# Patient Record
Sex: Male | Born: 1955 | Race: White | Hispanic: No | Marital: Married | Smoking: Never smoker
Health system: Southern US, Community
[De-identification: ages and names within clinical notes are randomized; demographics above are authoritative.]

## PROBLEM LIST (undated history)

## (undated) DIAGNOSIS — K5289 Other specified noninfective gastroenteritis and colitis: Secondary | ICD-10-CM

## (undated) DIAGNOSIS — N289 Disorder of kidney and ureter, unspecified: Secondary | ICD-10-CM

## (undated) DIAGNOSIS — M722 Plantar fascial fibromatosis: Secondary | ICD-10-CM

## (undated) DIAGNOSIS — E785 Hyperlipidemia, unspecified: Secondary | ICD-10-CM

## (undated) DIAGNOSIS — I10 Essential (primary) hypertension: Secondary | ICD-10-CM

## (undated) DIAGNOSIS — N529 Male erectile dysfunction, unspecified: Secondary | ICD-10-CM

## (undated) DIAGNOSIS — G5603 Carpal tunnel syndrome, bilateral upper limbs: Secondary | ICD-10-CM

## (undated) DIAGNOSIS — R55 Syncope and collapse: Secondary | ICD-10-CM

## (undated) DIAGNOSIS — N189 Chronic kidney disease, unspecified: Secondary | ICD-10-CM

## (undated) DIAGNOSIS — C649 Malignant neoplasm of unspecified kidney, except renal pelvis: Secondary | ICD-10-CM

## (undated) DIAGNOSIS — H269 Unspecified cataract: Secondary | ICD-10-CM

## (undated) DIAGNOSIS — M19019 Primary osteoarthritis, unspecified shoulder: Secondary | ICD-10-CM

## (undated) DIAGNOSIS — Z905 Acquired absence of kidney: Secondary | ICD-10-CM

## (undated) HISTORY — DX: Plantar fascial fibromatosis: M72.2

## (undated) HISTORY — DX: Male erectile dysfunction, unspecified: N52.9

## (undated) HISTORY — DX: Unspecified cataract: H26.9

## (undated) HISTORY — DX: Malignant neoplasm of unspecified kidney, except renal pelvis: C64.9

## (undated) HISTORY — DX: Chronic kidney disease, unspecified: N18.9

## (undated) HISTORY — DX: Carpal tunnel syndrome, bilateral upper limbs: G56.03

## (undated) HISTORY — DX: Hyperlipidemia, unspecified: E78.5

## (undated) HISTORY — DX: Disorder of kidney and ureter, unspecified: N28.9

## (undated) HISTORY — PX: NEPHRECTOMY: SHX65

## (undated) HISTORY — PX: OTHER SURGICAL HISTORY: SHX169

## (undated) HISTORY — DX: Essential (primary) hypertension: I10

## (undated) HISTORY — PX: VASECTOMY: SHX75

## (undated) HISTORY — DX: Acquired absence of kidney: Z90.5

## (undated) HISTORY — DX: Syncope and collapse: R55

## (undated) HISTORY — DX: Other specified noninfective gastroenteritis and colitis: K52.89

## (undated) HISTORY — DX: Primary osteoarthritis, unspecified shoulder: M19.019

---

## 2002-11-13 ENCOUNTER — Inpatient Hospital Stay (HOSPITAL_COMMUNITY): Admission: RE | Admit: 2002-11-13 | Discharge: 2002-11-18 | Payer: Self-pay | Admitting: Urology

## 2002-11-13 ENCOUNTER — Encounter: Payer: Self-pay | Admitting: Urology

## 2002-11-13 ENCOUNTER — Encounter (INDEPENDENT_AMBULATORY_CARE_PROVIDER_SITE_OTHER): Payer: Self-pay | Admitting: *Deleted

## 2002-11-14 ENCOUNTER — Encounter: Payer: Self-pay | Admitting: Urology

## 2002-11-16 ENCOUNTER — Encounter: Payer: Self-pay | Admitting: Urology

## 2002-12-30 ENCOUNTER — Encounter: Payer: Self-pay | Admitting: Urology

## 2002-12-30 ENCOUNTER — Encounter: Admission: RE | Admit: 2002-12-30 | Discharge: 2002-12-30 | Payer: Self-pay | Admitting: Urology

## 2004-10-17 ENCOUNTER — Ambulatory Visit: Payer: Self-pay | Admitting: Internal Medicine

## 2004-10-20 ENCOUNTER — Ambulatory Visit: Payer: Self-pay | Admitting: Internal Medicine

## 2004-12-25 ENCOUNTER — Ambulatory Visit: Payer: Self-pay | Admitting: Internal Medicine

## 2005-10-16 ENCOUNTER — Ambulatory Visit: Payer: Self-pay | Admitting: Internal Medicine

## 2005-10-22 ENCOUNTER — Ambulatory Visit: Payer: Self-pay | Admitting: Internal Medicine

## 2006-10-15 ENCOUNTER — Ambulatory Visit: Payer: Self-pay | Admitting: Internal Medicine

## 2006-10-15 LAB — CONVERTED CEMR LAB
AST: 22 units/L (ref 0–37)
BUN: 14 mg/dL (ref 6–23)
Basophils Absolute: 0.1 10*3/uL (ref 0.0–0.1)
Basophils Relative: 1 % (ref 0.0–1.0)
Bilirubin Urine: NEGATIVE
Bilirubin, Direct: 0.1 mg/dL (ref 0.0–0.3)
CO2: 31 meq/L (ref 19–32)
Chloride: 105 meq/L (ref 96–112)
Cholesterol: 188 mg/dL (ref 0–200)
Creatinine, Ser: 1.3 mg/dL (ref 0.4–1.5)
Eosinophils Absolute: 0.1 10*3/uL (ref 0.0–0.6)
Eosinophils Relative: 2.2 % (ref 0.0–5.0)
GFR calc Af Amer: 75 mL/min
GFR calc non Af Amer: 62 mL/min
Glucose, Bld: 100 mg/dL — ABNORMAL HIGH (ref 70–99)
HCT: 45.2 % (ref 39.0–52.0)
HDL: 35.5 mg/dL — ABNORMAL LOW (ref >39.0)
Hemoglobin, Urine: NEGATIVE
Hemoglobin: 15.4 g/dL (ref 13.0–17.0)
Ketones, ur: NEGATIVE mg/dL
Leukocytes, UA: NEGATIVE
Lymphocytes Relative: 35.5 % (ref 12.0–46.0)
MCHC: 34.1 g/dL (ref 30.0–36.0)
MCV: 87.6 fL (ref 78.0–100.0)
Monocytes Absolute: 0.6 10*3/uL (ref 0.2–0.7)
Monocytes Relative: 9.7 % (ref 3.0–11.0)
Neutro Abs: 2.9 10*3/uL (ref 1.4–7.7)
Neutrophils Relative %: 51.6 % (ref 43.0–77.0)
Nitrite: NEGATIVE
PSA: 1.34 ng/mL (ref 0.10–4.00)
Platelets: 263 10*3/uL (ref 150–400)
Potassium: 3.9 meq/L (ref 3.5–5.1)
RDW: 12.1 % (ref 11.5–14.6)
Sodium: 141 meq/L (ref 135–145)
Specific Gravity, Urine: >=1.03 (ref 1.000–1.03)
TSH: 2.52 microintl units/mL (ref 0.35–5.50)
Total Bilirubin: 0.8 mg/dL (ref 0.3–1.2)
Total Protein, Urine: NEGATIVE mg/dL
Total Protein: 6.7 g/dL (ref 6.0–8.3)
Triglycerides: 182 mg/dL — ABNORMAL HIGH (ref 0–149)
Urine Glucose: NEGATIVE mg/dL
Urobilinogen, UA: 0.2 (ref 0.0–1.0)
VLDL: 36 mg/dL (ref 0–40)

## 2006-10-23 ENCOUNTER — Ambulatory Visit: Payer: Self-pay | Admitting: Internal Medicine

## 2007-06-24 ENCOUNTER — Encounter: Payer: Self-pay | Admitting: Internal Medicine

## 2007-06-24 DIAGNOSIS — F528 Other sexual dysfunction not due to a substance or known physiological condition: Secondary | ICD-10-CM | POA: Insufficient documentation

## 2007-06-24 DIAGNOSIS — E7800 Pure hypercholesterolemia, unspecified: Secondary | ICD-10-CM | POA: Insufficient documentation

## 2007-06-24 DIAGNOSIS — G5600 Carpal tunnel syndrome, unspecified upper limb: Secondary | ICD-10-CM

## 2007-09-22 ENCOUNTER — Encounter: Payer: Self-pay | Admitting: Internal Medicine

## 2007-10-20 ENCOUNTER — Ambulatory Visit: Payer: Self-pay | Admitting: Internal Medicine

## 2007-10-20 LAB — CONVERTED CEMR LAB
ALT: 22 units/L (ref 0–53)
AST: 23 units/L (ref 0–37)
Albumin: 4.1 g/dL (ref 3.5–5.2)
Alkaline Phosphatase: 80 units/L (ref 39–117)
BUN: 17 mg/dL (ref 6–23)
Basophils Absolute: 0.1 10*3/uL (ref 0.0–0.1)
Basophils Relative: 0.9 % (ref 0.0–1.0)
Bilirubin Urine: NEGATIVE
Bilirubin, Direct: 0.1 mg/dL (ref 0.0–0.3)
CO2: 31 meq/L (ref 19–32)
Calcium: 9.5 mg/dL (ref 8.4–10.5)
Chloride: 103 meq/L (ref 96–112)
Cholesterol: 196 mg/dL (ref 0–200)
Creatinine, Ser: 1.4 mg/dL (ref 0.4–1.5)
Direct LDL: 128.3 mg/dL
Eosinophils Absolute: 0.1 10*3/uL (ref 0.0–0.6)
Eosinophils Relative: 2.1 % (ref 0.0–5.0)
GFR calc Af Amer: 69 mL/min
GFR calc non Af Amer: 57 mL/min
Glucose, Bld: 104 mg/dL — ABNORMAL HIGH (ref 70–99)
HCT: 45.4 % (ref 39.0–52.0)
HDL: 32.5 mg/dL — ABNORMAL LOW (ref >39.0)
Hemoglobin, Urine: NEGATIVE
Hemoglobin: 15.2 g/dL (ref 13.0–17.0)
Ketones, ur: NEGATIVE mg/dL
Leukocytes, UA: NEGATIVE
Lymphocytes Relative: 23.5 % (ref 12.0–46.0)
MCHC: 33.5 g/dL (ref 30.0–36.0)
MCV: 88.4 fL (ref 78.0–100.0)
Monocytes Absolute: 0.7 10*3/uL (ref 0.2–0.7)
Monocytes Relative: 9.6 % (ref 3.0–11.0)
Neutro Abs: 4.5 10*3/uL (ref 1.4–7.7)
Neutrophils Relative %: 63.9 % (ref 43.0–77.0)
Nitrite: NEGATIVE
PSA: 1.49 ng/mL (ref 0.10–4.00)
Platelets: 257 10*3/uL (ref 150–400)
Potassium: 4.2 meq/L (ref 3.5–5.1)
RBC: 5.13 M/uL (ref 4.22–5.81)
RDW: 12 % (ref 11.5–14.6)
Sodium: 141 meq/L (ref 135–145)
Specific Gravity, Urine: >=1.03 (ref 1.000–1.03)
TSH: 1.86 microintl units/mL (ref 0.35–5.50)
Total Bilirubin: 0.9 mg/dL (ref 0.3–1.2)
Total CHOL/HDL Ratio: 6
Total Protein, Urine: NEGATIVE mg/dL
Total Protein: 7 g/dL (ref 6.0–8.3)
Triglycerides: 202 mg/dL (ref 0–149)
Urine Glucose: NEGATIVE mg/dL
Urobilinogen, UA: 0.2 (ref 0.0–1.0)
VLDL: 40 mg/dL (ref 0–40)
WBC: 7.1 10*3/uL (ref 4.5–10.5)
pH: 6 (ref 5.0–8.0)

## 2007-10-27 ENCOUNTER — Ambulatory Visit: Payer: Self-pay | Admitting: Internal Medicine

## 2007-10-27 DIAGNOSIS — N259 Disorder resulting from impaired renal tubular function, unspecified: Secondary | ICD-10-CM | POA: Insufficient documentation

## 2007-10-27 DIAGNOSIS — M25519 Pain in unspecified shoulder: Secondary | ICD-10-CM

## 2007-10-27 DIAGNOSIS — E785 Hyperlipidemia, unspecified: Secondary | ICD-10-CM

## 2007-10-27 DIAGNOSIS — I10 Essential (primary) hypertension: Secondary | ICD-10-CM | POA: Insufficient documentation

## 2007-10-28 ENCOUNTER — Encounter (INDEPENDENT_AMBULATORY_CARE_PROVIDER_SITE_OTHER): Payer: Self-pay | Admitting: *Deleted

## 2007-11-03 ENCOUNTER — Encounter: Payer: Self-pay | Admitting: Internal Medicine

## 2007-11-26 ENCOUNTER — Ambulatory Visit: Payer: Self-pay | Admitting: Gastroenterology

## 2007-11-28 ENCOUNTER — Ambulatory Visit (HOSPITAL_BASED_OUTPATIENT_CLINIC_OR_DEPARTMENT_OTHER): Admission: RE | Admit: 2007-11-28 | Discharge: 2007-11-28 | Payer: Self-pay | Admitting: Orthopedic Surgery

## 2007-12-10 LAB — HM COLONOSCOPY: HM Colonoscopy: ABNORMAL

## 2007-12-15 ENCOUNTER — Encounter: Payer: Self-pay | Admitting: Internal Medicine

## 2007-12-15 ENCOUNTER — Encounter: Payer: Self-pay | Admitting: Gastroenterology

## 2007-12-15 ENCOUNTER — Ambulatory Visit: Payer: Self-pay | Admitting: Gastroenterology

## 2007-12-17 ENCOUNTER — Encounter: Payer: Self-pay | Admitting: Internal Medicine

## 2007-12-17 DIAGNOSIS — K5289 Other specified noninfective gastroenteritis and colitis: Secondary | ICD-10-CM

## 2007-12-17 HISTORY — DX: Other specified noninfective gastroenteritis and colitis: K52.89

## 2008-04-21 ENCOUNTER — Telehealth (INDEPENDENT_AMBULATORY_CARE_PROVIDER_SITE_OTHER): Payer: Self-pay | Admitting: *Deleted

## 2008-10-22 ENCOUNTER — Ambulatory Visit: Payer: Self-pay | Admitting: Internal Medicine

## 2008-10-22 LAB — CONVERTED CEMR LAB
Albumin: 3.9 g/dL (ref 3.5–5.2)
Alkaline Phosphatase: 86 units/L (ref 39–117)
BUN: 18 mg/dL (ref 6–23)
Basophils Absolute: 0.1 10*3/uL (ref 0.0–0.1)
Basophils Relative: 0.7 % (ref 0.0–3.0)
Bilirubin Urine: NEGATIVE
CO2: 31 meq/L (ref 19–32)
Calcium: 9.4 mg/dL (ref 8.4–10.5)
Chloride: 102 meq/L (ref 96–112)
Cholesterol: 151 mg/dL (ref 0–200)
Creatinine, Ser: 1.3 mg/dL (ref 0.4–1.5)
Eosinophils Absolute: 0.2 10*3/uL (ref 0.0–0.7)
Eosinophils Relative: 2.1 % (ref 0.0–5.0)
GFR calc Af Amer: 75 mL/min
GFR calc non Af Amer: 62 mL/min
Glucose, Bld: 101 mg/dL — ABNORMAL HIGH (ref 70–99)
HCT: 43.4 % (ref 39.0–52.0)
HDL: 36.2 mg/dL — ABNORMAL LOW (ref >39.0)
Hemoglobin, Urine: NEGATIVE
Hemoglobin: 15 g/dL (ref 13.0–17.0)
Ketones, ur: NEGATIVE mg/dL
LDL Cholesterol: 84 mg/dL (ref 0–99)
Leukocytes, UA: NEGATIVE
MCHC: 34.7 g/dL (ref 30.0–36.0)
MCV: 88.2 fL (ref 78.0–100.0)
Monocytes Absolute: 0.8 10*3/uL (ref 0.1–1.0)
Neutro Abs: 4.8 10*3/uL (ref 1.4–7.7)
Nitrite: NEGATIVE
PSA: 1.16 ng/mL (ref 0.10–4.00)
Platelets: 243 10*3/uL (ref 150–400)
Potassium: 4.1 meq/L (ref 3.5–5.1)
RBC: 4.92 M/uL (ref 4.22–5.81)
RDW: 12.1 % (ref 11.5–14.6)
Sodium: 140 meq/L (ref 135–145)
Specific Gravity, Urine: >=1.03 (ref 1.000–1.03)
TSH: 2.21 microintl units/mL (ref 0.35–5.50)
Total Bilirubin: 1.1 mg/dL (ref 0.3–1.2)
Total Protein: 6.9 g/dL (ref 6.0–8.3)
Triglycerides: 153 mg/dL — ABNORMAL HIGH (ref 0–149)
Urine Glucose: NEGATIVE mg/dL
Urobilinogen, UA: 0.2 (ref 0.0–1.0)
VLDL: 31 mg/dL (ref 0–40)
WBC: 7.6 10*3/uL (ref 4.5–10.5)
pH: 5 (ref 5.0–8.0)

## 2008-10-28 ENCOUNTER — Ambulatory Visit: Payer: Self-pay | Admitting: Internal Medicine

## 2008-10-28 DIAGNOSIS — C649 Malignant neoplasm of unspecified kidney, except renal pelvis: Secondary | ICD-10-CM

## 2009-10-27 ENCOUNTER — Ambulatory Visit: Payer: Self-pay | Admitting: Internal Medicine

## 2009-10-27 LAB — CONVERTED CEMR LAB
ALT: 22 units/L (ref 0–53)
AST: 23 units/L (ref 0–37)
Albumin: 4.1 g/dL (ref 3.5–5.2)
Alkaline Phosphatase: 84 units/L (ref 39–117)
BUN: 15 mg/dL (ref 6–23)
Basophils Absolute: 0 10*3/uL (ref 0.0–0.1)
Basophils Relative: 0.7 % (ref 0.0–3.0)
Bilirubin Urine: NEGATIVE
Bilirubin, Direct: 0.1 mg/dL (ref 0.0–0.3)
CO2: 32 meq/L (ref 19–32)
Calcium: 9.4 mg/dL (ref 8.4–10.5)
Chloride: 104 meq/L (ref 96–112)
Cholesterol: 156 mg/dL (ref 0–200)
Creatinine, Ser: 1.3 mg/dL (ref 0.4–1.5)
Direct LDL: 90.2 mg/dL
Eosinophils Absolute: 0.2 10*3/uL (ref 0.0–0.7)
Eosinophils Relative: 2.9 % (ref 0.0–5.0)
GFR calc non Af Amer: 61.58 mL/min (ref >60)
Glucose, Bld: 95 mg/dL (ref 70–99)
HCT: 46.2 % (ref 39.0–52.0)
HDL: 38.2 mg/dL — ABNORMAL LOW (ref >39.00)
Hemoglobin, Urine: NEGATIVE
Hemoglobin: 15.5 g/dL (ref 13.0–17.0)
Ketones, ur: NEGATIVE mg/dL
Leukocytes, UA: NEGATIVE
Lymphocytes Relative: 32.3 % (ref 12.0–46.0)
Lymphs Abs: 1.9 10*3/uL (ref 0.7–4.0)
MCHC: 33.5 g/dL (ref 30.0–36.0)
MCV: 90.1 fL (ref 78.0–100.0)
Monocytes Absolute: 0.6 10*3/uL (ref 0.1–1.0)
Monocytes Relative: 10.6 % (ref 3.0–12.0)
Neutro Abs: 3.1 10*3/uL (ref 1.4–7.7)
Neutrophils Relative %: 53.5 % (ref 43.0–77.0)
Nitrite: NEGATIVE
PSA: 1.37 ng/mL (ref 0.10–4.00)
Platelets: 240 10*3/uL (ref 150.0–400.0)
Potassium: 4 meq/L (ref 3.5–5.1)
RBC: 5.13 M/uL (ref 4.22–5.81)
RDW: 12.1 % (ref 11.5–14.6)
Sodium: 142 meq/L (ref 135–145)
Specific Gravity, Urine: 1.025 (ref 1.000–1.030)
TSH: 3.05 microintl units/mL (ref 0.35–5.50)
Total Bilirubin: 0.4 mg/dL (ref 0.3–1.2)
Total CHOL/HDL Ratio: 4
Total Protein, Urine: NEGATIVE mg/dL
Total Protein: 7.1 g/dL (ref 6.0–8.3)
Triglycerides: 220 mg/dL — ABNORMAL HIGH (ref 0.0–149.0)
Urine Glucose: NEGATIVE mg/dL
Urobilinogen, UA: 0.2 (ref 0.0–1.0)
VLDL: 44 mg/dL — ABNORMAL HIGH (ref 0.0–40.0)
WBC: 5.8 10*3/uL (ref 4.5–10.5)
pH: 5.5 (ref 5.0–8.0)

## 2009-11-03 ENCOUNTER — Ambulatory Visit: Payer: Self-pay | Admitting: Internal Medicine

## 2010-09-05 ENCOUNTER — Ambulatory Visit
Admission: RE | Admit: 2010-09-05 | Discharge: 2010-09-05 | Payer: Self-pay | Source: Home / Self Care | Attending: Internal Medicine | Admitting: Internal Medicine

## 2010-09-05 ENCOUNTER — Encounter: Payer: Self-pay | Admitting: Internal Medicine

## 2010-09-05 DIAGNOSIS — M722 Plantar fascial fibromatosis: Secondary | ICD-10-CM

## 2010-09-05 DIAGNOSIS — R55 Syncope and collapse: Secondary | ICD-10-CM

## 2010-09-05 DIAGNOSIS — R002 Palpitations: Secondary | ICD-10-CM

## 2010-09-05 HISTORY — DX: Plantar fascial fibromatosis: M72.2

## 2010-09-05 HISTORY — DX: Syncope and collapse: R55

## 2010-09-06 ENCOUNTER — Telehealth (INDEPENDENT_AMBULATORY_CARE_PROVIDER_SITE_OTHER): Payer: Self-pay | Admitting: *Deleted

## 2010-09-13 ENCOUNTER — Ambulatory Visit: Admit: 2010-09-13 | Payer: Self-pay

## 2010-09-14 ENCOUNTER — Ambulatory Visit: Admission: RE | Admit: 2010-09-14 | Discharge: 2010-09-14 | Payer: Self-pay | Source: Home / Self Care

## 2010-09-14 ENCOUNTER — Encounter: Payer: Self-pay | Admitting: Internal Medicine

## 2010-09-19 ENCOUNTER — Telehealth: Payer: Self-pay | Admitting: Internal Medicine

## 2010-09-22 ENCOUNTER — Ambulatory Visit (HOSPITAL_COMMUNITY): Admission: RE | Admit: 2010-09-22 | Payer: Self-pay | Source: Home / Self Care | Admitting: Internal Medicine

## 2010-09-27 ENCOUNTER — Telehealth (INDEPENDENT_AMBULATORY_CARE_PROVIDER_SITE_OTHER): Payer: Self-pay | Admitting: *Deleted

## 2010-09-28 ENCOUNTER — Ambulatory Visit: Admission: RE | Admit: 2010-09-28 | Discharge: 2010-09-28 | Payer: Self-pay | Source: Home / Self Care

## 2010-09-28 ENCOUNTER — Encounter (HOSPITAL_COMMUNITY)
Admission: RE | Admit: 2010-09-28 | Discharge: 2010-10-10 | Payer: Self-pay | Source: Home / Self Care | Attending: Internal Medicine | Admitting: Internal Medicine

## 2010-09-28 ENCOUNTER — Encounter: Payer: Self-pay | Admitting: Internal Medicine

## 2010-09-28 ENCOUNTER — Ambulatory Visit (HOSPITAL_COMMUNITY)
Admission: RE | Admit: 2010-09-28 | Discharge: 2010-09-28 | Payer: Self-pay | Source: Home / Self Care | Attending: Internal Medicine | Admitting: Internal Medicine

## 2010-10-04 ENCOUNTER — Telehealth: Payer: Self-pay | Admitting: Internal Medicine

## 2010-10-10 NOTE — Assessment & Plan Note (Signed)
Summary: CPX/ NWS #   Vital Signs:  Patient profile:   55 year old male Height:      74 inches Weight:      194.75 pounds BMI:     25.09 O2 Sat:      97 % on Room air Temp:     98.5 degrees F oral Pulse rate:   85 / minute BP sitting:   120 / 72  (left arm) Cuff size:   regular  Vitals Entered ByZella Ball Ewing (November 03, 2009 8:42 AM)  O2 Flow:  Room air  Preventive Care Screening  Colonoscopy:    Date:  12/10/2007    Next Due:  12/2017    Results:  abnormal   CC: Adult Physical/RE   CC:  Adult Physical/RE.  History of Present Illness: overall doing well, no complaints, Pt denies CP, sob, doe, wheezing, orthopnea, pnd, worsening LE edema, palps, dizziness or syncope  Pt denies new neuro symptoms such as headache, facial or extremity weakness   no recent wt loss, night sweats.    Preventive Screening-Counseling & Management      Drug Use:  no.    Problems Prior to Update: 1)  Preventive Health Care  (ICD-V70.0) 2)  Renal Cell Cancer  (ICD-189.0) 3)  Colitis  (ICD-558.9) 4)  Carpal Tunnel Syndrome, Right  (ICD-354.0) 5)  Shoulder Pain, Right  (ICD-719.41) 6)  Preventive Health Care  (ICD-V70.0) 7)  Renal Insufficiency  (ICD-588.9) 8)  Hypertension  (ICD-401.9) 9)  Hyperlipidemia  (ICD-272.4) 10)  Observation For Suspected Malignant Neoplasm  (ICD-V71.1) 11)  Routine General Medical Exam@health  Care Facl  (ICD-V70.0) 12)  Carpal Tunnel Syndrome, Bilateral  (ICD-354.0) 13)  Hypercholesterolemia  (ICD-272.0) 14)  Erectile Dysfunction  (ICD-302.72)  Medications Prior to Update: 1)  Viagra 100 Mg  Tabs (Sildenafil Citrate) .Marland Kitchen.. 1 By Mouth Every Other Day As Needed 2)  Cozaar 100 Mg  Tabs (Losartan Potassium) .Marland Kitchen.. 1 By Mouth Once Daily 3)  Pravachol 20 Mg  Tabs (Pravastatin Sodium) .Marland Kitchen.. 1po Once Daily 4)  Ecotrin Low Strength 81 Mg  Tbec (Aspirin) .Marland Kitchen.. 1po Qd  Current Medications (verified): 1)  Viagra 100 Mg  Tabs (Sildenafil Citrate) .Marland Kitchen.. 1 By Mouth Every  Other Day As Needed 2)  Cozaar 100 Mg  Tabs (Losartan Potassium) .Marland Kitchen.. 1 By Mouth Once Daily 3)  Pravachol 20 Mg  Tabs (Pravastatin Sodium) .Marland Kitchen.. 1po Once Daily 4)  Ecotrin Low Strength 81 Mg  Tbec (Aspirin) .Marland Kitchen.. 1po Qd 5)  Fenofibrate 160 Mg Tabs (Fenofibrate) .Marland Kitchen.. 1po Once Daily  Allergies (verified): 1)  ! Pcn  Past History:  Past Medical History: Last updated: 10/28/2008 Hyperlipidemia Hypertension E.D. 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 heart disease grandmother with "liver cancer" father with stroke, TIA,  HTN, and CABG at 54yo  Social History: Reviewed history from 10/27/2007 and no changes required. 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         all otherwise negative per pt -  Physical Exam  General:  alert  and well-developed.   Head:  normocephalic and atraumatic.   Eyes:  vision grossly intact, pupils equal, and pupils round.   Ears:  R ear normal and L ear normal.   Nose:  no external deformity and no nasal discharge.   Mouth:  no gingival abnormalities and pharynx pink and moist.   Neck:  supple and no masses.   Lungs:  normal respiratory effort and normal breath sounds.   Heart:  normal rate and regular rhythm.   Abdomen:  soft, non-tender, and normal bowel sounds.   Msk:  no joint tenderness and no joint swelling.   Extremities:  no edema, no erythema  Neurologic:  cranial nerves II-XII intact and strength normal in all extremities.   Skin:  color normal and no rashes.   Psych:  not anxious appearing and not depressed appearing.     Impression & Recommendations:  Problem # 1:  Preventive Health Care (ICD-V70.0)  Overall doing well, age appropriate education and counseling updated and referral for appropriate preventive services done unless declined, immunizations up to date or declined, diet counseling done if overweight, urged to quit smoking if smokes , most recent labs reviewed and current ordered if appropriate, ecg reviewed or declined (interpretation per ECG scanned in the EMR if done); information regarding Medicare Prevention requirements given if appropriate   Orders: EKG w/ Interpretation (93000)  Problem # 2:  HYPERLIPIDEMIA (ICD-272.4)  His updated medication list for this problem includes:    Pravachol 20 Mg Tabs (Pravastatin sodium) .Marland Kitchen... 1po once daily    Fenofibrate 160 Mg Tabs (Fenofibrate) .Marland Kitchen... 1po once daily to add the generic tricor  Labs Reviewed: SGOT: 23 (10/27/2009)   SGPT: 22 (10/27/2009)   HDL:38.20 (10/27/2009), 36.2 (10/22/2008)  LDL:84 (10/22/2008), DEL (60/45/4098)  Chol:156 (10/27/2009), 151 (10/22/2008)  Trig:220.0 (10/27/2009), 153 (10/22/2008)  Problem # 3:  RENAL CELL CANCER (ICD-189.0)  for f/u cxr today  Orders: T-2 View CXR, Same Day  (71020.5TC)  Complete Medication List: 1)  Viagra 100 Mg Tabs (Sildenafil citrate) .Marland Kitchen.. 1 by mouth every other day as needed 2)  Cozaar 100 Mg Tabs (Losartan potassium) .Marland Kitchen.. 1 by mouth once daily 3)  Pravachol 20 Mg Tabs (Pravastatin sodium) .Marland Kitchen.. 1po once daily 4)  Ecotrin Low Strength 81 Mg Tbec (Aspirin) .Marland Kitchen.. 1po qd 5)  Fenofibrate 160 Mg Tabs (Fenofibrate) .Marland Kitchen.. 1po once daily  Patient Instructions: 1)  Please take all new medications as prescribed 2)  Continue all previous medications as before this visit  3)  Please go to Radiology in the basement level for your X-Ray today  4)  Please schedule a follow-up appointment in 1 year or sooner if needed Prescriptions: VIAGRA 100 MG  TABS (SILDENAFIL CITRATE) 1 by mouth every other day as needed  #5 x 11   Entered and Authorized by:   Corwin Levins MD   Signed by:   Corwin Levins MD on 11/03/2009   Method used:   Print then Give to Patient   RxID:   [id number] MVHQIO  100 MG  TABS (LOSARTAN POTASSIUM) 1 by mouth once daily  #90 x 3   Entered and Authorized by:   Corwin Levins MD   Signed by:   Corwin Levins MD on 11/03/2009   Method used:   Print then Give to Patient   RxID:   [id number] PRAVACHOL 20 MG  TABS (PRAVASTATIN SODIUM) 1po once daily  #90 x 3   Entered and Authorized by:   Corwin Levins MD   Signed by:   Corwin Levins MD on 11/03/2009   Method used:   Print then Give to Patient   RxID:   [id number] FENOFIBRATE 160 MG TABS (FENOFIBRATE) 1po once daily  #90 x 3   Entered and Authorized by:   Corwin Levins MD   Signed by:   Corwin Levins MD on 11/03/2009   Method used:   Print then Give to Patient   RxID:   [id number]

## 2010-10-12 NOTE — Progress Notes (Signed)
Summary: Nuclear Pre-Procedure  Phone Note Outgoing Call Call back at [facility] Phone [phone]   Call placed by: Stanton Kidney, EMT-P,  September 27, 2010 3:42 PM Action Taken: Phone Call Completed Summary of Call: Left message with information on Myoview Information Sheet (see scanned document for details). Stanton Kidney, EMT-P  September 27, 2010 3:42 PM     Nuclear Med Background Indications for Stress Test: Evaluation for Ischemia   History: Myocardial Perfusion Study  History Comments: '05 MPS: NL, EF=55%  Symptoms: Fatigue, Palpitations, Syncope    Nuclear Pre-Procedure Cardiac Risk Factors: Family History - CAD, History of Smoking, Hypertension, Lipids Height (in): 74

## 2010-10-12 NOTE — Assessment & Plan Note (Addendum)
Summary: Cardiology Nuclear Testing  Nuclear Med Background Indications for Stress Test: Evaluation for Ischemia   History: Myocardial Perfusion Study  History Comments: '05 ZOX:WRUEAV, EF=55%  Symptoms: Fatigue, Palpitations, Syncope  Symptoms Comments: Stress. 7/10 Episode of syncope, "related to BP meds." per patient.   Nuclear Pre-Procedure Cardiac Risk Factors: Family History - CAD, History of Smoking, Hypertension, Lipids Caffeine/Decaff Intake: None NPO After: 7:00 PM Lungs: Clear IV 0.9% NS with Angio Cath: 18g     IV Site: R Antecubital IV Started by: Stanton Kidney, EMT-P Chest Size (in) 44     Height (in): 74 Weight (lb): 192 BMI: 24.74 Tech Comments: Has not started metoprolol yet, per patient.   Nuclear Med Study 1 or 2 day study:  1 day     Stress Test Type:  Stress Reading MD:  Dietrich Pates, MD     Referring MD:  Oliver Barre, MD Resting Radionuclide:  Technetium 37m Tetrofosmin     Resting Radionuclide Dose:  11.0 mCi  Stress Radionuclide:  Technetium 38m Tetrofosmin     Stress Radionuclide Dose:  33.0 mCi   Stress Protocol Exercise Time (min):  10:16 min     Max HR:  176 bpm     Predicted Max HR:  167 bpm  Max Systolic BP: 176 mm Hg     Percent Max HR:  105.39 %     METS: 12.2 Rate Pressure Product:  40981    Stress Test Technologist:  [employer], CMA-N     Nuclear Technologist:  Domenic Polite, CNMT  Rest Procedure  Myocardial perfusion imaging was performed at rest 45 minutes following the intravenous administration of Technetium 102m Tetrofosmin.  Stress Procedure  The patient exercised for 10:16 utilizing the Bruce protocol.  The patient stopped due to fatigue and denied any chest pain.  There were no diagnostic ST-T wave changes, only occasional PVC's and rare PAC's.  Technetium 96m Tetrofosmin was injected at peak exercise and myocardial perfusion imaging was performed after a brief delay.  QPS Raw Data Images:  Images were motion corrected.  SOft  tissue (diaphragm) underlies heart. Stress Images:  thinning with decreased counts in the inferiro wall(base, mid).  Otherwise normal perfusion. Rest Images:  Comparison with the stress images reveals no significant change. Subtraction (SDS):  No evidence of ischemia. Transient Ischemic Dilatation:  1.08  (Normal <1.22)  Lung/Heart Ratio:  0.30  (Normal <0.45)  Quantitative Gated Spect Images QGS EDV:  105 ml QGS ESV:  50 ml QGS EF:  53 %   Overall Impression  Exercise Capacity: Excellent exercise capacity. BP Response: Normal blood pressure response. Clinical Symptoms: No chest pain ECG Impression: No significant ST segment change suggestive of ischemia. Overall Impression Comments: Thinning in the inferior wall (base,  mid) consistent with possible soft tissue attenuation though cannot exclude scar.  LVEF calculated at 53%.  No evidence of ischemia.  Would recomm echo to further define wall motion inferiorly.  Overall with patient's exercise capacity very low risk scan.  Appended Document: Cardiology Nuclear Testing Low risk scan  See note above in impression.

## 2010-10-12 NOTE — Progress Notes (Signed)
Summary: holter monitor   Phone Note Outgoing Call   Call placed by: Marcos Eke,  September 06, 2010 11:31 AM Summary of Call: Call patient left messege for him to call back to make appt for holter monitor  Follow-up for Phone Call        Patient has appt. for 09/12/10 at 9 am for holter monitor Follow-up by: Marcos Eke,  September 08, 2010 11:20 AM

## 2010-10-12 NOTE — Progress Notes (Signed)
Phone Note Call from Patient Call back at Home Phone [phone]   Caller: Patient Call For: Brandon Sanders Summary of Call: Pt states he saw Dr Jonny Ruiz in December for appt. He set up CPX for April (next available) Pt will run out of all meds before then.Pt also wants stress test done. Please advise/ Initial call taken by: Verdell Face,  September 19, 2010 1:02 PM  Follow-up for Phone Call        ok for med refills - to robin  ok for echo and stress test  with recent syncope Follow-up by: Brandon Sanders,  September 19, 2010 2:57 PM  Additional Follow-up for Phone Call Additional follow up Details #1::        called pt left msg. to call back Additional Follow-up by: Robin Ewing CMA Duncan Dull),  September 19, 2010 3:58 PM    Additional Follow-up for Phone Call Additional follow up Details #2::    Called pt. informed of above information. Sent refills to [facility] as pt. requested, Viagra refill pt. requested sent to [facility]. Also pt. requested his contact number be changed to [phone].  Follow-up by: Zella Ball Ewing CMA (AAMA),  September 20, 2010 8:28 AM  Prescriptions: VIAGRA 100 MG  TABS (SILDENAFIL CITRATE) 1 by mouth every other day as needed  #5 x 11   Entered by:   Zella Ball Ewing CMA (AAMA)   Authorized by:   Brandon Sanders   Signed by:   Scharlene Gloss CMA (AAMA) on 09/20/2010   Method used:   Electronically to        [id number] (retail)       [address]       [place], [place]  [address]       Ph: [phone] or [phone]       Fax: [phone]   RxID:   [id number] FENOFIBRATE 160 MG TABS (FENOFIBRATE) 1po once daily  #90 x 3   Entered by:   Scharlene Gloss CMA (AAMA)   Authorized by:   Brandon Sanders   Signed by:   Scharlene Gloss CMA (AAMA) on 09/20/2010   Method used:   Electronically to        [facility] [phone]* (retail)       [address]       [place], [place]  [address]       Ph: [phone] or  [phone]       Fax: [phone]   RxID:   [id number] PRAVACHOL 20 MG  TABS (PRAVASTATIN SODIUM) 1po once daily  #90 x 3   Entered by:   Scharlene Gloss CMA (AAMA)   Authorized by:   Brandon Sanders   Signed by:   Scharlene Gloss CMA (AAMA) on 09/20/2010   Method used:   Electronically to        [facility] [phone]* (retail)       [address]       [place], [place]  [address]       Ph: [phone] or [phone]       Fax: [phone]   RxID:   [id number] METOPROLOL SUCCINATE 50 MG XR24H-TAB (METOPROLOL SUCCINATE) 1 by mouth once daily  #90 x 3   Entered by:   Scharlene Gloss CMA (AAMA)   Authorized by:   Brandon Sanders   Signed by:  Robin Ewing CMA (AAMA) on 09/20/2010   Method used:   [facility]ically to        [facility]       [address]       [place], [place]  [address]       Ph: [phone] or [phone]       Fax: [phone]   RxID:   [id number]

## 2010-10-12 NOTE — Progress Notes (Signed)
Summary: Echo and Stress test  Phone Note Call from Patient Call back at Work Phone [phone]   Caller: Patient Call For: Corwin Levins MD Summary of Call: Patient called requesting results from Echo and Stress Test as has not heard results. Call back number [phone] Initial call taken by: Robin Ewing CMA Duncan Dull),  October 04, 2010 1:29 PM  Follow-up for Phone Call        both should be on PT - stress test normal, and echo as documented Follow-up by: Corwin Levins MD,  October 04, 2010 1:57 PM  Additional Follow-up for Phone Call Additional follow up Details #1::        called pt. informed of above information Additional Follow-up by: Robin Ewing CMA Duncan Dull),  October 04, 2010 2:09 PM

## 2010-10-12 NOTE — Assessment & Plan Note (Signed)
Summary: FEELS LIKE IRRG HEART BEAT/NWS   Vital Signs:  Patient profile:   55 year old male Height:      74 inches Weight:      194.13 pounds BMI:     25.01 O2 Sat:      98 % on Room air Temp:     98.1 degrees F oral Pulse rate:   75 / minute BP sitting:   136 / 86  (left arm) Cuff size:   regular  Vitals Entered By: Zella Ball Ewing CMA Duncan Dull) (September 05, 2010 9:07 AM)  O2 Flow:  Room air CC: Irregular heartbeat and SOB/RE   CC:  Irregular heartbeat and SOB/RE.  History of Present Illness: here with 6 wks onset palpitations , noted a mised beat per pt every 6 to 12 beats  by radial artery palpation, wife is RN who listened at the same time and heard faint beats  when he noted the missed beats peripherally;  symptoms assoc with sob and cough, and usually occurs with more sendentary activity (with the lower heart rates) and seemed to resolve with begin more active such as light housework/vaccuming yesterday;  Pt denies CP,  doe, wheezing, orthopnea, pnd, worsening LE edema, dizziness or syncope , except for dizziness with the 100 mg cozaar so that he had to begin taking Half in July 2011 after an episode of syncope with BP 90 after 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  BP since then with SBP in the 120's.   Pt denies new neuro symptoms such as headache, facial or extremity weakness . Pt denies polydipsia, polyuria.   No overt bleeding or bruising.   Also with "bottom pad of the right foot swollen" and plantar aspets hurts, worse to first getting up in the AM with first few steps.  Some increased fatigue this yr, more difficutlly gettting to sleep this yr, more job stress this yr as he has taken on some different responsbilities.      Problems Prior to Update: 1)  Plantar Fasciitis  (ICD-728.71) 2)  Syncope  (ICD-780.2) 3)  Palpitations  (ICD-785.1) 4)  Preventive Health Care  (ICD-V70.0) 5)   Renal Cell Cancer  (ICD-189.0) 6)  Colitis  (ICD-558.9) 7)  Carpal Tunnel Syndrome, Right  (ICD-354.0) 8)  Shoulder Pain, Right  (ICD-719.41) 9)  Preventive Health Care  (ICD-V70.0) 10)  Renal Insufficiency  (ICD-588.9) 11)  Hypertension  (ICD-401.9) 12)  Hyperlipidemia  (ICD-272.4) 13)  Observation For Suspected Malignant Neoplasm  (ICD-V71.1) 14)  Routine General Medical Exam@health  Care Facl  (ICD-V70.0) 15)  Carpal Tunnel Syndrome, Bilateral  (ICD-354.0) 16)  Hypercholesterolemia  (ICD-272.0) 17)  Erectile Dysfunction  (ICD-302.72)  Medications Prior to Update: 1)  Viagra 100 Mg  Tabs (Sildenafil Citrate) .Marland Kitchen.. 1 By Mouth Every Other Day As Needed 2)  Cozaar 100 Mg  Tabs (Losartan Potassium) .Marland Kitchen.. 1 By Mouth Once Daily 3)  Pravachol 20 Mg  Tabs (Pravastatin Sodium) .Marland Kitchen.. 1po Once Daily 4)  Ecotrin Low Strength 81 Mg  Tbec (Aspirin) .Marland Kitchen.. 1po Qd 5)  Fenofibrate 160 Mg Tabs (Fenofibrate) .Marland Kitchen.. 1po Once Daily  Current Medications (verified): 1)  Viagra 100 Mg  Tabs (Sildenafil Citrate) .Marland Kitchen.. 1 By Mouth Every Other Day As Needed 2)  Cozaar 100 Mg  Tabs (Losartan Potassium) .... 1/2 By Mouth Once Daily 3)  Pravachol 20 Mg  Tabs (Pravastatin Sodium) .Marland Kitchen.. 1po Once Daily  4)  Ecotrin Low Strength 81 Mg  Tbec (Aspirin) .Marland Kitchen.. 1po Qd 5)  Fenofibrate 160 Mg Tabs (Fenofibrate) .Marland Kitchen.. 1po Once Daily  Allergies (verified): 1)  ! Pcn  Past History:  Past Medical History: Last updated: 10/28/2008 Hyperlipidemia Hypertension E.D. 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   Head:  normocephalic and atraumatic.   Eyes:  vision grossly intact, pupils equal, and pupils round.   Ears:  R ear normal and L ear normal.   Nose:  no external deformity and no nasal discharge.   Mouth:  no gingival abnormalities and pharynx pink and moist.   Neck:  supple and no masses.   Lungs:  normal respiratory effort and normal breath sounds.   Heart:  normal rate and regular rhythm.   Abdomen:  soft, non-tender, and normal bowel sounds.   Msk:  tender heel bilat wtihout sweling or erythema Extremities:  no edema, no erythema    Impression & Recommendations:  Problem # 1:  PALPITATIONS (ICD-785.1)  ? PAC's or PVC's but cant r/o afib';  occurs dialy - will check holter;  consider cardiology;  consider change cozaar to toprol for symptomatic improvement;  labs from feb 2011 reviewed  Orders: Misc. Referral (Misc. Ref)  Problem # 2:  SYNCOPE (ICD-780.2)  likely related to overcontrolled BP, dehydration with episode 6 mo ago;  declines stress test;  will check echo  Orders: Misc. Referral (Misc. Ref)  Problem # 3:  HYPERTENSION (ICD-401.9)  His updated medication list for this problem includes:    Cozaar 100 Mg Tabs (Losartan potassium) .Marland Kitchen... 1/2 by mouth once daily  BP today: 136/86 Prior BP: 120/72 (11/03/2009)  Labs Reviewed: K+: 4.0 (10/27/2009) Creat: : 1.3 (10/27/2009)   Chol: 156 (10/27/2009)   HDL: 38.20 (10/27/2009)   LDL: 84 (10/22/2008)   TG: 220.0 (10/27/2009) stable overall by hx and exam, ok to continue meds/tx as is   Problem # 4:  PLANTAR FASCIITIS (ICD-728.71) mild to mod - for podiatry referral, soft soled shoes, tylenol as needed  Orders: Podiatry Referral (Podiatry)  Complete Medication List: 1)   Viagra 100 Mg Tabs (Sildenafil citrate) .Marland Kitchen.. 1 by mouth every other day as needed 2)  Cozaar 100 Mg Tabs (Losartan potassium) .... 1/2 by mouth once daily 3)  Pravachol 20 Mg Tabs (Pravastatin sodium) .Marland Kitchen.. 1po once daily 4)  Ecotrin Low Strength 81 Mg Tbec (Aspirin) .Marland Kitchen.. 1po qd 5)  Fenofibrate 160 Mg Tabs (Fenofibrate) .Marland Kitchen.. 1po once daily  Other Orders: EKG w/ Interpretation (93000)  Patient Instructions: 1)  You will be contacted about the referral(s) to: 24 hr Holter monitor and echocardiogram, and podiatry for the feet 2)  Please call if you change your mind about having the stress test 3)  Continue all previous medications as before this visit , including the Half of the cozaar 4)  Please schedule a follow-up appointment  in 2 months for CPX with labs 5)  Depending on the holter results, we may be able to change the cozaar to toprol XL to help the symptoms   Orders Added: 1)  EKG w/ Interpretation [93000] 2)  Podiatry Referral [Podiatry] 3)  Misc. Referral [Misc. Ref] 4)  Est. Patient Level IV [[address]]

## 2010-10-12 NOTE — Procedures (Signed)
Summary: summary report  summary report   Imported By: Mirna Mires 09/18/2010 08:50:54  _____________________________________________________________________  External Attachment:    Type:   Image     Comment:   External Document  Appended Document: summary report LMOPT - Holter did show "extra beats" that come from the upper and lower part of the heart, but are benign      1)  if he still has symtpoms and wants, we can try to change the losartan to metoprolol which would treat BP and the extra beats , but if he is ok with the way things are, we can leave his meds alone  robin - to call pt to inform  Appended Document: summary report called pt left msg. to call back  Appended Document: summary report called pt. left msg. to call back  Appended Document: summary report called pt informed of above information. He still is having symptoms and would like to change Meds. as the extra beats does bother him. His pharmacy is [facility].

## 2010-12-12 ENCOUNTER — Other Ambulatory Visit (INDEPENDENT_AMBULATORY_CARE_PROVIDER_SITE_OTHER): Payer: BC Managed Care – PPO

## 2010-12-12 DIAGNOSIS — Z0000 Encounter for general adult medical examination without abnormal findings: Secondary | ICD-10-CM

## 2010-12-12 DIAGNOSIS — Z0389 Encounter for observation for other suspected diseases and conditions ruled out: Secondary | ICD-10-CM

## 2010-12-12 LAB — URINALYSIS
Leukocytes, UA: NEGATIVE
Nitrite: NEGATIVE
Specific Gravity, Urine: 1.025 (ref 1.000–1.030)
Total Protein, Urine: NEGATIVE
pH: 6 (ref 5.0–8.0)

## 2010-12-12 LAB — CBC WITH DIFFERENTIAL/PLATELET
Basophils Absolute: 0 10*3/uL (ref 0.0–0.1)
Eosinophils Relative: 1.9 % (ref 0.0–5.0)
Hemoglobin: 14.3 g/dL (ref 13.0–17.0)
Lymphocytes Relative: 22.2 % (ref 12.0–46.0)
Monocytes Relative: 9.3 % (ref 3.0–12.0)
Neutro Abs: 5.1 10*3/uL (ref 1.4–7.7)
RDW: 13.4 % (ref 11.5–14.6)
WBC: 7.7 10*3/uL (ref 4.5–10.5)

## 2010-12-13 LAB — LIPID PANEL
Cholesterol: 137 mg/dL (ref 0–200)
HDL: 36.5 mg/dL — ABNORMAL LOW (ref >39.00)
Triglycerides: 82 mg/dL (ref 0.0–149.0)
VLDL: 16.4 mg/dL (ref 0.0–40.0)

## 2010-12-13 LAB — HEPATIC FUNCTION PANEL
ALT: 19 U/L (ref 0–53)
Albumin: 4 g/dL (ref 3.5–5.2)
Total Protein: 6.9 g/dL (ref 6.0–8.3)

## 2010-12-13 LAB — BASIC METABOLIC PANEL
GFR: 46.57 mL/min — ABNORMAL LOW (ref >60.00)
Potassium: 4.4 mEq/L (ref 3.5–5.1)
Sodium: 142 mEq/L (ref 135–145)

## 2010-12-13 LAB — TSH: TSH: 2.38 u[IU]/mL (ref 0.35–5.50)

## 2010-12-18 ENCOUNTER — Encounter: Payer: Self-pay | Admitting: Internal Medicine

## 2010-12-21 ENCOUNTER — Encounter: Payer: Self-pay | Admitting: Internal Medicine

## 2010-12-21 DIAGNOSIS — N529 Male erectile dysfunction, unspecified: Secondary | ICD-10-CM | POA: Insufficient documentation

## 2010-12-21 DIAGNOSIS — M19019 Primary osteoarthritis, unspecified shoulder: Secondary | ICD-10-CM | POA: Insufficient documentation

## 2010-12-21 DIAGNOSIS — I10 Essential (primary) hypertension: Secondary | ICD-10-CM | POA: Insufficient documentation

## 2010-12-21 DIAGNOSIS — Z905 Acquired absence of kidney: Secondary | ICD-10-CM | POA: Insufficient documentation

## 2010-12-21 DIAGNOSIS — E785 Hyperlipidemia, unspecified: Secondary | ICD-10-CM | POA: Insufficient documentation

## 2010-12-21 DIAGNOSIS — N189 Chronic kidney disease, unspecified: Secondary | ICD-10-CM | POA: Insufficient documentation

## 2010-12-21 DIAGNOSIS — G5603 Carpal tunnel syndrome, bilateral upper limbs: Secondary | ICD-10-CM | POA: Insufficient documentation

## 2010-12-21 DIAGNOSIS — N182 Chronic kidney disease, stage 2 (mild): Secondary | ICD-10-CM | POA: Insufficient documentation

## 2010-12-21 DIAGNOSIS — C649 Malignant neoplasm of unspecified kidney, except renal pelvis: Secondary | ICD-10-CM | POA: Insufficient documentation

## 2010-12-22 ENCOUNTER — Encounter: Payer: Self-pay | Admitting: Internal Medicine

## 2010-12-22 DIAGNOSIS — Z0000 Encounter for general adult medical examination without abnormal findings: Secondary | ICD-10-CM | POA: Insufficient documentation

## 2010-12-26 ENCOUNTER — Ambulatory Visit (INDEPENDENT_AMBULATORY_CARE_PROVIDER_SITE_OTHER)
Admission: RE | Admit: 2010-12-26 | Discharge: 2010-12-26 | Disposition: A | Discharge status: Home / Self Care | Payer: BC Managed Care – PPO | Source: Ambulatory Visit | Attending: Internal Medicine | Admitting: Internal Medicine

## 2010-12-26 ENCOUNTER — Ambulatory Visit (INDEPENDENT_AMBULATORY_CARE_PROVIDER_SITE_OTHER): Payer: BC Managed Care – PPO | Admitting: Internal Medicine

## 2010-12-26 ENCOUNTER — Encounter: Payer: Self-pay | Admitting: Internal Medicine

## 2010-12-26 VITALS — BP 122/70 | HR 74 | Temp 97.8°F | Ht 74.0 in | Wt 195.4 lb

## 2010-12-26 DIAGNOSIS — C649 Malignant neoplasm of unspecified kidney, except renal pelvis: Secondary | ICD-10-CM

## 2010-12-26 DIAGNOSIS — I493 Ventricular premature depolarization: Secondary | ICD-10-CM

## 2010-12-26 DIAGNOSIS — N289 Disorder of kidney and ureter, unspecified: Secondary | ICD-10-CM

## 2010-12-26 DIAGNOSIS — I4949 Other premature depolarization: Secondary | ICD-10-CM

## 2010-12-26 DIAGNOSIS — Z0000 Encounter for general adult medical examination without abnormal findings: Secondary | ICD-10-CM

## 2010-12-26 DIAGNOSIS — I10 Essential (primary) hypertension: Secondary | ICD-10-CM

## 2010-12-26 MED ORDER — METOPROLOL SUCCINATE ER 50 MG PO TB24: ORAL_TABLET | ORAL | Status: DC

## 2010-12-26 MED ORDER — TETANUS-DIPHTH-ACELL PERTUSSIS 5-2.5-18.5 LF-MCG/0.5 IM SUSP
0.5000 mL | Freq: Once | INTRAMUSCULAR | Status: AC
  Administered 2010-12-26: 0.5 mL via INTRAMUSCULAR

## 2010-12-26 NOTE — Assessment & Plan Note (Signed)
Overall doing well, age appropriate education and counseling updated, referrals for preventative services and immunizations addressed, dietary and smoking counseling addressed, most recent labs and ECG reviewed.  I have personally reviewed and have noted: 1) the patient's medical and social history 2) The pt's use of alcohol, tobacco, and illicit drugs 3) The patient's current medications and supplements 4) Functional ability including ADL's, fall risk, home safety risk, hearing and visual impairment 5) Diet and physical activities 6) Evidence for depression or mood disorder 7) The patient's height, weight, and BMI have been recorded in the chart I have made referrals, and provided counseling and education based on review of the above Also due for tetanus today

## 2010-12-26 NOTE — Progress Notes (Signed)
Quick Note:  Voice message left on [employer] system - lab is negative, normal or otherwise stable, pt to continue same tx ______ 

## 2010-12-26 NOTE — Assessment & Plan Note (Signed)
stable overall by hx and exam, most recent lab reviewed with pt, and pt to continue medical treatment as before except toprol xl increased as above

## 2010-12-26 NOTE — Progress Notes (Signed)
Subjective:    Patient ID: Brandon Sanders, male    DOB: [birth year], 55 y.o.   MRN: [id number]  HPI Here for wellness and f/u;  Overall doing ok;  Pt denies CP, worsening SOB, DOE, wheezing, orthopnea, PND, worsening LE edema, dizziness or syncope.  Pt denies neurological change such as new Headache, facial or extremity weakness.  Pt denies polydipsia, polyuria, or low sugar symptoms. Pt states overall good compliance with treatment and medications, good tolerability, and trying to follow lower cholesterol diet.  Pt denies worsening depressive symptoms, suicidal ideation or panic. No fever, wt loss, night sweats, loss of appetite, or other constitutional symptoms.  Pt states good ability with ADL's, low fall risk, home safety reviewed and adequate, no significant changes in hearing or vision, and occasionally active with exercise. Does have occasional uncontrolled palpiations on current dosing beta blocker.   Past Medical History  Diagnosis Date  . Hyperlipidemia   . HTN (hypertension)   . Erectile dysfunction   . Carpal tunnel syndrome, bilateral   . Chronic renal insufficiency   . Solitary kidney, acquired   . Renal cell cancer   . DJD of shoulder   . RENAL CELL CANCER 10/28/2008  . HYPERCHOLESTEROLEMIA 06/24/2007  . HYPERTENSION 10/27/2007  . SYNCOPE 09/05/2010  . COLITIS 12/17/2007  . ERECTILE DYSFUNCTION 06/24/2007  . Palpitations 09/05/2010  . PLANTAR FASCIITIS 09/05/2010   Past Surgical History  Procedure Date  . Nephrectomy     Right  . S.p right cts release   . S/p right ulnar nerve release     reports that he has never smoked. He does not have any smokeless tobacco history on file. He reports that he does not drink alcohol or use illicit drugs. family history includes Heart disease in his other; Hypertension (age of onset:40) in his father; Liver cancer in his maternal grandmother; Stroke in his father; and Transient ischemic attack in his father. Allergies  Allergen Reactions    . Penicillins    Current Outpatient Prescriptions on File Prior to Visit  Medication Sig Dispense Refill  . aspirin 81 MG EC tablet Take 81 mg by mouth daily.        . fenofibrate 160 MG tablet Take 160 mg by mouth daily.        . pravastatin (PRAVACHOL) 20 MG tablet Take 20 mg by mouth daily.        . sildenafil (VIAGRA) 100 MG tablet Take 100 mg by mouth daily as needed. 1 by mouth every other day as needed        Review of Systems Review of Systems  Constitutional: Negative for diaphoresis, activity change, appetite change and unexpected weight change.  HENT: Negative for hearing loss, ear pain, facial swelling, mouth sores and neck stiffness.   Eyes: Negative for pain, redness and visual disturbance.  Respiratory: Negative for shortness of breath and wheezing.   Cardiovascular: Negative for chest pain and palpitations.  Gastrointestinal: Negative for diarrhea, blood in stool, abdominal distention and rectal pain.  Genitourinary: Negative for hematuria, flank pain and decreased urine volume.  Musculoskeletal: Negative for myalgias and joint swelling.  Skin: Negative for color change and wound.  Neurological: Negative for syncope and numbness.  Hematological: Negative for adenopathy.  Psychiatric/Behavioral: Negative for hallucinations, self-injury, decreased concentration and agitation.      Objective:   Physical Exam BP 122/70  Pulse 74  Temp(Src) 97.8 F (36.6 C) (Oral)  Ht 6\' 2"  (1.88 m)  Wt 195 lb 6 oz (  88.622 kg)  BMI 25.08 kg/m2  SpO2 97% Physical Exam  VS noted Constitutional: Pt is oriented to person, place, and time. Appears well-developed and well-nourished.  HENT:  Head: Normocephalic and atraumatic.  Right Ear: External ear normal.  Left Ear: External ear normal.  Nose: Nose normal.  Mouth/Throat: Oropharynx is clear and moist.  Eyes: Conjunctivae and EOM are normal. Pupils are equal, round, and reactive to light.  Neck: Normal range of motion. Neck  supple. No JVD present. No tracheal deviation present.  Cardiovascular: Normal rate, regular rhythm, normal heart sounds and intact distal pulses.   Pulmonary/Chest: Effort normal and breath sounds normal.  Abdominal: Soft. Bowel sounds are normal. There is no tenderness.  Musculoskeletal: Normal range of motion. Exhibits no edema.  Lymphadenopathy:  Has no cervical adenopathy.  Neurological: Pt is alert and oriented to person, place, and time. Pt has normal reflexes. No cranial nerve deficit.  Skin: Skin is warm and dry. No rash noted.  Psychiatric:  Has  normal mood and affect. Behavior is normal.         Assessment & Plan:

## 2010-12-26 NOTE — Assessment & Plan Note (Signed)
With increased symptoms with personal stressors as well post-meals;  Will increase the toprl xl to 75 per day,  to f/u any worsening symptoms or concerns, consider card referral for further eval and tx  In 1-2 wks as this is quite distressing to him

## 2010-12-26 NOTE — Assessment & Plan Note (Signed)
Worse cr in the past yr for unclear reasons, for renal u/s, also refer to renal for further eval /tx especially in the event of solitary kidney

## 2010-12-26 NOTE — Patient Instructions (Addendum)
Increase the metoprolol to 75 mg per day (one and 1/2 of the 50 mg pills) - new rx sent to the pharmacy Continue all other medications as before Please go to XRAY in the Basement for the x-ray test You will be contacted regarding the referral for: kidney ultrasound, renal referral (dr fox if able) Please call in one week for cardiology referral if the increased metoprolol does not help You had the tetanus shot today (other wise up to date) Please return in 1 year for your yearly visit, or sooner if needed, with Lab testing done 3-5 days before

## 2011-01-05 ENCOUNTER — Encounter: Payer: BC Managed Care – PPO | Admitting: Cardiology

## 2011-01-05 ENCOUNTER — Telehealth: Payer: Self-pay

## 2011-01-05 DIAGNOSIS — N289 Disorder of kidney and ureter, unspecified: Secondary | ICD-10-CM

## 2011-01-05 NOTE — Telephone Encounter (Signed)
Ok for bmet 

## 2011-01-05 NOTE — Telephone Encounter (Signed)
Pt called stating that he cannot afford kidney ultrasound at this time and there is a wait for appt at [facility]. Pt is requesting to repeat labs w creatinine levels while waiting for appt.

## 2011-01-07 ENCOUNTER — Encounter: Payer: Self-pay | Admitting: Internal Medicine

## 2011-01-07 NOTE — Assessment & Plan Note (Signed)
Also for cxr f/u

## 2011-01-08 NOTE — Telephone Encounter (Signed)
Pt advised that lab was ordered

## 2011-01-10 ENCOUNTER — Other Ambulatory Visit (INDEPENDENT_AMBULATORY_CARE_PROVIDER_SITE_OTHER): Payer: BC Managed Care – PPO

## 2011-01-10 DIAGNOSIS — N289 Disorder of kidney and ureter, unspecified: Secondary | ICD-10-CM

## 2011-01-10 LAB — BASIC METABOLIC PANEL
CO2: 31 mEq/L (ref 19–32)
Calcium: 9.5 mg/dL (ref 8.4–10.5)
Creatinine, Ser: 1.6 mg/dL — ABNORMAL HIGH (ref 0.4–1.5)
GFR: 46.88 mL/min — ABNORMAL LOW (ref >60.00)
Sodium: 140 mEq/L (ref 135–145)

## 2011-01-15 ENCOUNTER — Telehealth: Payer: Self-pay | Admitting: *Deleted

## 2011-01-15 DIAGNOSIS — R002 Palpitations: Secondary | ICD-10-CM

## 2011-01-15 DIAGNOSIS — I10 Essential (primary) hypertension: Secondary | ICD-10-CM

## 2011-01-15 NOTE — Telephone Encounter (Signed)
1. Patient requesting a call from MD - He has not heard anything from [facility] 2. Patient has increased BP med per MD's advisement and there have been not change in palpitations.

## 2011-01-15 NOTE — Telephone Encounter (Signed)
Ok to refer to cardiology ;  I will do per emr

## 2011-01-17 NOTE — Telephone Encounter (Signed)
Pt advised of cards referral and status of kidney referral via vm

## 2011-01-18 ENCOUNTER — Encounter: Payer: Self-pay | Admitting: Cardiovascular Disease

## 2011-01-19 ENCOUNTER — Encounter: Payer: Self-pay | Admitting: Cardiovascular Disease

## 2011-01-19 ENCOUNTER — Ambulatory Visit (INDEPENDENT_AMBULATORY_CARE_PROVIDER_SITE_OTHER): Payer: BC Managed Care – PPO | Admitting: Cardiovascular Disease

## 2011-01-19 VITALS — BP 125/76 | HR 63 | Resp 18 | Ht 74.0 in | Wt 194.0 lb

## 2011-01-19 DIAGNOSIS — I10 Essential (primary) hypertension: Secondary | ICD-10-CM

## 2011-01-19 DIAGNOSIS — I493 Ventricular premature depolarization: Secondary | ICD-10-CM

## 2011-01-19 DIAGNOSIS — I4949 Other premature depolarization: Secondary | ICD-10-CM

## 2011-01-19 DIAGNOSIS — E7800 Pure hypercholesterolemia, unspecified: Secondary | ICD-10-CM

## 2011-01-19 DIAGNOSIS — R002 Palpitations: Secondary | ICD-10-CM

## 2011-01-19 NOTE — Assessment & Plan Note (Signed)
Benign continue beta blocker.  Reassurance

## 2011-01-19 NOTE — Progress Notes (Signed)
55 yo referred by Dr Jonny Ruiz for palpitations.  Since January.  Some but not complete relief with beta blocker.  Had myovue, echo in January which were normal.  Holter showed benign PAC and PVC.  No SSCP, dyspnea or frank syncope.  Moderate coffee and limited ETOH.  No previous history of heart problems.  CRF;s elevated lipids Rx with fibrate.  HTN Rx with BB now  Discussed benign nature of palpitations in the absence of any other evidence of structural heart disease.  Would not use antiarrhythmic and just continue beta blocker  ROS: Denies fever, malais, weight loss, blurry vision, decreased visual acuity, cough, sputum, SOB, hemoptysis, pleuritic pain, palpitaitons, heartburn, abdominal pain, melena, lower extremity edema, claudication, or rash.   General: Affect appropriate Healthy:  appears stated age HEENT: normal Neck supple with no adenopathy JVP normal no bruits no thyromegaly Lungs clear with no wheezing and good diaphragmatic motion Heart:  S1/S2 no murmur,rub, gallop or click PMI normal Abdomen: benighn, BS positve, no tenderness, no AAA no bruit.  No HSM or HJR  S/P renal CA surgery Distal pulses intact with no bruits No edema Neuro non-focal Skin warm and dry No muscular weakness  Medications Current Outpatient Prescriptions  Medication Sig Dispense Refill  . aspirin 81 MG EC tablet Take 81 mg by mouth daily.        . fenofibrate 160 MG tablet Take 160 mg by mouth daily.        . metoprolol (TOPROL-XL) 50 MG 24 hr tablet Take one and 1/2 tabs by mouth per day  135 tablet  3  . pravastatin (PRAVACHOL) 20 MG tablet Take 20 mg by mouth daily.        . sildenafil (VIAGRA) 100 MG tablet Take 100 mg by mouth daily as needed. 1 by mouth every other day as needed         Allergies Penicillins  Family History: Family History  Problem Relation Age of Onset  . Stroke Father   . Transient ischemic attack Father   . Hypertension Father 16    CABG  . Liver cancer Maternal  Grandmother   . Heart disease Other     Social History: History   Social History  . Marital Status: Married    Spouse Name: N/A    Number of Children: 3  . Years of Education: N/A   Occupational History  . SALES    Social History Main Topics  . Smoking status: Former Games developer  . Smokeless tobacco: Not on file   Comment: quit 25 yrs ago  . Alcohol Use: No  . Drug Use: No  . Sexually Active: Not on file   Other Topics Concern  . Not on file   Social History Narrative  . No narrative on file    Electrocardiogram:  NSR Normal ECG rate 63  Assessment and Plan

## 2011-01-19 NOTE — Assessment & Plan Note (Signed)
Well controlled.  Continue current medications and low sodium Dash type diet.    

## 2011-01-19 NOTE — Patient Instructions (Signed)
Your physician wants you to follow-up in:  6 months. You will receive a reminder letter in the mail two months in advance. If you don't receive a letter, please call our office to schedule the follow-up appointment.   

## 2011-01-19 NOTE — Assessment & Plan Note (Signed)
Cholesterol is at goal.  Continue current dose of statin and diet Rx.  No myalgias or side effects.  F/U  LFT's in 6 months. Lab Results  Component Value Date   LDLCALC 84 12/12/2010

## 2011-01-23 NOTE — Op Note (Signed)
NAMEAUSENCIO, VADEN                 ACCOUNT NO.:  [id number]   MEDICAL RECORD NO.:  [id number]          PATIENT TYPE:  AMB   LOCATION:  DSC                          FACILITY:  [facility]   PHYSICIAN:  Katy Fitch. Sypher, M.D. DATE OF BIRTH:  1956/01/18   DATE OF PROCEDURE:  11/28/2007  DATE OF DISCHARGE:                               OPERATIVE REPORT   PREOPERATIVE DIAGNOSES:  1. 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  2. Right carpal tunnel syndrome with positive electrodiagnostic      studies revealing slowing of the median nerve across the right      wrist and the ulnar nerve across the right elbow.   POSTOPERATIVE DIAGNOSES:  1. Right carpal tunnel syndrome.  2. 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   OPERATION:  1. Decompression of right ulnar nerve at cubital tunnel with resection      of anconeus epitrochlearis muscle.  2. Decompression of right median nerve by release of transverse carpal      ligament.   OPERATING SURGEON:  Katy Fitch. Sypher, M.D.   ASSISTANT:  Molly Maduro Dasnoit PA-C.   ANESTHESIA:  General by LMA.   SUPERVISING ANESTHESIOLOGIST:  Zenon Mayo, MD.   INDICATIONS:  Darek Eifler is a 55 year old gentleman referred for  evaluation and management of hand numbness by Dr. Oliver Barre.   Clinical examination suggested bilateral carpal tunnel syndrome and  probable bilateral ulnar neuropathy at the elbows.   Electrodiagnostic studies were completed by Dr. Johna Roles, confirming  significant slowing of the right and left ulnar nerves across the elbow  with conduction velocities on the right of 42.9 meters per second and on  the left of approximately 40 meters per second.  The electrodiagnostic  also confirmed bilateral carpal tunnel syndrome.   Due to a failure to respond to nonoperative measures, he is brought to  the operating at this time for decompression of  his right median nerve  at the carpal canal and his right ulnar nerve at the elbow.   Preoperatively, he was interviewed by Dr. Sampson Goon.  General  anesthesia by LMA was recommended and accepted.   PROCEDURE:  Enos Fling is brought to the operating room and placed  in the supine position upon the operating table.   Following the induction of general anesthesia by LMA technique, the  right arm was prepped with Betadine soap and solution and sterilely  draped.  A pneumatic tourniquet was applied to the proximal right  brachium.   Following exsanguination of the right arm with an Esmarch bandage, the  arterial tourniquet was inflated to 220 mmHg.   Procedure commenced with a short incision in the line of the ring finger  and the palm.  Subcutaneous tissue were carefully divided revealing the  palmar fascia.  This was split longitudinally to reveal the common  sensory branch of the median nerve.  These were followed back to the  transverse carpal ligament which was gently isolated from the transverse  carpal ligament  with the use of a Penfield IV Engineer, structural.   The transverse carpal ligament was released subcutaneously extending  into the distal forearm and the volar forearm fascia was released  subcutaneously.  This widely opened the carpal canal.  No masses or  other predicaments were noted.   Bleeding points along the margin of the transverse carpal ligament were  electrocauterized with bipolar current.   The wound was then repaired with intradermal 3-0 Prolene.   Attention was directed to the right medial elbow.   A 2.5 cm incision was fashioned directly over the path of the ulnar  nerve.  Subcutaneous tissues were carefully divided taking care to  identify and spare the posterior branches of the medial antebrachial  cutaneous nerve.   The wound was then explored and a large anconeus epitrochlearis muscle  identified directly over the ulnar nerve at the cubital  tunnel.   A careful release of the anconeus epitrochlearis was performed off of  the epicondyle, followed by exposure of the ulnar nerve.  The ulnar  nerve was decompressed by release of the heads of the flexor carpi  ulnaris and the arcuate ligament as well as multiple fascial bands deep  to the heads of flexor carpi ulnaris extending 6 cm beyond the  epicondyle.  The nerve was similarly decompressed approximately 6 cm  above the epicondyle by release of the medial brachial fascia.   The wound was then checked for bleeding points which were  electrocauterized with bipolar current.  The entire anconeus  epitrochlearis muscle belly was resected without complication.   The wound was then irrigated and repaired with intradermal 3-0 Prolene.  A compressive dressing was applied with Xeroflo sterile gauze and an Ace  wrap.   For aftercare, Mr. Beer is provided a prescription for Percocet 5 mg one  p.o. q.4-6h. p.r.n. pain 20 tablets without refill.   He is advised to keep his wounds dry for one week.   We will see back for suture removal and advancement to an exercise  program in approximately 7-9 days.      Katy Fitch Sypher, M.D.  Electronically Signed     RVS/MEDQ  D:  11/28/2007  T:  11/28/2007  Job:  [id number]

## 2011-01-26 NOTE — H&P (Signed)
NAME:  Brandon Sanders, Brandon Sanders                           ACCOUNT NO.:  [id number]   MEDICAL RECORD NO.:  [id number]                   PATIENT TYPE:  INP   LOCATION:  0162                                 FACILITY:  [facility]   PHYSICIAN:  Crecencio Mc, M.D.                    DATE OF BIRTH:  1956/01/19   DATE OF ADMISSION:  11/13/2002  DATE OF DISCHARGE:                                HISTORY & PHYSICAL   CHIEF COMPLAINT:  Right renal mass.   HISTORY OF PRESENT ILLNESS:  The patient is a 55 year old white male who  recently presented to his primary care physician for a routine physical  examination.  On examination, the patient was noted to have a large right  upper quadrant mass.  He subsequently underwent evaluation with a renal  ultrasound which revealed a large renal mass.  A CT scan demonstrated an  approximately 18 cm enhancing right renal mass with areas of extensive  calcification and cystic components.  There was also some questionable  lymphadenopathy in the aorta caval area.  The patient underwent a complete  metastatic evaluation, including chest x-ray and appropriate laboratory work  which returned negative for metastatic disease.   PAST MEDICAL HISTORY:  None.   MEDICATIONS:  None.   ALLERGIES:  PENICILLIN.   SOCIAL HISTORY:  The patient denies alcohol use.  He does have a history of  tobacco use, although quit approximately 15 years ago.   FAMILY HISTORY:  Denies family history of GU malignancy.   REVIEW OF SYMPTOMS:  Review of systems is positive for recent hematuria.  Otherwise, a complete review of systems was obtained and was negative for  any other positive findings.   PHYSICAL EXAMINATION:  VITAL SIGNS:  Temperature 97.9, pulse 91,  respirations 14, blood pressure 149/88.  CONSTITUTIONAL:  The patient is alert and oriented, in no acute distress.  HEENT:  Normocephalic, atraumatic.  NECK:  No lymphadenopathy.  LUNGS:  Clear bilaterally.  HEART:  Regular rate and  rhythm without murmurs.  ABDOMEN:  A large firm right upper quadrant mass is easily palpable.  It is  nontender.  BACK:  No CVA tenderness.  GENITOURINARY:  Normal male external genitalia.  EXTREMITIES:  No cyanosis, clubbing, or edema.   IMPRESSION:  Right renal mass.    PLAN:  The patient will undergo a right radical nephrectomy with possible  retroperitoneal lymph node dissection.  The potential risks and benefits of  this procedure have been explained to the patient, and he wishes to proceed.                                               Crecencio Mc, M.D.    Arlana Hove  D:  11/13/2002  T:  11/13/2002  Job:  [id number]

## 2011-01-26 NOTE — Op Note (Signed)
NAME:  Brandon Sanders, Brandon Sanders                           ACCOUNT NO.:  [id number]   MEDICAL RECORD NO.:  [id number]                   PATIENT TYPE:  INP   LOCATION:  X003                                 FACILITY:  [facility]   PHYSICIAN:  Brandon Sanders, M.D.               DATE OF BIRTH:  05-Dec-1955   DATE OF PROCEDURE:  11/13/2002  DATE OF DISCHARGE:                                 OPERATIVE REPORT   PREOPERATIVE DIAGNOSES:  Right renal mass.   POSTOPERATIVE DIAGNOSES:  Right renal mass.   PROCEDURE:  1. Right radical nephrectomy.  2. Retroperitoneal lymph node dissection.   SURGEON:  Brandon Sanders, M.D.   ASSISTANT:  Brandon Sanders, M.D. and Brandon Sanders, M.D.   ANESTHESIA:  General.   ESTIMATED BLOOD LOSS:  300 mL   INTRAVENOUS FLUIDS:  6000 mL of lactated Ringer's and 500 mL of Hespan.   DRAINS:  Foley catheter.   SPECIMENS:  1. Right kidney.  2. Paracaval and inner aortocaval lymph nodes.   COMPLICATIONS:  None.   INDICATIONS FOR PROCEDURE:  [name] is a 55 year old white male who was  recently found to have an abdominal mass noted on routine physical  examination. The patient subsequently underwent evaluation with a CT scan  which demonstrated an 18 cm right renal mass with enhancing components  worrisome for a renal cell carcinoma. In addition, the patient was noted to  have some lymphadenopathy in the inner aortocaval region. A metastatic  evaluation was negative and there did not appear to be extensive involvement  of the renal vein or inferior vena cava. After discussing these findings  with the patient, it was decided to proceed with right radical nephrectomy  and possible retroperitoneal lymph node dissection. The potential risks and  benefits of these procedures were explained to the patient and he consented.   DESCRIPTION OF PROCEDURE:  The patient was taken to the operating room after  a thoracic epidural was placed. The patient was given general anesthesia,  administered preoperative antibiotics, then placed in the modified right  flank position. The patient was evaluated both by the urologist team as well  as the anesthesia team to ensure that all pressure points were carefully  padded. The patient was then prepped and draped in the usual sterile  fashion. A thoracoabdominal incision was then made over the 10th rib and  taken medially over the costochondral junction toward the midline of the  abdomen approximately two-thirds of the way up between the umbilicus and  xiphoid. This was carried down through the subcutaneous tissues with  electrocautery. The anterior rectus sheath was incised and the rectus  muscles were divided. The posterior rectus sheath and peritoneum were  sharply divided with Metzenbaum scissors and the peritoneal cavity was  entered. The incision was then carried laterally with electrocautery up to  the edge of the 10th rib. Rib resection was then performed.  The muscular  attachments to the periosteum were removed with electrocautery as well as an  Community education officer. A doyen was then able to be passed up  underneath the rib with the remainder of the posterior tissue swept off the  rib. It was taken back laterally and the rib cutter was then  used to resect  the rib. The sharp edge of the rib was then filed down so that no sharp  edges were present. The posterior periosteum was then taken down. An  approximately 4-5 cm incision in the pleura was made over the course of this  incision. The falciform ligament also was identified and was ligated with  silk sutures and divided. Inspection of the abdominal cavity revealed a  large retroperitoneal mass with the ascending colon displaced far medially.  The colon was carefully dissected free so it could be mobilized further  medially. Attachments to the large retroperitoneal mass were identified  inferiorly, laterally, and superiorly and were carefully taken down with   care to ligate and divide any vascular tissue or lymphatics. The duodenum  was also mobilized medially thereby exposing the inferior vena cava. Once  the mass had been completely mobilized on all sides except medially,  attention was turned to the renal hilum. A renal vein was identified and was  sharply dissected free from a large amount of surrounding lymphatic tissue.  Care was taken to clip these lymphatics prior to division. A right angle was  then passed under the renal vein and a vessiloop was passed on either side  of it. The ureter was also able to be identified inferior to the renal vein.  It was clipped and divided. The renal artery just posterior to the  identified renal vein could also be palpated. It was dissected free using  Metzenbaum scissors and two #0 silk ties were placed on the aortic side as  well as one #0 silk tie on the kidney side. This was then divided. There did  appear to be some remaining tissue posteriorly. Palpation revealed a  pulsatile vessel consistent with another renal artery. A small renal artery  that appeared to be branching from the initial identified renal artery was  isolated, ligated in a similar fashion and divided. In addition, another  large renal artery was identified posteriorly. It was able to be dissected  free and also ligated and divided in a similar fashion to the other  arteries. A second renal vein was also identified within this posterior  tissue and was carefully dissected free. Each vein was then ligated with two  #0 silk sutures placed on the vena caval side. The specimen was then able to  be removed. Gross inspection revealed a large circular mass with both soft  cystic portions as well as hard dense areas as well. Of note, the right  adrenal gland appeared to be grossly normal and was not associated with this  mass. It was therefore able to be spared. Once the kidney was removed, the renal fossa and lymphatic tissue of the  paracaval and inner aortocaval  regions were identified and were inspected due to the patient's findings on  his initial CT scan. There did reveal some abnormal lymphatic tissue  overlying the vena cava. This was carefully dissected free using blunt and  sharp dissection as necessary with care to ligate any vessels or lymphatics.  This tissue was then passed off as a separate specimen. Reinspection of the  renal fossa revealed adequate hemostasis. The wound  was copiously irrigated  and attention was turned to closure. The nasogastric tube was able to be  palpated in the stomach and inspection of the liver revealed no obvious  visceral metastases. The pleural opening was identified and a running 2-0  Vicryl suture was used to close this opening. Prior to tying the last suture  down, a 16 French red rubber catheter was placed into the pleural cavity.  This was placed on suction and the patient was given a deep breath. As the  red rubber catheter was removed, the suture was then tied down. However,  this suture did not appear to tie down well. Therefore the red rubber  catheter was reinserted into the pleural cavity and a figure-of-eight stitch  was able to be placed around the remaining opening. The patient was again  given a deep breath and the catheter was placed on suction. As it was  removed, the figure-of-eight stitch was tied down and appeared to tie down  with a good seal of the pleural cavity. The abdominal wall was then closed  in two layers. The posterior rectus fascia, fascia of the transversalis  abdominis and internal oblique, and fascia of the serratus anterior were  closed with a running #1 PDS suture. The anterior layer consisted of the  anterior rectus sheath, external oblique, and latissimus dorsi fascia. It  also was closed with a running #1 PDS suture. The wound was then again  copiously irrigated and the  skin was reapproximated with staples. There were no complications  and the  patient appeared to tolerate the procedure well. He was able to be  transferred to the recovery unit in satisfactory condition after extubation.  Please note that Dr. Gaynelle Arabian was the operating surgeon and was present  and participated in this entire procedure.     Brandon Sanders, M.D.                          Ronald L. Earlene Sanders, M.D.    Arlana Hove  D:  11/13/2002  T:  11/13/2002  Job:  [id number]   cc:   Corwin Levins, M.D. [facility]

## 2011-01-26 NOTE — Discharge Summary (Signed)
NAME:  Brandon Sanders, Brandon Sanders                           ACCOUNT NO.:  [id number]   MEDICAL RECORD NO.:  [id number]                   PATIENT TYPE:  INP   LOCATION:  0352                                 FACILITY:  [facility]   PHYSICIAN:  Lucrezia Starch. Ovidio Hanger, M.D.           DATE OF BIRTH:  1956/05/14   DATE OF ADMISSION:  11/13/2002  DATE OF DISCHARGE:  11/18/2002                                 DISCHARGE SUMMARY   DISCHARGE DIAGNOSIS:  Large right renal tumor.   OPERATIVE PROCEDURE:  Right radical nephrectomy, thoracoabdominal with  retroperitoneal lymph node dissection on 11/13/02.   HISTORY OF PRESENT ILLNESS:  The patient is a very nice 55 year old white  male who presented to his primary care physician for routine physical  examination.  On examination, he was noted to have a large right upper  quadrant mass and subsequently underwent evaluation with renal ultrasound  which revealed a large renal mass.  A CT scan demonstrated an approximately  18 cm enhancing right renal mass with areas of extension and calcifications  consistent with components also questionable of lymphadenopathy in the  aortocaval area.  Understanding the risks, benefits and alternatives, he has  elected to proceed with a right lateral nephrectomy.   For past medical history, social history, family history and review of  systems, please see typed history and physical examination for full details.   PHYSICAL EXAMINATION:  VITAL SIGNS:  He is afebrile.  Vital signs are  stable.  GENERAL:  Well-developed, well-nourished, well-groomed in no acute distress.  HEENT:  PERRLA.  NECK:  Without masses or thyromegaly.  CHEST:  Normal diaphragmatic motion.  ABDOMEN:  Large firm upper quadrant mass easily palpable and nontender.  GENITOURINARY:  Penis, testicle, anus appear normal.  EXTREMITIES:  Normal.  NEUROLOGICAL:  Intact.   HOSPITAL COURSE:  The patient was admitted.  After undergoing proper  preoperative evaluation, he  was taken to surgery on November 13, 2003, and  underwent a thoracoabdominal right radical nephrectomy and aortocaval lymph  node lymphadenectomy.  Postoperatively, initially, he was doing well with a  hemoglobin of 13.5 and creatinine of 1.3.  By postoperative day #1, he was  doing well with vital signs stable.  T-maximum was 103 degrees Farenheit.  Hematocrit was 38.5.  B-MET was stable.  Chest x-ray revealed a very small  pneumothorax which we were following, and the Foley catheter was  discontinued by November 15, 2002, postoperative day #2.  He had increased fever  the night before secondary to atelectasis.  He developed some bowel sounds,  and NG tube was clamped.  Again, the Foley catheter had been discontinued,  I&O was stable.  Creatinine was 1.7.  Electrolytes were normal.  Hemoglobin  was 13.6.  By November 16, 2002, he was feeling very well.  He had positive  flatus.  He was voiding without problems.  Vital signs were stable.  T-max  was  101.3 degrees Farenheit.  His temperature at that time was 100.7 degrees  Farenheit.  Abdomen was soft.  Wound was clean.  Chest was clear.  NG tube  was discontinued.  Diet was begun.  A chest x-ray revealed improving  pneumothorax.  On November 17, 2002, postoperative day #4. he was afebrile,  comfortable.  Chest was clearing well.  A chest x-ray had a 5% pneumothorax  which was improved.  He was tolerating clear liquids with bowel movements.  There were a few rales at the bases on auscultation of the chest.  The  abdomen was soft.  The incision was clean.  Extremities were normal.  The  pathology was discussed in great detail.  The paracaval and aortocaval  thickened tissue was fibrofatty only although a fairly extensive dissection  was performed and it was thickened with lymphatics.  The morphine, the PCA  and epidural were discontinued, and he was advanced to a diet as tolerated.  He was subsequently discharged on November 18, 2002, tolerating a diet.  Wound   was clean.   DISCHARGE INSTRUCTIONS:  The patient will follow up in the next few days for  staple removal and followup chest x-ray.  Specific instructions were given.  Discharge medications included Tylox and Colace and discharge condition was  improved.                                               Ronald L. Ovidio Hanger, M.D.    RLD/MEDQ  D:  12/21/2002  T:  12/21/2002  Job:  [id number]

## 2011-02-12 ENCOUNTER — Other Ambulatory Visit: Payer: Self-pay | Admitting: Nephrology

## 2011-02-12 DIAGNOSIS — R599 Enlarged lymph nodes, unspecified: Secondary | ICD-10-CM

## 2011-02-12 DIAGNOSIS — R1900 Intra-abdominal and pelvic swelling, mass and lump, unspecified site: Secondary | ICD-10-CM

## 2011-02-13 ENCOUNTER — Ambulatory Visit
Admission: RE | Admit: 2011-02-13 | Discharge: 2011-02-13 | Disposition: A | Discharge status: Home / Self Care | Payer: BC Managed Care – PPO | Source: Ambulatory Visit | Attending: Nephrology | Admitting: Nephrology

## 2011-02-13 DIAGNOSIS — R599 Enlarged lymph nodes, unspecified: Secondary | ICD-10-CM

## 2011-02-13 DIAGNOSIS — R1900 Intra-abdominal and pelvic swelling, mass and lump, unspecified site: Secondary | ICD-10-CM

## 2011-02-14 ENCOUNTER — Other Ambulatory Visit (HOSPITAL_COMMUNITY): Payer: Self-pay | Admitting: Nephrology

## 2011-02-14 DIAGNOSIS — R1900 Intra-abdominal and pelvic swelling, mass and lump, unspecified site: Secondary | ICD-10-CM

## 2011-02-16 ENCOUNTER — Other Ambulatory Visit: Payer: Self-pay | Admitting: Diagnostic Radiology

## 2011-02-16 ENCOUNTER — Ambulatory Visit (HOSPITAL_COMMUNITY)
Admission: RE | Admit: 2011-02-16 | Discharge: 2011-02-16 | Disposition: A | Discharge status: Home / Self Care | Payer: BC Managed Care – PPO | Source: Ambulatory Visit | Attending: Nephrology | Admitting: Nephrology

## 2011-02-16 DIAGNOSIS — Z01812 Encounter for preprocedural laboratory examination: Secondary | ICD-10-CM | POA: Insufficient documentation

## 2011-02-16 DIAGNOSIS — C772 Secondary and unspecified malignant neoplasm of intra-abdominal lymph nodes: Secondary | ICD-10-CM | POA: Insufficient documentation

## 2011-02-16 DIAGNOSIS — C649 Malignant neoplasm of unspecified kidney, except renal pelvis: Secondary | ICD-10-CM | POA: Insufficient documentation

## 2011-02-16 DIAGNOSIS — R1900 Intra-abdominal and pelvic swelling, mass and lump, unspecified site: Secondary | ICD-10-CM

## 2011-02-16 LAB — PROTIME-INR: INR: 0.92 (ref 0.00–1.49)

## 2011-02-16 LAB — CBC
HCT: 45.9 % (ref 39.0–52.0)
Hemoglobin: 16 g/dL (ref 13.0–17.0)
WBC: 6.3 10*3/uL (ref 4.0–10.5)

## 2011-02-16 LAB — APTT: aPTT: 31 seconds (ref 24–37)

## 2011-02-20 ENCOUNTER — Other Ambulatory Visit: Payer: Self-pay | Admitting: Internal Medicine

## 2011-02-20 ENCOUNTER — Telehealth: Payer: Self-pay | Admitting: Internal Medicine

## 2011-02-20 DIAGNOSIS — C649 Malignant neoplasm of unspecified kidney, except renal pelvis: Secondary | ICD-10-CM

## 2011-02-20 NOTE — Telephone Encounter (Signed)
Faxed information to Dr. Earlene Plater at [phone]

## 2011-02-20 NOTE — Telephone Encounter (Signed)
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

## 2011-02-22 ENCOUNTER — Telehealth: Payer: Self-pay | Admitting: *Deleted

## 2011-02-22 DIAGNOSIS — C649 Malignant neoplasm of unspecified kidney, except renal pelvis: Secondary | ICD-10-CM

## 2011-02-22 NOTE — Telephone Encounter (Signed)
Pt is requesting a referral to an oncologist in [place] that Dr. Jonny Ruiz would recommend-pt also wants to inform MD that he has appt with Dr. Earlene Plater at [facility] next week

## 2011-02-22 NOTE — Telephone Encounter (Signed)
Referral done

## 2011-02-22 NOTE — Telephone Encounter (Signed)
Pt informed

## 2011-02-27 ENCOUNTER — Other Ambulatory Visit: Payer: Self-pay | Admitting: Oncology

## 2011-02-27 ENCOUNTER — Encounter (HOSPITAL_BASED_OUTPATIENT_CLINIC_OR_DEPARTMENT_OTHER): Payer: BC Managed Care – PPO | Admitting: Oncology

## 2011-02-27 DIAGNOSIS — C649 Malignant neoplasm of unspecified kidney, except renal pelvis: Secondary | ICD-10-CM

## 2011-02-27 LAB — COMPREHENSIVE METABOLIC PANEL
ALT: 13 U/L (ref 0–53)
AST: 23 U/L (ref 0–37)
Albumin: 4.7 g/dL (ref 3.5–5.2)
Calcium: 9.8 mg/dL (ref 8.4–10.5)
Chloride: 104 mEq/L (ref 96–112)
Creatinine, Ser: 1.79 mg/dL — ABNORMAL HIGH (ref 0.50–1.35)
Potassium: 4.8 mEq/L (ref 3.5–5.3)
Sodium: 140 mEq/L (ref 135–145)
Total Protein: 7.5 g/dL (ref 6.0–8.3)

## 2011-02-27 LAB — CBC WITH DIFFERENTIAL/PLATELET
Basophils Absolute: 0 10*3/uL (ref 0.0–0.1)
EOS%: 1.6 % (ref 0.0–7.0)
HGB: 14.9 g/dL (ref 13.0–17.1)
MCH: 29.5 pg (ref 27.2–33.4)
MCV: 86.8 fL (ref 79.3–98.0)
MONO%: 7.5 % (ref 0.0–14.0)
RDW: 13.3 % (ref 11.0–14.6)

## 2011-04-11 ENCOUNTER — Encounter (HOSPITAL_BASED_OUTPATIENT_CLINIC_OR_DEPARTMENT_OTHER): Payer: BC Managed Care – PPO | Admitting: Oncology

## 2011-04-11 ENCOUNTER — Other Ambulatory Visit: Payer: Self-pay | Admitting: Oncology

## 2011-04-11 DIAGNOSIS — C649 Malignant neoplasm of unspecified kidney, except renal pelvis: Secondary | ICD-10-CM

## 2011-04-11 DIAGNOSIS — G893 Neoplasm related pain (acute) (chronic): Secondary | ICD-10-CM

## 2011-04-11 LAB — COMPREHENSIVE METABOLIC PANEL
ALT: 17 U/L (ref 0–53)
AST: 25 U/L (ref 0–37)
Albumin: 4.5 g/dL (ref 3.5–5.2)
Alkaline Phosphatase: 50 U/L (ref 39–117)
Potassium: 4.5 mEq/L (ref 3.5–5.3)
Sodium: 140 mEq/L (ref 135–145)
Total Protein: 7.4 g/dL (ref 6.0–8.3)

## 2011-04-11 LAB — CBC WITH DIFFERENTIAL/PLATELET
EOS%: 1.7 % (ref 0.0–7.0)
Eosinophils Absolute: 0.1 10*3/uL (ref 0.0–0.5)
MCH: 29.5 pg (ref 27.2–33.4)
MCV: 87.1 fL (ref 79.3–98.0)
MONO%: 5.9 % (ref 0.0–14.0)
NEUT#: 4.1 10*3/uL (ref 1.5–6.5)
RBC: 4.98 10*6/uL (ref 4.20–5.82)
RDW: 13.3 % (ref 11.0–14.6)

## 2011-04-24 ENCOUNTER — Encounter: Payer: Self-pay | Admitting: Internal Medicine

## 2011-04-24 ENCOUNTER — Ambulatory Visit (INDEPENDENT_AMBULATORY_CARE_PROVIDER_SITE_OTHER): Payer: BC Managed Care – PPO | Admitting: Internal Medicine

## 2011-04-24 DIAGNOSIS — F528 Other sexual dysfunction not due to a substance or known physiological condition: Secondary | ICD-10-CM

## 2011-04-24 DIAGNOSIS — E7800 Pure hypercholesterolemia, unspecified: Secondary | ICD-10-CM

## 2011-04-24 DIAGNOSIS — I10 Essential (primary) hypertension: Secondary | ICD-10-CM

## 2011-04-24 MED ORDER — METOPROLOL SUCCINATE ER 50 MG PO TB24: ORAL_TABLET | ORAL | Status: DC

## 2011-04-24 MED ORDER — VARDENAFIL HCL 20 MG PO TABS: 20.0000 mg | ORAL_TABLET | ORAL | Status: DC | PRN

## 2011-04-24 MED ORDER — PRAVASTATIN SODIUM 20 MG PO TABS: 20.0000 mg | ORAL_TABLET | Freq: Every day | ORAL | Status: DC

## 2011-04-24 MED ORDER — FENOFIBRATE 160 MG PO TABS: 160.0000 mg | ORAL_TABLET | Freq: Every day | ORAL | Status: DC

## 2011-04-24 NOTE — Progress Notes (Signed)
Subjective:    Patient ID: Brandon Sanders, male    DOB: August 30, 1956, 55 y.o.   MRN: [id number]  HPI Here to f/u;  Is considering becoming enrolled into clinical trial at [facility] for recurrent renal cell ca;  Did see Dr Juliette Alcide local oncology, and Dr Davis/urology at [facility];  Wanted to discuss that viagra just not working as well as before, used to take half pills,now taking whole pills; ED symptoms may have become worse with recent increased stress as well;  Pt denies chest pain, increased sob or doe, wheezing, orthopnea, PND, increased LE swelling, palpitations, dizziness or syncope.  Pt denies new neurological symptoms such as new headache, or facial or extremity weakness or numbness  Pt denies polydipsia, polyuria.   Pt denies fever, wt loss, night sweats, loss of appetite, or other constitutional symptoms Past Medical History  Diagnosis Date  . Hyperlipidemia   . HTN (hypertension)   . Erectile dysfunction   . Carpal tunnel syndrome, bilateral   . Chronic renal insufficiency   . Solitary kidney, acquired   . Renal cell cancer   . DJD of shoulder   . RENAL CELL CANCER 10/28/2008  . HYPERCHOLESTEROLEMIA 06/24/2007  . HYPERTENSION 10/27/2007  . SYNCOPE 09/05/2010  . COLITIS 12/17/2007  . ERECTILE DYSFUNCTION 06/24/2007  . Palpitations 09/05/2010  . PLANTAR FASCIITIS 09/05/2010   Past Surgical History  Procedure Date  . Nephrectomy     Right  . S.p right cts release   . S/p right ulnar nerve release     reports that he has quit smoking. He does not have any smokeless tobacco history on file. He reports that he does not drink alcohol or use illicit drugs. family history includes Heart disease in his other; Hypertension (age of onset:40) in his father; Liver cancer in his maternal grandmother; Stroke in his father; and Transient ischemic attack in his father. Allergies  Allergen Reactions  . Penicillins    Current Outpatient Prescriptions on File Prior to Visit  Medication Sig  Dispense Refill  . aspirin 81 MG EC tablet Take 81 mg by mouth daily.        . sildenafil (VIAGRA) 100 MG tablet Take 100 mg by mouth daily as needed. 1 by mouth every other day as needed        Review of Systems Review of Systems  Constitutional: Negative for diaphoresis and unexpected weight change.  HENT: Negative for drooling and tinnitus.   Eyes: Negative for photophobia and visual disturbance.  Respiratory: Negative for choking and stridor.   Gastrointestinal: Negative for vomiting and blood in stool.  Genitourinary: Negative for hematuria and decreased urine volume.    Objective:   Physical Exam BP 132/78  Pulse 69  Temp(Src) 97.6 F (36.4 C) (Oral)  Ht 6\' 2"  (1.88 m)  Wt 190 lb 6.4 oz (86.365 kg)  BMI 24.45 kg/m2  SpO2 97% Physical Exam  VS noted Constitutional: Pt appears well-developed and well-nourished.  HENT: Head: Normocephalic.  Right Ear: External ear normal.  Left Ear: External ear normal.  Eyes: Conjunctivae and EOM are normal. Pupils are equal, round, and reactive to light.  Neck: Normal range of motion. Neck supple.  Cardiovascular: Normal rate and regular rhythm.   Pulmonary/Chest: Effort normal and breath sounds normal.  Abd:  Soft, NT, non-distended, + BS Neurological: Pt is alert. No cranial nerve deficit.  Skin: Skin is warm. No erythema.  Psychiatric: Pt behavior is normal. Thought content normal.  Assessment & Plan:

## 2011-04-24 NOTE — Assessment & Plan Note (Signed)
stable overall by hx and exam, most recent data reviewed with pt, and pt to continue medical treatment as before  BP Readings from Last 3 Encounters:  04/24/11 132/78  01/19/11 125/76  12/26/10 122/70

## 2011-04-24 NOTE — Patient Instructions (Signed)
Take all new medications as prescribed Continue all other medications as before  

## 2011-04-24 NOTE — Assessment & Plan Note (Signed)
For levitra trial, consider further eval such as testosterone if not improved, but not clear if prudent to tx given his upcoming tx for recurrent renal cell ca

## 2011-04-24 NOTE — Assessment & Plan Note (Signed)
stable overall by hx and exam, most recent data reviewed with pt, and pt to continue medical treatment as before  Lab Results  Component Value Date   LDLCALC 84 12/12/2010

## 2011-04-25 ENCOUNTER — Other Ambulatory Visit: Payer: Self-pay

## 2011-04-25 MED ORDER — FENOFIBRATE 160 MG PO TABS: 160.0000 mg | ORAL_TABLET | Freq: Every day | ORAL | Status: DC

## 2011-06-04 LAB — BASIC METABOLIC PANEL
CO2: 30
Calcium: 9.9
Creatinine, Ser: 1.18
Glucose, Bld: 111 — ABNORMAL HIGH

## 2011-09-24 ENCOUNTER — Telehealth: Payer: Self-pay

## 2011-09-24 DIAGNOSIS — Z0000 Encounter for general adult medical examination without abnormal findings: Secondary | ICD-10-CM

## 2011-09-24 DIAGNOSIS — Z1289 Encounter for screening for malignant neoplasm of other sites: Secondary | ICD-10-CM

## 2011-09-24 NOTE — Telephone Encounter (Signed)
Put order in for physical labs. 

## 2011-10-17 ENCOUNTER — Other Ambulatory Visit: Payer: Self-pay

## 2011-10-17 MED ORDER — PRAVASTATIN SODIUM 20 MG PO TABS: 20.0000 mg | ORAL_TABLET | Freq: Every day | ORAL | Status: DC

## 2011-10-29 ENCOUNTER — Other Ambulatory Visit (INDEPENDENT_AMBULATORY_CARE_PROVIDER_SITE_OTHER): Payer: 59

## 2011-10-29 ENCOUNTER — Ambulatory Visit (INDEPENDENT_AMBULATORY_CARE_PROVIDER_SITE_OTHER): Payer: 59 | Admitting: Internal Medicine

## 2011-10-29 ENCOUNTER — Encounter: Payer: Self-pay | Admitting: Internal Medicine

## 2011-10-29 VITALS — BP 148/88 | HR 75 | Temp 98.3°F | Ht 74.0 in | Wt 193.4 lb

## 2011-10-29 DIAGNOSIS — F528 Other sexual dysfunction not due to a substance or known physiological condition: Secondary | ICD-10-CM

## 2011-10-29 DIAGNOSIS — Z0000 Encounter for general adult medical examination without abnormal findings: Secondary | ICD-10-CM

## 2011-10-29 DIAGNOSIS — I10 Essential (primary) hypertension: Secondary | ICD-10-CM

## 2011-10-29 DIAGNOSIS — E7800 Pure hypercholesterolemia, unspecified: Secondary | ICD-10-CM

## 2011-10-29 LAB — CBC WITH DIFFERENTIAL/PLATELET
Eosinophils Relative: 3.8 % (ref 0.0–5.0)
HCT: 39.8 % (ref 39.0–52.0)
Hemoglobin: 13.3 g/dL (ref 13.0–17.0)
Lymphs Abs: 1.1 10*3/uL (ref 0.7–4.0)
MCV: 78.6 fl (ref 78.0–100.0)
Monocytes Absolute: 0.7 10*3/uL (ref 0.1–1.0)
Neutro Abs: 4 10*3/uL (ref 1.4–7.7)
Platelets: 282 10*3/uL (ref 150.0–400.0)
WBC: 6.1 10*3/uL (ref 4.5–10.5)

## 2011-10-29 LAB — PSA: PSA: 1.99 ng/mL (ref 0.10–4.00)

## 2011-10-29 MED ORDER — VARDENAFIL HCL 20 MG PO TABS: 20.0000 mg | ORAL_TABLET | ORAL | Status: DC | PRN

## 2011-10-29 MED ORDER — AMLODIPINE BESYLATE 5 MG PO TABS: 5.0000 mg | ORAL_TABLET | Freq: Every day | ORAL | Status: DC

## 2011-10-29 MED ORDER — FENOFIBRATE 160 MG PO TABS: 160.0000 mg | ORAL_TABLET | Freq: Every day | ORAL | Status: DC

## 2011-10-29 MED ORDER — TADALAFIL 20 MG PO TABS: 20.0000 mg | ORAL_TABLET | Freq: Every day | ORAL | Status: DC | PRN

## 2011-10-29 MED ORDER — METOPROLOL SUCCINATE ER 25 MG PO TB24: 25.0000 mg | ORAL_TABLET | Freq: Every day | ORAL | Status: DC

## 2011-10-29 MED ORDER — PRAVASTATIN SODIUM 20 MG PO TABS: 20.0000 mg | ORAL_TABLET | Freq: Every day | ORAL | Status: DC

## 2011-10-29 NOTE — Progress Notes (Signed)
Subjective:    Patient ID: Brandon Sanders, male    DOB: June 09, 1956, 56 y.o.   MRN: [id number]  HPI  Here for wellness and f/u;  Overall doing ok;  Pt denies CP, worsening SOB, DOE, wheezing, orthopnea, PND, worsening LE edema, palpitations, dizziness or syncope.  Pt denies neurological change such as new Headache, facial or extremity weakness.  Pt denies polydipsia, polyuria, or low sugar symptoms. Pt states overall good compliance with treatment and medications, good tolerability, and trying to follow lower cholesterol diet.  Pt denies worsening depressive symptoms, suicidal ideation or panic. No fever, wt loss, night sweats, loss of appetite, or other constitutional symptoms.  Pt states good ability with ADL's, low fall risk, home safety reviewed and adequate, no significant changes in hearing or vision, and occasionally active with exercise. Having invest drug tx for met renal cell ca, with mult blood testing every 2 wks.  Did incidentally miss cbc done at [facility] last wk, needs today, as well as PSA but all other routine labs just done,  Has CKD with cr 1.5-1.7.  No significnat wt loss recent.  Recent bone scan neg for met osseus dz;  And CT with stable retroperitoneal mass.   Does have signficant ED , ongoing fatigue in the setting of start of tx for palp's with metoprolol, for which he is only taking 50 mg  Past Medical History  Diagnosis Date  . Hyperlipidemia   . HTN (hypertension)   . Erectile dysfunction   . Carpal tunnel syndrome, bilateral   . Chronic renal insufficiency   . Solitary kidney, acquired   . Renal cell cancer   . DJD of shoulder   . RENAL CELL CANCER 10/28/2008  . HYPERCHOLESTEROLEMIA 06/24/2007  . HYPERTENSION 10/27/2007  . SYNCOPE 09/05/2010  . COLITIS 12/17/2007  . ERECTILE DYSFUNCTION 06/24/2007  . Palpitations 09/05/2010  . PLANTAR FASCIITIS 09/05/2010   Past Surgical History  Procedure Date  . Nephrectomy     Right  . S.p right cts release   . S/p right ulnar  nerve release     reports that he has quit smoking. He does not have any smokeless tobacco history on file. He reports that he does not drink alcohol or use illicit drugs. family history includes Heart disease in his other; Hypertension (age of onset:40) in his father; Liver cancer in his maternal grandmother; Stroke in his father; and Transient ischemic attack in his father. Allergies  Allergen Reactions  . Penicillins    Current Outpatient Prescriptions on File Prior to Visit  Medication Sig Dispense Refill  . aspirin 81 MG EC tablet Take 81 mg by mouth daily.         Review of Systems Review of Systems  Constitutional: Negative for diaphoresis, activity change, appetite change and unexpected weight change.  HENT: Negative for hearing loss, ear pain, facial swelling, mouth sores and neck stiffness.   Eyes: Negative for pain, redness and visual disturbance.  Respiratory: Negative for shortness of breath and wheezing.   Cardiovascular: Negative for chest pain and palpitations.  Gastrointestinal: Negative for diarrhea, blood in stool, abdominal distention and rectal pain.  Genitourinary: Negative for hematuria, flank pain and decreased urine volume.  Musculoskeletal: Negative for myalgias and joint swelling.  Skin: Negative for color change and wound.  Neurological: Negative for syncope and numbness.  Hematological: Negative for adenopathy.  Psychiatric/Behavioral: Negative for hallucinations, self-injury, decreased concentration and agitation.      Objective:   Physical Exam BP 148/88  Pulse  75  Temp(Src) 98.3 F (36.8 C) (Oral)  Ht 6\' 2"  (1.88 m)  Wt 193 lb 6.4 oz (87.726 kg)  BMI 24.83 kg/m2  SpO2 98% Physical Exam  VS noted Constitutional: Pt is oriented to person, place, and time. Appears well-developed and well-nourished.  HENT:  Head: Normocephalic and atraumatic.  Right Ear: External ear normal.  Left Ear: External ear normal.  Nose: Nose normal.  Mouth/Throat:  Oropharynx is clear and moist.  Eyes: Conjunctivae and EOM are normal. Pupils are equal, round, and reactive to light.  Neck: Normal range of motion. Neck supple. No JVD present. No tracheal deviation present.  Cardiovascular: Normal rate, regular rhythm, normal heart sounds and intact distal pulses.   Pulmonary/Chest: Effort normal and breath sounds normal.  Abdominal: Soft. Bowel sounds are normal. There is no tenderness.  Musculoskeletal: Normal range of motion. Exhibits no edema.  Lymphadenopathy:  Has no cervical adenopathy.  Neurological: Pt is alert and oriented to person, place, and time. Pt has normal reflexes. No cranial nerve deficit.  Skin: Skin is warm and dry. No rash noted.  Psychiatric:  Has  normal mood and affect. Behavior is normal.     Assessment & Plan:

## 2011-10-29 NOTE — Patient Instructions (Addendum)
Your EKG was Good today Please go to LAB in the Basement for the blood and/or urine tests to be done today - the CBC, and the PSA Please call the phone number [phone] (the PhoneTree System) for results of testing in 2-3 days;  When calling, simply dial the number, and when prompted enter the MRN number above (the Medical Record Number) and the # key, then the message should start. We will also fax the results to [facility] when available later today Take all new medications as prescribed - the cialis 20 mg as needed, the amlodipine 5 mg per day, and metoprolol ER 25 mg per day Please go to [email or web address] for the free trial coupon for the pharmacy, and call if you would like prescription for ongoing cialis after OK to stop the metoprolol ER 50 mg Continue all other medications as before - your refills were done today Please follow lower cholesterol diet Please keep your appointments with your specialists as you have planned - [facility] Please continue to monitor your Blood Pressure on a regular basis on the affinitor; your goal is to be less than 140/90 Please return kin 6 months, or sooner if needed

## 2011-10-29 NOTE — Assessment & Plan Note (Signed)
To cont levitra, but also sample rx for cialis trial given as well to compare,  to f/u any worsening symptoms or concerns

## 2011-10-29 NOTE — Assessment & Plan Note (Signed)
Mild, for lower chol diet, avoid statin while on afinitor chemo

## 2011-10-29 NOTE — Assessment & Plan Note (Signed)
With fatigue and ED possibly related to metoprolol, to decr the metoprolol to 25 ER qd, add amlod 5 qd (avoid ace/arb due to CRI), monito BP at home and next visit,  to f/u any worsening symptoms or concerns

## 2011-10-29 NOTE — Assessment & Plan Note (Signed)

## 2011-11-22 ENCOUNTER — Other Ambulatory Visit: Payer: Self-pay

## 2011-11-22 MED ORDER — FENOFIBRATE 160 MG PO TABS: 160.0000 mg | ORAL_TABLET | Freq: Every day | ORAL | Status: DC

## 2011-12-21 IMAGING — CT CT ABD-PELV W/O CM
2 of 4 series · 10 of 36 positions shown, 17 images · IV contrast (READICAT/WATER)
Comparison: CT abdomen pelvis of 09/25/2007

CLINICAL DATA: History of right renal cell carcinoma previously,
now with prominent nodes in the pelvis on physical exam

CT ABDOMEN AND PELVIS WITHOUT CONTRAST
TECHNIQUE: Multidetector CT imaging of the abdomen and pelvis was
performed following the standard protocol without intravenous
contrast.

[Series 3: unenhanced · axial · 0.75mm/px · z∈[-403,-23]mm · 9 of 96 slices shown, 15 images]
[im 10/96  soft-tissue]
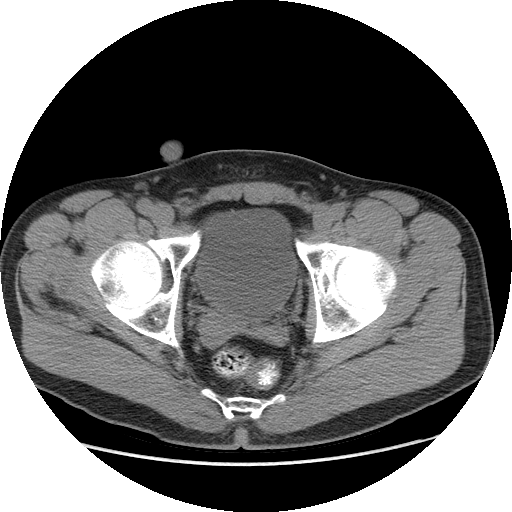
[im 10/96  bone]
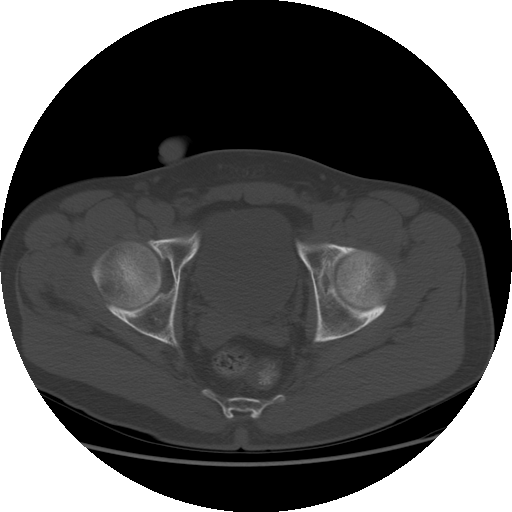
[im 20/96  soft-tissue]
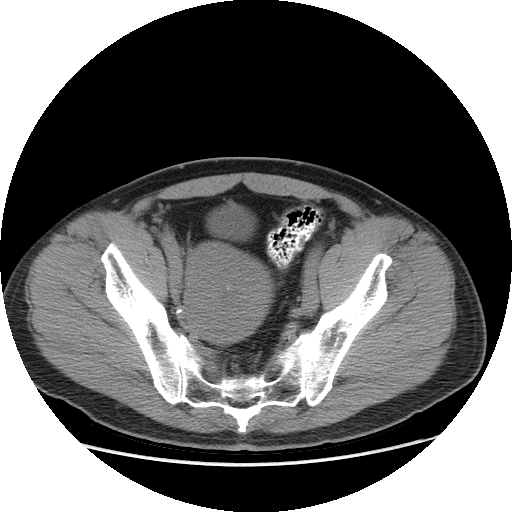
[im 29/96  soft-tissue]
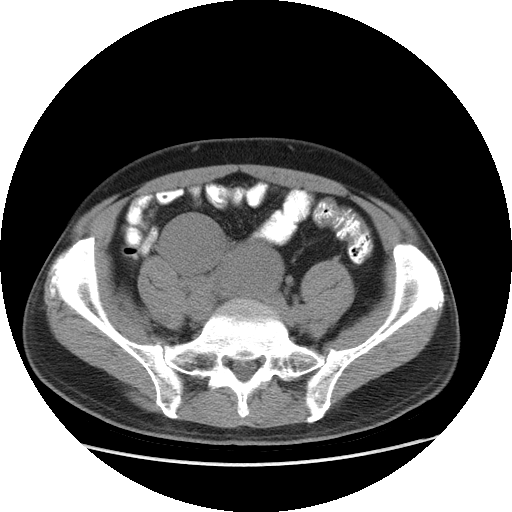
[im 39/96  soft-tissue]
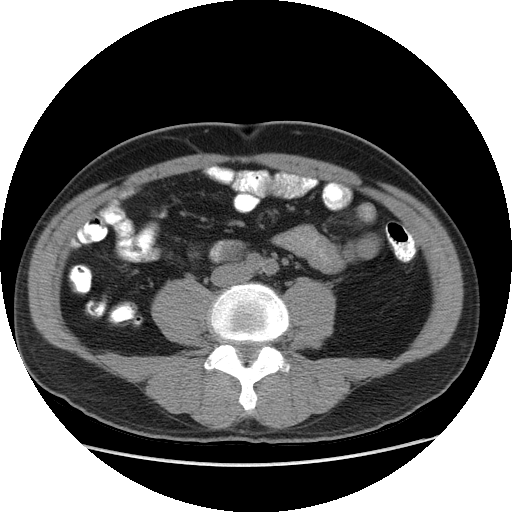
[im 48/96  soft-tissue]
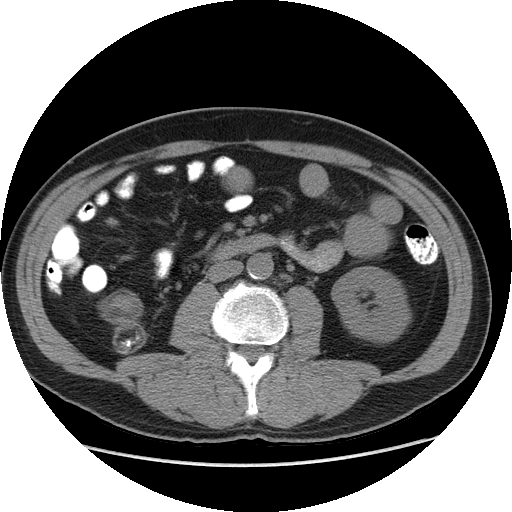
[im 58/96  soft-tissue]
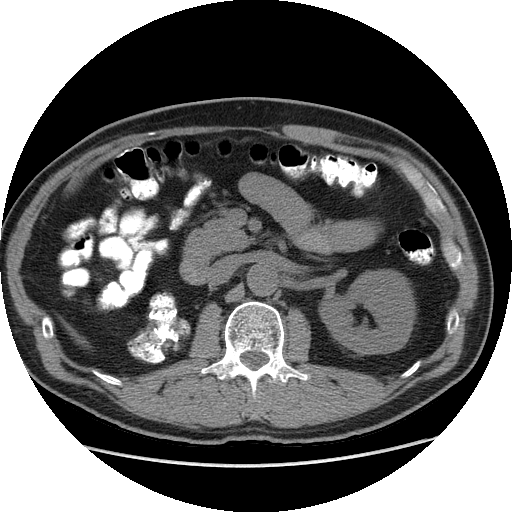
[im 58/96  lung]
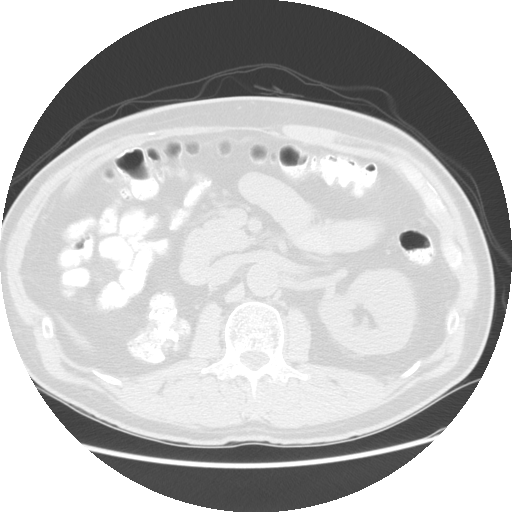
[im 67/96  soft-tissue]
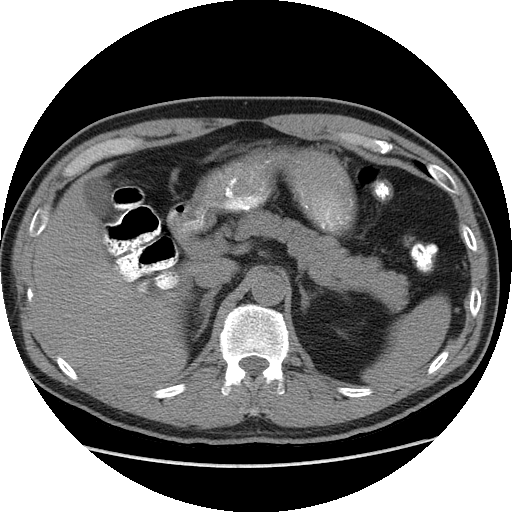
[im 67/96  lung]
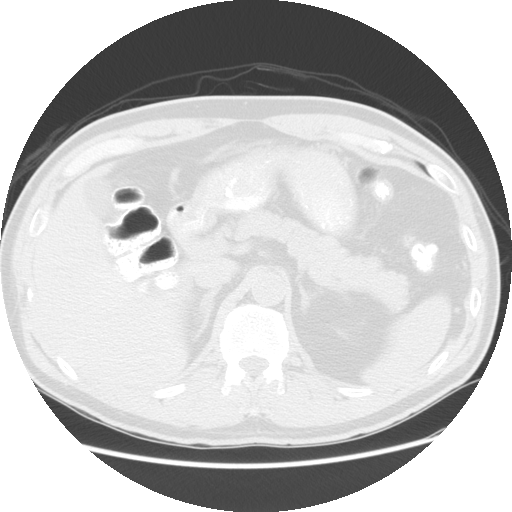
[im 77/96  soft-tissue]
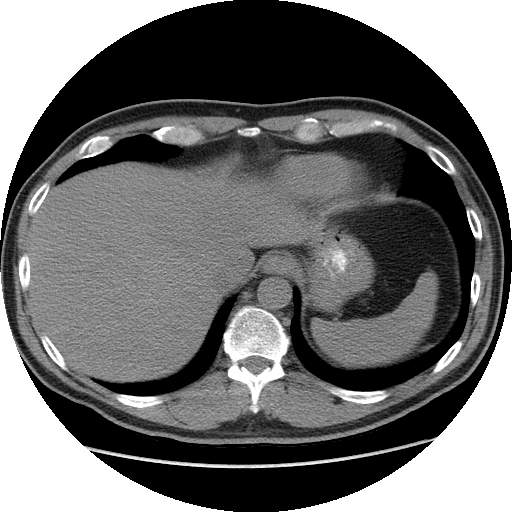
[im 77/96  lung]
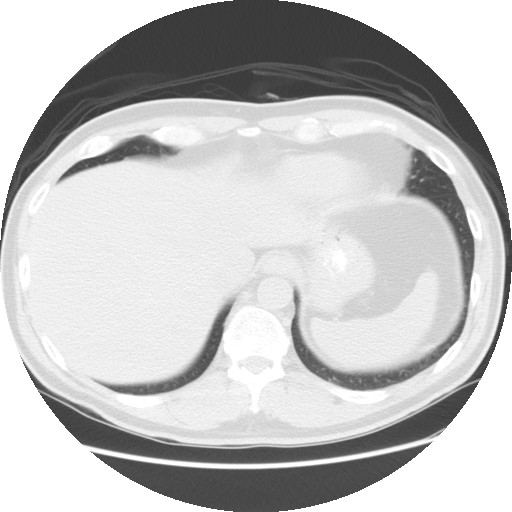
[im 86/96  soft-tissue]
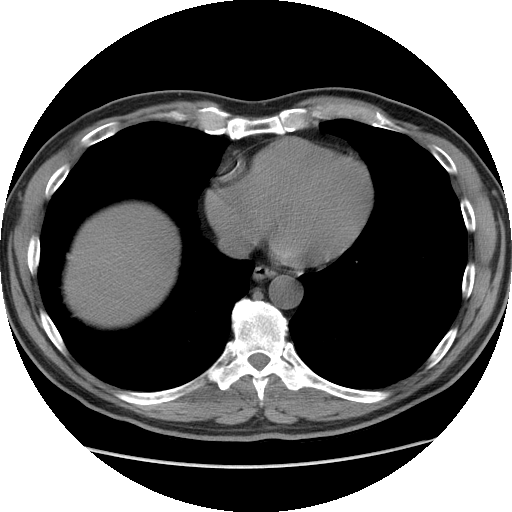
[im 86/96  lung]
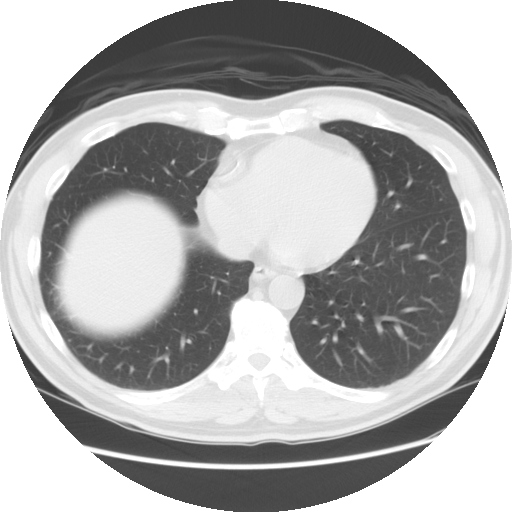
[im 86/96  bone]
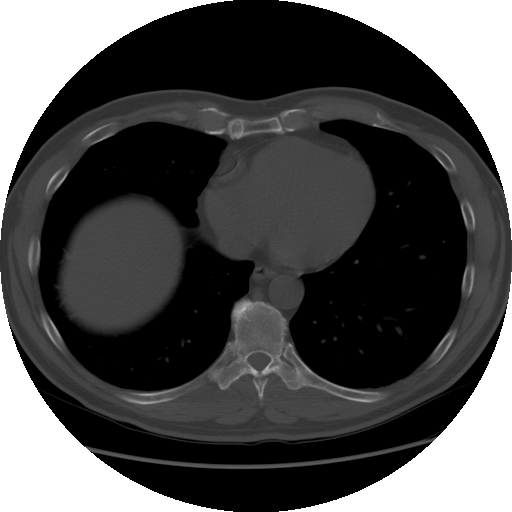

[Series 601: coronal body · coronal · 0.93mm/px · 1 of 115 slices shown, 2 images]
[im 39/115  soft-tissue]
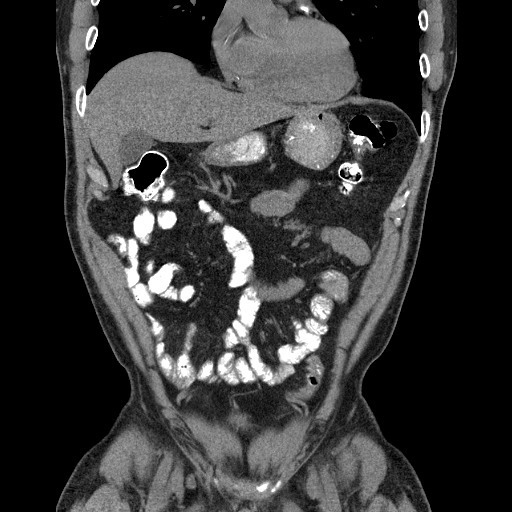
[im 39/115  bone]
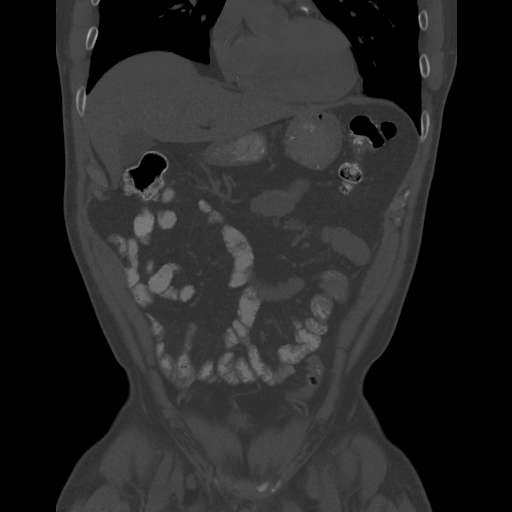

[10 of 36 positions shown; findings below may reference images not displayed]

FINDINGS: The lung bases are clear.  No effusion is seen.  Coronary
artery calcifications are present.  The liver is unremarkable in
the unenhanced state.  No calcified gallstones are seen.  The
pancreas is normal in size and the pancreatic duct is not dilated.
The adrenal glands are unremarkable with a tiny incidental right
adrenal adenoma present.  The spleen is normal in size.  The
stomach is not well distended.  The left kidney is unremarkable
with no calculus or mass and no left hydronephrosis is seen.
Changes of prior right nephrectomy are noted.  There is no evidence
of local recurrence of disease.

Within the right lower quadrant as previously noted there is
prominent mucosa of the distal and terminal ileum as it enters the
ileocecal valve.  No evidence of an active inflammatory process is
seen and this probably represents chronic change of inflammatory
bowel disease such as Crohn's disease.  Clinical correlation is
recommended.

However, there is evidence of adenopathy within the upper and right
mid pelvis.  Just anterior to the IVC there is a node of 27 x 23
mm.  Within the right pelvis there is a rounded soft tissue mass of
46 x 49 mm with an additional solid soft tissue mass of 74 x 81 mm
with some central calcification.  These soft tissue masses most
likely represent adenopathy, probably due to recurrent renal cell
carcinoma.  Biopsy may be helpful.  The larger of these masses
impinges upon the urinary bladder along the right superior aspect.
The prostate is normal in size.  No free fluid is seen within the
pelvis.  No groin adenopathy is seen.  In view of the homogeneous
soft tissue nature of these masses within the pelvis, lymphoma
would also be a consideration. Testicular carcinoma also should be
considered.  No bony abnormality is seen.
IMPRESSION: 1.  Rounded soft tissue masses in the upper and right pelvis most
consistent with adenopathy.  Consider recurrence of renal cell
carcinoma, possibly lymphoma or metastases from testicular
carcinoma.
2.  Faint coronary artery calcifications.
3.  Probable chronic changes of inflammatory bowel disease in the
right lower quadrant.  Correlate clinically.

## 2011-12-24 IMAGING — CT CT BIOPSY
1 of 2 series · 14 of 32 positions shown, 19 images · non-contrast
Comparison: none

CLINICAL HISTORY: 54-year-old with history renal cell carcinoma and
post right nephrectomy.  New enlarged lymph nodes in the right
lower abdomen.

[Series 3: — · axial · 0.70mm/px · z∈[-1262,-52]mm · 14 of 45 slices shown, 19 images]
[im 3/45  soft-tissue]
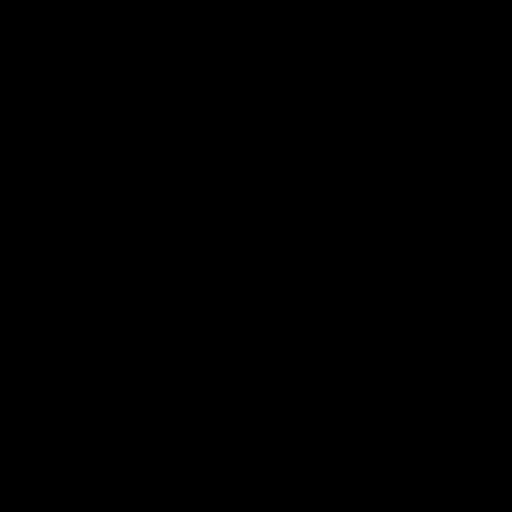
[im 3/45  bone]
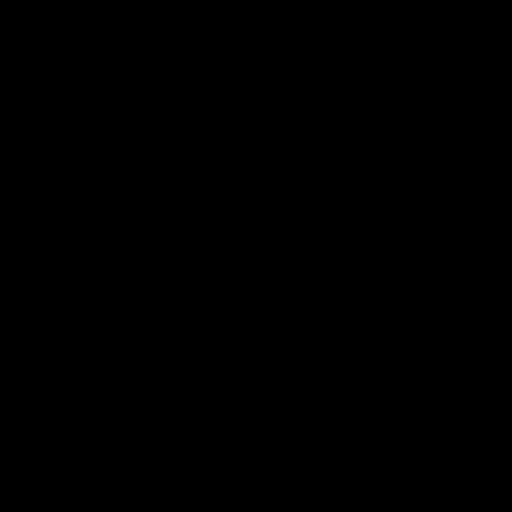
[im 7/45  soft-tissue]
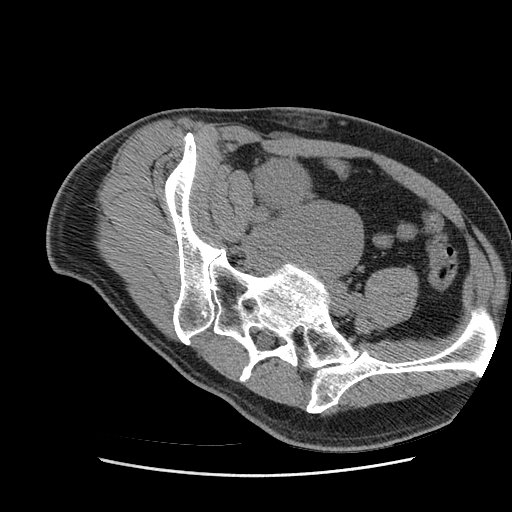
[im 9/45  soft-tissue]
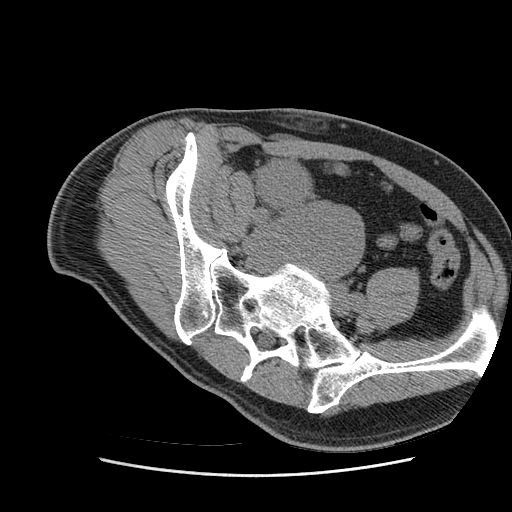
[im 13/45  soft-tissue]
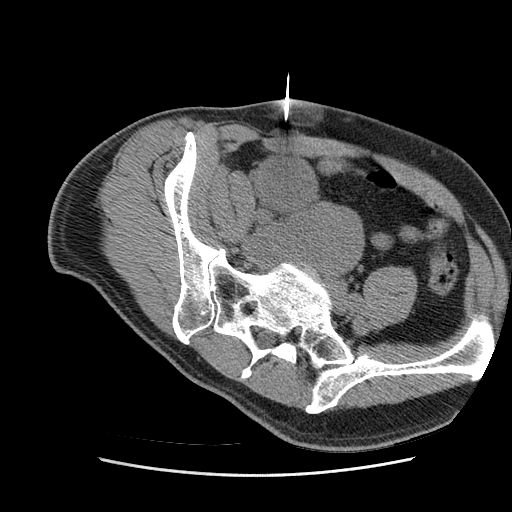
[im 15/45  soft-tissue]
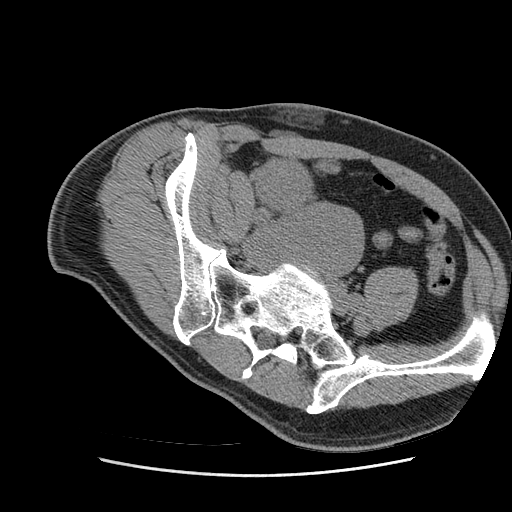
[im 19/45  soft-tissue]
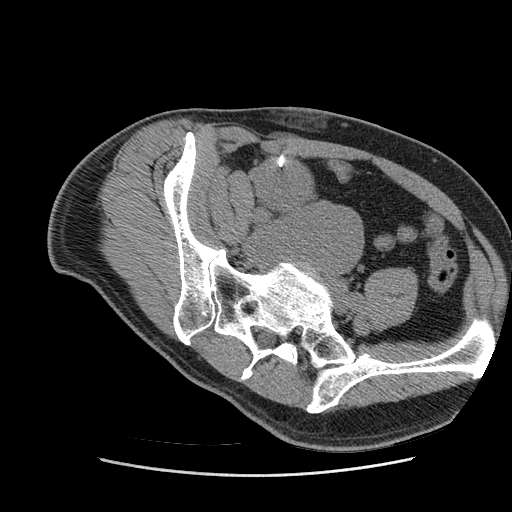
[im 24/45  soft-tissue]
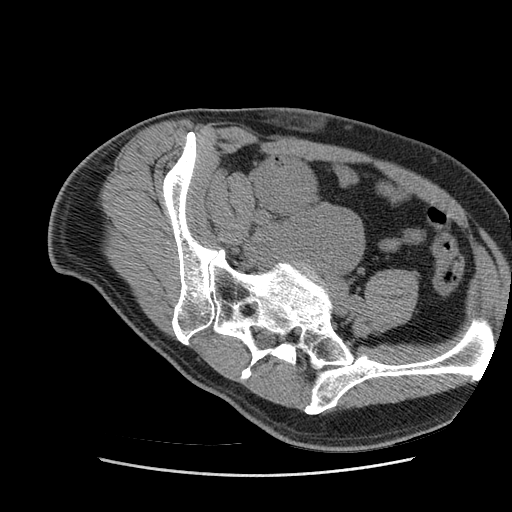
[im 26/45  soft-tissue]
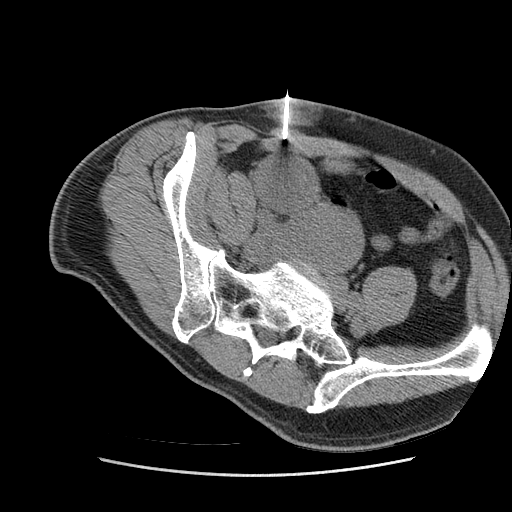
[im 26/45  lung]
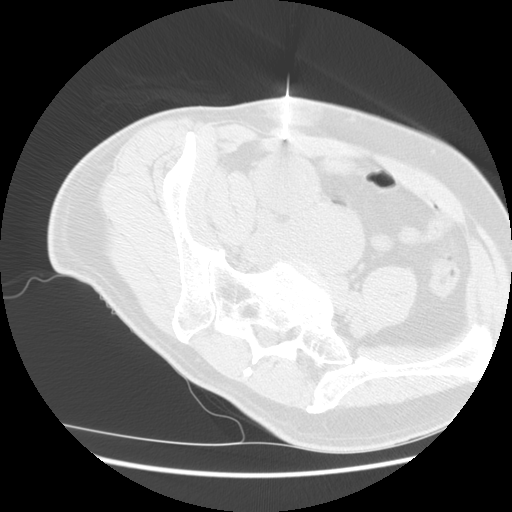
[im 30/45  soft-tissue]
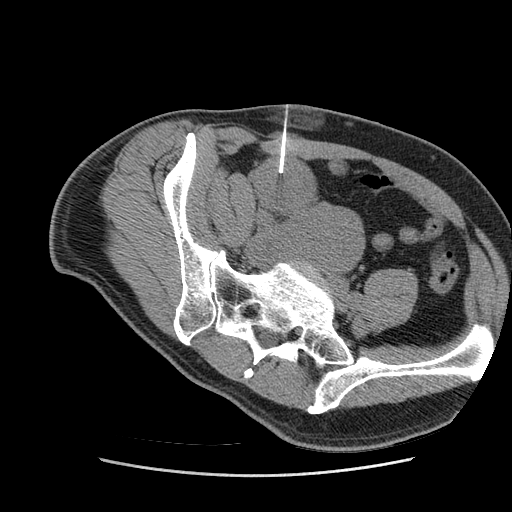
[im 30/45  bone]
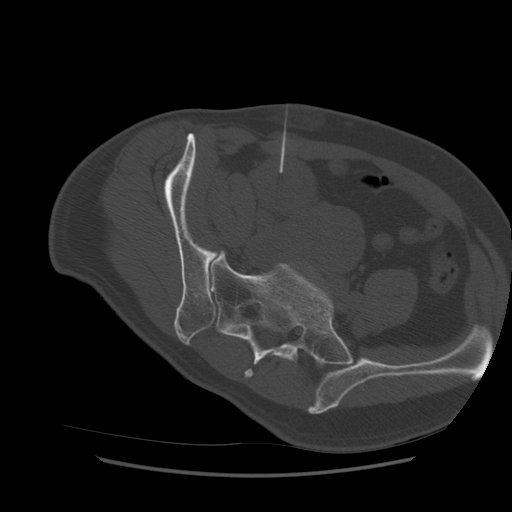
[im 32/45  soft-tissue]
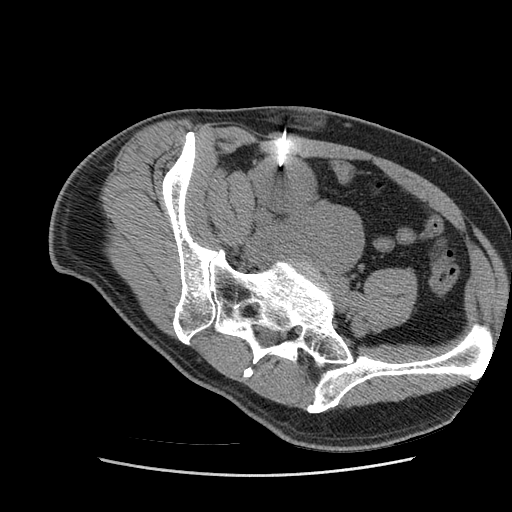
[im 36/45  soft-tissue]
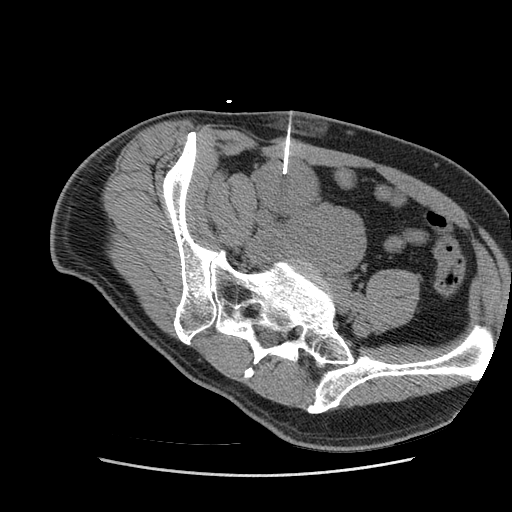
[im 38/45  soft-tissue]
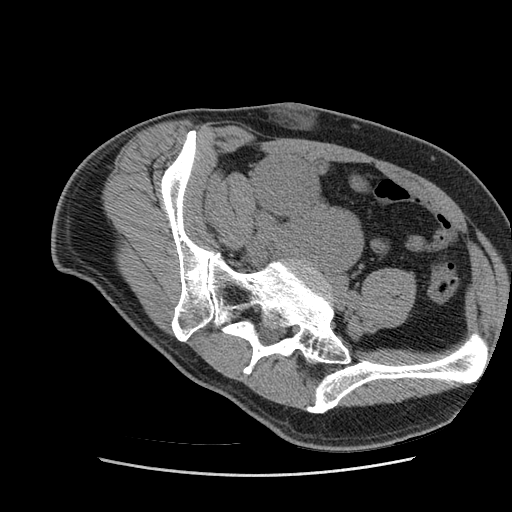
[im 38/45  lung]
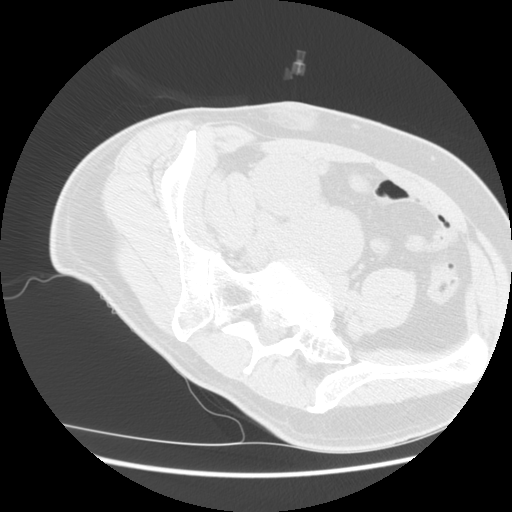
[im 40/45  lung]
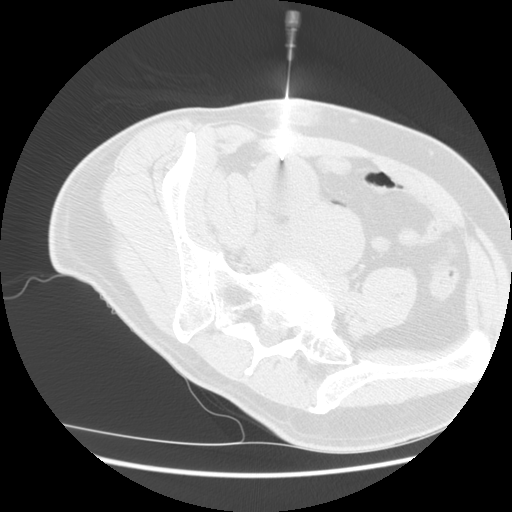
[im 42/45  soft-tissue]
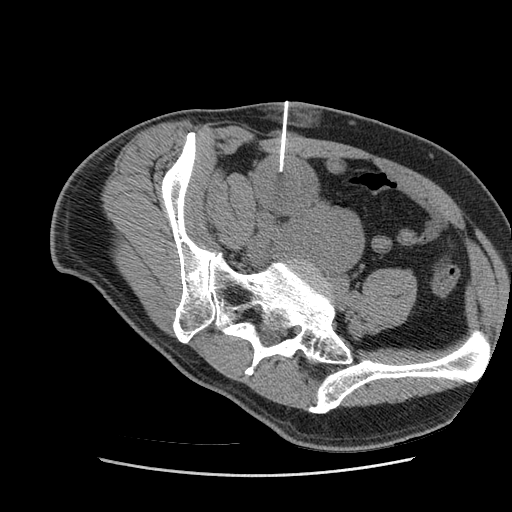
[im 42/45  lung]
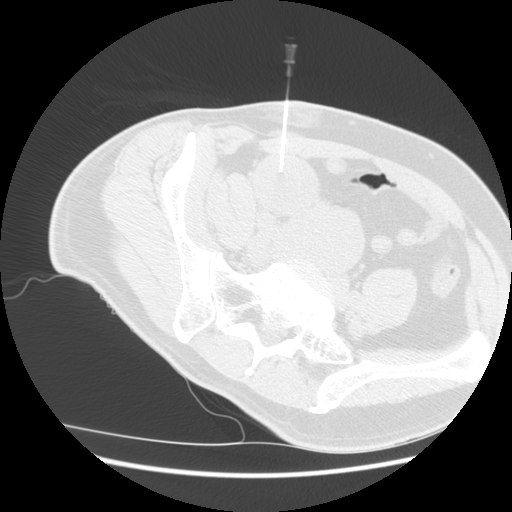

[14 of 32 positions shown; findings below may reference images not displayed]

PROCEDURE(S): CT GUIDED BIOPSY OF ABDOMINAL LYMPH NODE/MASS

Medications:Versed 3 mg, Fentanyl 150 mcg. A radiology nurse
monitored the patient for moderate sedation.

Moderate sedation time:25 minutes

CT Fluoroscopy time: 14 seconds

Procedure:The procedure was explained to the patient.  The risks
and benefits of the procedure were discussed and the patient's
questions were addressed.  Informed consent was obtained from the
patient.  The patient was placed supine in the CT scanner with the
right side was slightly elevated.  Images through the right lower
quadrant were obtained.  Large lymph node in the right lower
quadrant was identified.  The node was just posterior to the right
rectus muscles.  The anterior abdomen was prepped and draped in a
sterile fashion.  Skin was anesthetized with 1% lidocaine.  A 17
gauge needle was directed into the lesion with CT fluoroscopy.  Six
core biopsies were obtained with an 18 gauge device.  Samples were
placed in saline.  17 gauge needle was removed without
complication.
FINDINGS: Two large masses or lymph nodes in the right lower abdomen
and pelvis.  The most superficial lesion measures up to 5.4 cm and
located just underneath the right rectus muscle.  The 17 gauge
needle was successfully advanced into the lesion and six core
biopsies were obtained. The core samples were fragmented.

Complications: None
IMPRESSION: CT guided core biopsies of a right lower abdominal
mass/lymph node.

## 2012-01-29 ENCOUNTER — Encounter: Payer: Self-pay | Admitting: Nurse Practitioner

## 2012-01-29 ENCOUNTER — Ambulatory Visit (INDEPENDENT_AMBULATORY_CARE_PROVIDER_SITE_OTHER): Payer: 59 | Admitting: Nurse Practitioner

## 2012-01-29 VITALS — BP 130/92 | HR 78 | Ht 74.0 in | Wt 189.0 lb

## 2012-01-29 DIAGNOSIS — I10 Essential (primary) hypertension: Secondary | ICD-10-CM

## 2012-01-29 MED ORDER — METOPROLOL SUCCINATE ER 50 MG PO TB24: 50.0000 mg | ORAL_TABLET | Freq: Every day | ORAL | Status: DC

## 2012-01-29 NOTE — Assessment & Plan Note (Signed)
Blood pressure is mildly elevated. Goal would be less than 135/85. He is agreeable to increasing the Metoprolol to 50 mg daily. He will continue to monitor at home. He will see [place] back prn since he has follow up at least every 2 weeks at [facility]. Could consider increasing his Norvasc if needed. Would probably want to avoid ACE/ARB. Patient is agreeable to this plan and will call if any problems develop in the interim.

## 2012-01-29 NOTE — Progress Notes (Signed)
Brandon Sanders Date of Birth: 1956/04/19 Medical Record #[id number]  History of Present Illness: Brandon Sanders is seen today for a work in visit. He is seen for Brandon Sanders. He is a very pleasant 56 year old male with a history of HTN, HLD, ED and renal cell carcinoma. He has had prior nephrectomy but has had recurrence that was not amenable to surgery, chemo or radiation. He is currently in a clinical trial with Afinitor. He notes that his largest tumor has not changed and the two others have shrunk. This drug will call blood pressure to rise and will increase cholesterol levels. He is on Pravachol. He does have some renal insufficiency and says his creatinine runs around 1.6.   He comes in today. He is here alone. He is not having any chest pain or shortness of breath. He denies palpitations. He reports that back in February, Norvasc was added and metoprolol was cut back because of elevated blood pressure. He does not feel like this has worked. Blood pressures still just mildly elevated. He would like to be more proactive and prevent complications. He thought he had ED from the beta blocker. He has had no success with Viagra or Cialis. He is currently using injections with good results. He is followed regularly at [facility] and is seen practically every 2 weeks.   Current Outpatient Prescriptions on File Prior to Visit  Medication Sig Dispense Refill  . amLODipine (NORVASC) 5 MG tablet Take 1 tablet (5 mg total) by mouth daily.  90 tablet  3  . aspirin 81 MG EC tablet Take 81 mg by mouth daily.        . fenofibrate 160 MG tablet Take 1 tablet (160 mg total) by mouth daily.  90 tablet  3  . pravastatin (PRAVACHOL) 20 MG tablet Take 1 tablet (20 mg total) by mouth daily.  90 tablet  3  . tadalafil (CIALIS) 20 MG tablet Take 1 tablet (20 mg total) by mouth daily as needed for erectile dysfunction.  3 tablet  0  . vardenafil (LEVITRA) 20 MG tablet Take 1 tablet (20 mg total) by mouth as needed for erectile  dysfunction.  10 tablet  11    Allergies  Allergen Reactions  . Penicillins     Past Medical History  Diagnosis Date  . Hyperlipidemia   . HTN (hypertension)   . Erectile dysfunction     managed with injections; no response with Viagra/Cialis  . Carpal tunnel syndrome, bilateral   . Chronic renal insufficiency   . Solitary kidney, acquired   . Renal cell cancer     currently in clinical trial using Afinitor  . DJD of shoulder   . SYNCOPE 09/05/2010  . COLITIS 12/17/2007  . Palpitations 09/05/2010  . PLANTAR FASCIITIS 09/05/2010    Past Surgical History  Procedure Date  . Nephrectomy     Right  . S.p right cts release   . S/p right ulnar nerve release     History  Smoking status  . Former Smoker  Smokeless tobacco  . Not on file  Comment: quit 25 yrs ago    History  Alcohol Use No    Family History  Problem Relation Age of Onset  . Stroke Father   . Transient ischemic attack Father   . Hypertension Father 30    CABG  . Liver cancer Maternal Grandmother   . Heart disease Other     Review of Systems: The review of systems is  positive for very mild fatigue.  All other systems were reviewed and are negative.  Physical Exam: BP 130/92  Pulse 78  Ht 6\' 2"  (1.88 m)  Wt 189 lb (85.73 kg)  BMI 24.27 kg/m2 Patient is very pleasant and in no acute distress. Skin is warm and dry. Color is normal.  HEENT is unremarkable. Normocephalic/atraumatic. PERRL. Sclera are nonicteric. Neck is supple. No masses. No JVD. Lungs are clear. Cardiac exam shows a regular rate and rhythm. Abdomen is soft. Extremities are without edema. Gait and ROM are intact. No gross neurologic deficits noted.   LABORATORY DATA: Labs that were scanned from February of 2013 are reviewed. TC is 196; Trig 173; HDL 38; LDL 124     Assessment / Plan:

## 2012-01-29 NOTE — Patient Instructions (Signed)
Let's increase your Toprol to 50 mg daily  Monitor your blood pressure  Make sure you get your cuff checked.  We will see you back as needed.  Call the [facility] office at [phone] if you have any questions, problems or concerns.

## 2012-04-09 ENCOUNTER — Ambulatory Visit (INDEPENDENT_AMBULATORY_CARE_PROVIDER_SITE_OTHER): Payer: 59 | Admitting: Internal Medicine

## 2012-04-09 ENCOUNTER — Encounter: Payer: Self-pay | Admitting: Internal Medicine

## 2012-04-09 VITALS — BP 138/82 | HR 92 | Temp 97.9°F | Ht 74.0 in | Wt 187.6 lb

## 2012-04-09 DIAGNOSIS — R509 Fever, unspecified: Secondary | ICD-10-CM

## 2012-04-09 DIAGNOSIS — J189 Pneumonia, unspecified organism: Secondary | ICD-10-CM

## 2012-04-09 DIAGNOSIS — C649 Malignant neoplasm of unspecified kidney, except renal pelvis: Secondary | ICD-10-CM

## 2012-04-09 MED ORDER — AMOXICILLIN-POT CLAVULANATE 875-125 MG PO TABS: 1.0000 | ORAL_TABLET | Freq: Two times a day (BID) | ORAL | Status: AC

## 2012-04-09 MED ORDER — PROMETHAZINE-CODEINE 6.25-10 MG/5ML PO SYRP: 5.0000 mL | ORAL_SOLUTION | ORAL | Status: AC | PRN

## 2012-04-09 NOTE — Patient Instructions (Signed)
It was good to see you today. We have reviewed your prior records including labs and tests today Add Augmentin 2x/day x 10 days to ongoing Zpak Use codeine cough syrup as needed and tylenol for aches, pain and fever symptoms as discussed  Your prescription(s) have been submitted to your pharmacy. Please take as directed and contact our office if you believe you are having problem(s) with the medication(s). If fever not resolved in next 48-72h, consider Levaquin 250 mg qd antibiotics alternative if ok with [facility] doctors

## 2012-04-09 NOTE — Progress Notes (Signed)
Subjective:    Patient ID: Brandon Sanders, male    DOB: [birth year], 56 y.o.   MRN: [id number]  HPI Comments: RCC pt, on immunosuppression chemo (but holding since Sun 7/28 due to fever), followed by [facility] onc - CT chest 7/29 with RLL ground glass changes, ?early PNA - started on Zpak 7/29 but not improvement yet -  Sore Throat  This is a new problem. The current episode started in the past 7 days. The problem has been gradually worsening. The maximum temperature recorded prior to his arrival was 102 - 102.9 F. The fever has been present for 1 to 2 days. Associated symptoms include congestion, coughing, ear pain and a hoarse voice. Pertinent negatives include no abdominal pain, plugged ear sensation, shortness of breath, swollen glands or trouble swallowing. Diarrhea: chronic. He has had no exposure to strep or mono. He has tried acetaminophen (OTC expectorants, also zpack x 72h from [facility]) for the symptoms. The treatment provided no relief.  Fever  Associated symptoms include congestion, coughing and ear pain. Pertinent negatives include no abdominal pain. Diarrhea: chronic.   Past Medical History  Diagnosis Date  . Hyperlipidemia   . HTN (hypertension)   . Erectile dysfunction     managed with injections; no response with Viagra/Cialis  . Carpal tunnel syndrome, bilateral   . Chronic renal insufficiency   . Solitary kidney, acquired   . Renal cell cancer     currently in clinical trial using Afinitor  . DJD of shoulder   . SYNCOPE 09/05/2010  . COLITIS 12/17/2007  . PLANTAR FASCIITIS 09/05/2010    Review of Systems  Constitutional: Positive for fever.  HENT: Positive for ear pain, congestion and hoarse voice. Negative for trouble swallowing.   Respiratory: Positive for cough. Negative for shortness of breath.   Gastrointestinal: Negative for abdominal pain. Diarrhea: chronic.       Objective:   Physical Exam  BP 138/82  Pulse 92  Temp 97.9 F (36.6 C) (Oral)  Ht 6\' 2"  (1.88 m)   Wt 187 lb 9.6 oz (85.095 kg)  BMI 24.09 kg/m2  SpO2 97% Wt Readings from Last 3 Encounters:  04/09/12 187 lb 9.6 oz (85.095 kg)  01/29/12 189 lb (85.73 kg)  10/29/11 193 lb 6.4 oz (87.726 kg)   Constitutional:  He appears well-developed and well-nourished. No distress. Nontoxic but coughing HENT: sinus nonteder to palpation. TMs clear B without erythema or effusion, OP red but no exudate Neck: Normal range of motion. Neck supple. No JVD present. No thyromegaly present.  Cardiovascular: Normal rate, regular rhythm and normal heart sounds.  No murmur heard. no BLE edema Pulmonary/Chest: RLL crackles - but effort normal and left side breath sounds normal. No respiratory distress. no wheezes.  Neurological: he is alert and oriented to person, place, and time. No cranial nerve deficit. Coordination normal.  Skin: Skin is warm and dry.  No erythema or ulceration.  Psychiatric: he has a normal mood and affect. behavior is normal. Judgment and thought content normal.   Lab Results  Component Value Date   WBC 6.1 10/29/2011   HGB 13.3 10/29/2011   HCT 39.8 10/29/2011   PLT 282.0 10/29/2011   GLUCOSE 121* 04/11/2011   CHOL 137 12/12/2010   TRIG 82.0 12/12/2010   HDL 36.50* 12/12/2010   LDLDIRECT 90.2 10/27/2009   LDLCALC 84 12/12/2010   ALT 17 04/11/2011   AST 25 04/11/2011   NA 140 04/11/2011   K 4.5 04/11/2011   CL 100 04/11/2011  CREATININE 1.66* 04/11/2011   BUN 21 04/11/2011   CO2 29 04/11/2011   TSH 2.38 12/12/2010   PSA 1.99 10/29/2011   INR 0.92 02/16/2011   CT chest 04/07/12 - report reviewed today - RLL ground glass changes Also labs from 04/07/12 [facility] reviewed same report    Assessment & Plan:  RLL pneumonia on 04/07/12 CT (per [facility] report) -  Fever, sore throat, and Cough Immunosuppression with ongoing tx for RCC  Add augmentin to ongoing Zpak (reviewed ?PCN allergy -reports he has done well with augmentin previously) prometh with codeine for cough and pain tylenol prn follow up onc and consider  r?levaquin 250qd if still fever >72h after tx

## 2012-08-18 DIAGNOSIS — G8918 Other acute postprocedural pain: Secondary | ICD-10-CM | POA: Insufficient documentation

## 2012-09-30 ENCOUNTER — Other Ambulatory Visit: Payer: Self-pay | Admitting: Internal Medicine

## 2012-10-27 ENCOUNTER — Other Ambulatory Visit: Payer: Self-pay | Admitting: Internal Medicine

## 2012-10-27 ENCOUNTER — Telehealth: Payer: Self-pay

## 2012-10-27 MED ORDER — FENOFIBRATE 160 MG PO TABS: 160.0000 mg | ORAL_TABLET | Freq: Every day | ORAL | Status: DC

## 2012-10-27 NOTE — Telephone Encounter (Signed)
rx filled

## 2012-12-16 ENCOUNTER — Ambulatory Visit (INDEPENDENT_AMBULATORY_CARE_PROVIDER_SITE_OTHER): Payer: 59 | Admitting: Internal Medicine

## 2012-12-16 ENCOUNTER — Encounter: Payer: Self-pay | Admitting: Internal Medicine

## 2012-12-16 VITALS — BP 138/82 | HR 69 | Temp 97.9°F | Ht 74.0 in | Wt 191.0 lb

## 2012-12-16 DIAGNOSIS — Z0000 Encounter for general adult medical examination without abnormal findings: Secondary | ICD-10-CM

## 2012-12-16 MED ORDER — AMLODIPINE BESYLATE 5 MG PO TABS: ORAL_TABLET | ORAL | Status: DC

## 2012-12-16 MED ORDER — PRAVASTATIN SODIUM 20 MG PO TABS: ORAL_TABLET | ORAL | Status: DC

## 2012-12-16 MED ORDER — FENOFIBRATE 160 MG PO TABS: 160.0000 mg | ORAL_TABLET | Freq: Every day | ORAL | Status: DC

## 2012-12-16 MED ORDER — METOPROLOL SUCCINATE ER 50 MG PO TB24: 50.0000 mg | ORAL_TABLET | Freq: Every day | ORAL | Status: DC

## 2012-12-16 NOTE — Patient Instructions (Addendum)
Your ears were irrigated of wax today Your EKG was OK today Please continue all other medications as before, and refills have been done if requested. Please go to the LAB in the Basement (turn left off the elevator) for the tests to be done today You will be contacted by phone if any changes need to be made immediately.  Otherwise, you will receive a letter about your results with an explanation, but please check with MyChart first. Thank you for enrolling in MyChart. Please follow the instructions below to securely access your online medical record. MyChart allows you to send messages to your doctor, view your test results, renew your prescriptions, schedule appointments, and more. To Log into My Chart online, please go by [employer] or [employer] to [email or web address], or download the MyChart App from the [employer] of [employer] .  Your Username is: [email or web address] (pass [phone]) Please send a practice Message on Mychart later today. Please keep your appointments with your specialists as you have planned - [facility] Please return in 1 year for your yearly visit, or sooner if needed, with Lab testing done 3-5 days before

## 2012-12-16 NOTE — Assessment & Plan Note (Signed)

## 2012-12-16 NOTE — Progress Notes (Signed)
Subjective:    Patient ID: Brandon Sanders, male    DOB: May 19, 1956, 57 y.o.   MRN: [id number]  HPI  Here for wellness and f/u;  Overall doing ok;  Pt denies CP, worsening SOB, DOE, wheezing, orthopnea, PND, worsening LE edema, palpitations, dizziness or syncope.  Pt denies neurological change such as new headache, facial or extremity weakness.  Pt denies polydipsia, polyuria, or low sugar symptoms. Pt states overall good compliance with treatment and medications, good tolerability, and has been trying to follow lower cholesterol diet.  Pt denies worsening depressive symptoms, suicidal ideation or panic. No fever, night sweats, wt loss, loss of appetite, or other constitutional symptoms.  Pt states good ability with ADL's, has low fall risk, home safety reviewed and adequate, no other significant changes in hearing or vision, and only occasionally active with exercise.  S/p surgury dec 2013, [facility], wiuth 3/21 LN + for renal cell ca/chromophobe, now seeing about every 2-4 wks (as he has done for 2 yrs) involved with clinical trial for Afinitor (everolimus) Past Medical History  Diagnosis Date  . Hyperlipidemia   . HTN (hypertension)   . Erectile dysfunction     managed with injections; no response with Viagra/Cialis  . Carpal tunnel syndrome, bilateral   . Chronic renal insufficiency   . Solitary kidney, acquired   . Renal cell cancer     currently in clinical trial using Afinitor  . DJD of shoulder   . SYNCOPE 09/05/2010  . COLITIS 12/17/2007  . PLANTAR FASCIITIS 09/05/2010   Past Surgical History  Procedure Laterality Date  . Nephrectomy      Right  . S.p right cts release    . S/p right ulnar nerve release      reports that he has quit smoking. He does not have any smokeless tobacco history on file. He reports that he does not drink alcohol or use illicit drugs. family history includes Heart disease in his other; Hypertension (age of onset: 11) in his father; Liver cancer in his maternal  grandmother; Stroke in his father; and Transient ischemic attack in his father. Allergies  Allergen Reactions  . Penicillins    Current Outpatient Prescriptions on File Prior to Visit  Medication Sig Dispense Refill  . aspirin 81 MG EC tablet Take 81 mg by mouth daily.        Marland Kitchen everolimus (AFINITOR) 10 MG tablet Take 10 mg by mouth daily.       No current facility-administered medications on file prior to visit.    Review of Systems Constitutional: Negative for diaphoresis, activity change, appetite change or unexpected weight change.  HENT: Negative for hearing loss, ear pain, facial swelling, mouth sores and neck stiffness.   Eyes: Negative for pain, redness and visual disturbance.  Respiratory: Negative for shortness of breath and wheezing.   Cardiovascular: Negative for chest pain and palpitations.  Gastrointestinal: Negative for diarrhea, blood in stool, abdominal distention or other pain Genitourinary: Negative for hematuria, flank pain or change in urine volume.  Musculoskeletal: Negative for myalgias and joint swelling.  Skin: Negative for color change and wound.  Neurological: Negative for syncope and numbness. other than noted Hematological: Negative for adenopathy.  Psychiatric/Behavioral: Negative for hallucinations, self-injury, decreased concentration and agitation.      Objective:   Physical Exam BP 138/82  Pulse 69  Temp(Src) 97.9 F (36.6 C) (Oral)  Ht 6\' 2"  (1.88 m)  Wt 191 lb (86.637 kg)  BMI 24.51 kg/m2  SpO2 97% VS noted,  Constitutional: Pt is oriented to person, place, and time. Appears well-developed and well-nourished.  Head: Normocephalic and atraumatic.  Right Ear: External ear normal.  Left Ear: External ear normal.  Nose: Nose normal.  Mouth/Throat: Oropharynx is clear and moist.  Eyes: Conjunctivae and EOM are normal. Pupils are equal, round, and reactive to light.  Neck: Normal range of motion. Neck supple. No JVD present. No tracheal  deviation present.  Cardiovascular: Normal rate, regular rhythm, normal heart sounds and intact distal pulses.   Pulmonary/Chest: Effort normal and breath sounds normal.  Abdominal: Soft. Bowel sounds are normal. There is no tenderness. No HSM  Musculoskeletal: Normal range of motion. Exhibits no edema.  Lymphadenopathy:  Has no cervical adenopathy.  Neurological: Pt is alert and oriented to person, place, and time. Pt has normal reflexes. No cranial nerve deficit.  Skin: Skin is warm and dry. No rash noted. has two large scars with small NT hernias to right side/abd and low mid abdominal Psychiatric:  Has  normal mood and affect. Behavior is normal.     Assessment & Plan:

## 2013-03-10 ENCOUNTER — Telehealth: Payer: Self-pay

## 2013-03-10 DIAGNOSIS — K439 Ventral hernia without obstruction or gangrene: Secondary | ICD-10-CM

## 2013-03-10 NOTE — Telephone Encounter (Signed)
CT indicates small ventral hernia  Ok for referral - done

## 2013-03-10 NOTE — Telephone Encounter (Signed)
Should be ok, but does he mean incisional hernia, ventral hernia or inguinal hernia or other?

## 2013-03-10 NOTE — Telephone Encounter (Signed)
The patient called requesting a referral to a surgeon in the [facility] for a hernia repair requested by the patients MD at [facility].  He does not want this surgery done in , but in [place].  Call back number when referral done is [phone]

## 2013-03-10 NOTE — Telephone Encounter (Signed)
The patient is not sure which,  States it showed up on a CT.  The patient is going to fax to our side the CT report.

## 2013-03-11 NOTE — Telephone Encounter (Signed)
Patient informed. 

## 2013-03-19 ENCOUNTER — Encounter: Payer: Self-pay | Admitting: Internal Medicine

## 2013-03-23 ENCOUNTER — Ambulatory Visit (INDEPENDENT_AMBULATORY_CARE_PROVIDER_SITE_OTHER): Payer: 59 | Admitting: Surgery

## 2013-03-23 ENCOUNTER — Encounter (INDEPENDENT_AMBULATORY_CARE_PROVIDER_SITE_OTHER): Payer: Self-pay | Admitting: Surgery

## 2013-03-23 VITALS — BP 150/84 | HR 76 | Resp 14 | Ht 74.0 in | Wt 190.2 lb

## 2013-03-23 DIAGNOSIS — K432 Incisional hernia without obstruction or gangrene: Secondary | ICD-10-CM

## 2013-03-23 NOTE — Progress Notes (Signed)
Patient ID: Brandon Sanders, male   DOB: 06-Nov-1955, 57 y.o.   MRN: [id number]  Chief Complaint  Patient presents with  . New Evaluation    eval ventral hernia    HPI Brandon Sanders is a 57 y.o. male.  Patient sat request of Dr. Jonny Ruiz for incisional hernia. The patient's had previous surgery for renal cell carcinoma and right nephrectomy. He is on a investigational drug at [facility] for this. He has had a bulge in his lower midline incision and left groin which are not causing any pain. No history of nausea, vomiting or history of bowel obstruction. A bulge been present for a number of months. He feels well overall. HPI  Past Medical History  Diagnosis Date  . Hyperlipidemia   . HTN (hypertension)   . Erectile dysfunction     managed with injections; no response with Viagra/Cialis  . Carpal tunnel syndrome, bilateral   . Chronic renal insufficiency   . Solitary kidney, acquired   . Renal cell cancer     currently in clinical trial using Afinitor  . DJD of shoulder   . SYNCOPE 09/05/2010  . COLITIS 12/17/2007  . PLANTAR FASCIITIS 09/05/2010    Past Surgical History  Procedure Laterality Date  . Nephrectomy      Right  . S.p right cts release    . S/p right ulnar nerve release      Family History  Problem Relation Age of Onset  . Stroke Father   . Transient ischemic attack Father   . Hypertension Father 90    CABG  . Liver cancer Maternal Grandmother   . Heart disease Other     Social History History  Substance Use Topics  . Smoking status: Never Smoker   . Smokeless tobacco: Never Used     Comment: quit 25 yrs ago  . Alcohol Use: No    Allergies  Allergen Reactions  . Penicillins     Current Outpatient Prescriptions  Medication Sig Dispense Refill  . amLODipine (NORVASC) 5 MG tablet TAKE 1 TABLET EVERY DAY  90 tablet  3  . aspirin 81 MG EC tablet Take 81 mg by mouth daily.        Marland Kitchen everolimus (AFINITOR) 10 MG tablet Take 10 mg by mouth daily.      .  fenofibrate 160 MG tablet Take 1 tablet (160 mg total) by mouth daily.  90 tablet  3  . metoprolol succinate (TOPROL-XL) 50 MG 24 hr tablet Take 1 tablet (50 mg total) by mouth daily. Take with or immediately following a meal.  90 tablet  3  . pravastatin (PRAVACHOL) 20 MG tablet TAKE 1 TABLET EVERY DAY  90 tablet  3   No current facility-administered medications for this visit.    Review of Systems Review of Systems  Constitutional: Negative.   HENT: Negative.   Eyes: Negative.   Cardiovascular: Negative.   Gastrointestinal: Negative for abdominal pain.  Endocrine: Negative.   Genitourinary: Negative.   Musculoskeletal: Negative.   Skin: Negative.   Allergic/Immunologic: Negative.   Neurological: Negative.   Hematological: Negative.   Psychiatric/Behavioral: Negative.     Blood pressure 150/84, pulse 76, resp. rate 14, height 6\' 2"  (1.88 m), weight 190 lb 3.2 oz (86.274 kg).  Physical Exam Physical Exam  Constitutional: He is oriented to person, place, and time. He appears well-developed and well-nourished.  HENT:  Head: Normocephalic and atraumatic.  Eyes: EOM are normal. Pupils are equal, round, and reactive  to light.  Neck: Normal range of motion. Neck supple.  Pulmonary/Chest: Effort normal and breath sounds normal.  Abdominal:    Musculoskeletal: Normal range of motion.  Neurological: He is alert and oriented to person, place, and time.  Skin: Skin is warm and dry.  Psychiatric: He has a normal mood and affect. His behavior is normal. Judgment and thought content normal.    Data Reviewed CT from [facility]  Assessment    Incisional hernia with significant midline abdominal wall diastases without complicating features    Plan    Observation for now since he is on a clinical trial drug that could complicate surgical wound healing. He is on this drug for one year. He is having no pain or other symptoms. Return to clinic as needed. He may also have a small right  inguinal hernia but I cannot tell if this is part of the lower incisional hernia were separate. Any event hernias are reducible and nontender. He is not limited in his daily activities. Instructions given about hernia and call if he has more pain for other issues.       Winn Muehl,Akili A. 03/23/2013, 4:15 PM

## 2013-03-23 NOTE — Patient Instructions (Signed)
Hernia A hernia occurs when an internal organ pushes out through a weak spot in the abdominal wall. Hernias most commonly occur in the groin and around the navel. Hernias often can be pushed back into place (reduced). Most hernias tend to get worse over time. Some abdominal hernias can get stuck in the opening (irreducible or incarcerated hernia) and cannot be reduced. An irreducible abdominal hernia which is tightly squeezed into the opening is at risk for impaired blood supply (strangulated hernia). A strangulated hernia is a medical emergency. Because of the risk for an irreducible or strangulated hernia, surgery may be recommended to repair a hernia. CAUSES   Heavy lifting.  Prolonged coughing.  Straining to have a bowel movement.  A cut (incision) made during an abdominal surgery. HOME CARE INSTRUCTIONS   Bed rest is not required. You may continue your normal activities.  Avoid lifting more than 10 pounds (4.5 kg) or straining.  Cough gently. If you are a smoker it is best to stop. Even the best hernia repair can break down with the continual strain of coughing. Even if you do not have your hernia repaired, a cough will continue to aggravate the problem.  Do not wear anything tight over your hernia. Do not try to keep it in with an outside bandage or truss. These can damage abdominal contents if they are trapped within the hernia sac.  Eat a normal diet.  Avoid constipation. Straining over long periods of time will increase hernia size and encourage breakdown of repairs. If you cannot do this with diet alone, stool softeners may be used. SEEK IMMEDIATE MEDICAL CARE IF:   You have a fever.  You develop increasing abdominal pain.  You feel nauseous or vomit.  Your hernia is stuck outside the abdomen, looks discolored, feels hard, or is tender.  You have any changes in your bowel habits or in the hernia that are unusual for you.  You have increased pain or swelling around the  hernia.  You cannot push the hernia back in place by applying gentle pressure while lying down. MAKE SURE YOU:   Understand these instructions.  Will watch your condition.  Will get help right away if you are not doing well or get worse. Document Released: 08/27/2005 Document Revised: 11/19/2011 Document Reviewed: 04/15/2008 [employer] Patient Information 2014 [employer].  

## 2013-06-09 DIAGNOSIS — K432 Incisional hernia without obstruction or gangrene: Secondary | ICD-10-CM | POA: Insufficient documentation

## 2013-07-07 ENCOUNTER — Encounter: Payer: Self-pay | Admitting: Internal Medicine

## 2013-07-07 ENCOUNTER — Ambulatory Visit (INDEPENDENT_AMBULATORY_CARE_PROVIDER_SITE_OTHER): Payer: 59 | Admitting: Internal Medicine

## 2013-07-07 VITALS — BP 132/77 | HR 74 | Temp 97.6°F | Ht 74.0 in | Wt 192.4 lb

## 2013-07-07 DIAGNOSIS — G501 Atypical facial pain: Secondary | ICD-10-CM

## 2013-07-07 DIAGNOSIS — I10 Essential (primary) hypertension: Secondary | ICD-10-CM

## 2013-07-07 MED ORDER — CARBAMAZEPINE 200 MG PO TABS: 200.0000 mg | ORAL_TABLET | Freq: Three times a day (TID) | ORAL | Status: DC

## 2013-07-07 NOTE — Assessment & Plan Note (Signed)
stable overall by history and exam, recent data reviewed with pt, and pt to continue medical treatment as before,  to f/u any worsening symptoms or concerns BP Readings from Last 3 Encounters:  07/07/13 132/77  03/23/13 150/84  12/16/12 138/82

## 2013-07-07 NOTE — Progress Notes (Addendum)
Subjective:    Patient ID: Brandon Sanders, male    DOB: 1955-12-25, 57 y.o.   MRN: [id number]  HPI  Here to f/u ; c/o pain to left maxillary sinus area, seems to maybe involve the teeth, started about sept, has been on Chemo at [facility] for met renal cell ca for approx 2 yrs, this last spring had signifcant allergy sinus symptoms so started otc claritin but then upper teeth began pain, saw family dentist, teeth seemed ok, suggested and endodontist but pt deferred at the time, but pain worsened, event saw endodontist with xray neg, referred for Cone Beam CT (not the maxillofacial CT) which is a specific dental teeth CT, since was expensive, pt deferred and saw Dr Darlyn Read - maxillofacial CT done - neg per pt, exam benign, so then went back for the cone beam CT - did apparently have an infection tooth #14, then root canal done, seen again yest with benign exam, seems to be healing, no post op infection or other complication.  Pain helped with advil but taking 600 mg every 4 hrs, realizing it can be related to worsening renal fxn, only has one kidney, This am, tried tylenol 1000 mg to try to avoid more advil but did not work as well.  Past Medical History  Diagnosis Date  . Hyperlipidemia   . HTN (hypertension)   . Erectile dysfunction     managed with injections; no response with Viagra/Cialis  . Carpal tunnel syndrome, bilateral   . Chronic renal insufficiency   . Solitary kidney, acquired   . Renal cell cancer     currently in clinical trial using Afinitor  . DJD of shoulder   . SYNCOPE 09/05/2010  . COLITIS 12/17/2007  . PLANTAR FASCIITIS 09/05/2010   Past Surgical History  Procedure Laterality Date  . Nephrectomy      Right  . S.p right cts release    . S/p right ulnar nerve release      reports that he has never smoked. He has never used smokeless tobacco. He reports that he does not drink alcohol or use illicit drugs. family history includes Heart disease in his other; Hypertension (age  of onset: 34) in his father; Liver cancer in his maternal grandmother; Stroke in his father; Transient ischemic attack in his father. Allergies  Allergen Reactions  . Penicillins    Current Outpatient Prescriptions on File Prior to Visit  Medication Sig Dispense Refill  . amLODipine (NORVASC) 5 MG tablet TAKE 1 TABLET EVERY DAY  90 tablet  3  . aspirin 81 MG EC tablet Take 81 mg by mouth daily.        Marland Kitchen everolimus (AFINITOR) 10 MG tablet Take 10 mg by mouth daily.      . fenofibrate 160 MG tablet Take 1 tablet (160 mg total) by mouth daily.  90 tablet  3  . metoprolol succinate (TOPROL-XL) 50 MG 24 hr tablet Take 1 tablet (50 mg total) by mouth daily. Take with or immediately following a meal.  90 tablet  3  . pravastatin (PRAVACHOL) 20 MG tablet TAKE 1 TABLET EVERY DAY  90 tablet  3   No current facility-administered medications on file prior to visit.    Review of Systems  Constitutional: Negative for unexpected weight change, or unusual diaphoresis  HENT: Negative for tinnitus.   Eyes: Negative for photophobia and visual disturbance.  Respiratory: Negative for choking and stridor.   Gastrointestinal: Negative for vomiting and blood in stool.  Genitourinary:  Negative for hematuria and decreased urine volume.  Musculoskeletal: Negative for acute joint swelling Skin: Negative for color change and wound.  Neurological: Negative for tremors and numbness other than noted  Psychiatric/Behavioral: Negative for decreased concentration or  hyperactivity.       Objective:   Physical Exam BP 132/77  Pulse 74  Temp(Src) 97.6 F (36.4 C) (Oral)  Ht 6\' 2"  (1.88 m)  Wt 192 lb 6 oz (87.261 kg)  BMI 24.69 kg/m2  SpO2 98% VS noted,  Constitutional: Pt appears well-developed and well-nourished.  HENT: Head: NCAT.  Right Ear: External ear normal.  Left Ear: External ear normal.  Eyes: Conjunctivae and EOM are normal. Pupils are equal, round, and reactive to light.  Neck: Normal range of  motion. Neck supple.  Cardiovascular: Normal rate and regular rhythm.   Pulmonary/Chest: Effort normal and breath sounds normal.  Abd:  Soft, NT, non-distended, + BS Neurological: Pt is alert. Not confused , cn 2-12 intact, motor 5/5 throughout, ? Some dysesthetic discomfort left maxillary sinus area Skin: Skin is warm. No erythema.  Psychiatric: Pt behavior is normal. Thought content normal.     Assessment & Plan:

## 2013-07-07 NOTE — Assessment & Plan Note (Addendum)
I suspect trigeminal neuralgia, for tegretol trial, consider MRI head, neurology referral  Note:  Total time for pt hx, exam, review of record with pt in the room, determination of diagnoses and plan for further eval and tx is > 30 min, with over 50% spent in coordination and counseling of patient

## 2013-07-07 NOTE — Patient Instructions (Signed)
Please take all new medication as prescribed Please continue all other medications as before, and refills have been done if requested.  Please call in 3 days if not improved, to consider neurology referral

## 2013-07-09 ENCOUNTER — Telehealth: Payer: Self-pay | Admitting: Internal Medicine

## 2013-07-09 NOTE — Telephone Encounter (Signed)
Patient informed. 

## 2013-07-09 NOTE — Telephone Encounter (Signed)
07/09/2013  Pt left message stating that the RX he received from his recent office visit has helped, but he is still in pain.  pt states the meds work for 2 hours then fade very quickly.  Pt wants to be advised.  Pt# [phone]

## 2013-07-09 NOTE — Telephone Encounter (Signed)
Called the patient informed of MD instructions.  The patient stated he has been doing tid as that is what instructions on bottle say to do.

## 2013-07-09 NOTE — Telephone Encounter (Signed)
Ok to go to tid for now, although this is often done after one wk of the bid dosing;  The max dose is often 200 qid, so he can let us know next wk if he thinks he needs qid;  He should eventually call when needs refill and let us know if he does better at tid or qid  Also, there is a tegretol XR, but may be more expensive  He could consider asking his pharmacist if this would be to change to eventually, if not cost prohibitive

## 2013-07-09 NOTE — Telephone Encounter (Signed)
Called the patient and the tegretol does help (for approximately 2 hrs.) , then he is back to taking advil.  Please advise if dose needs to be increased or does he just need to give it more time??

## 2013-07-09 NOTE — Telephone Encounter (Signed)
Ok to go to qid

## 2013-07-16 ENCOUNTER — Other Ambulatory Visit: Payer: Self-pay

## 2013-11-23 DIAGNOSIS — C779 Secondary and unspecified malignant neoplasm of lymph node, unspecified: Secondary | ICD-10-CM | POA: Insufficient documentation

## 2013-11-24 ENCOUNTER — Telehealth: Payer: Self-pay

## 2013-11-24 DIAGNOSIS — Z0000 Encounter for general adult medical examination without abnormal findings: Secondary | ICD-10-CM

## 2013-11-24 NOTE — Telephone Encounter (Signed)
Cpx labs entered. 

## 2014-01-04 ENCOUNTER — Other Ambulatory Visit: Payer: Self-pay | Admitting: Internal Medicine

## 2014-01-28 ENCOUNTER — Ambulatory Visit (INDEPENDENT_AMBULATORY_CARE_PROVIDER_SITE_OTHER): Payer: 59 | Admitting: Internal Medicine

## 2014-01-28 ENCOUNTER — Encounter: Payer: Self-pay | Admitting: Internal Medicine

## 2014-01-28 VITALS — BP 132/80 | HR 78 | Temp 98.7°F | Ht 74.0 in | Wt 185.0 lb

## 2014-01-28 DIAGNOSIS — N289 Disorder of kidney and ureter, unspecified: Secondary | ICD-10-CM

## 2014-01-28 DIAGNOSIS — C649 Malignant neoplasm of unspecified kidney, except renal pelvis: Secondary | ICD-10-CM

## 2014-01-28 DIAGNOSIS — I10 Essential (primary) hypertension: Secondary | ICD-10-CM

## 2014-01-28 DIAGNOSIS — J209 Acute bronchitis, unspecified: Secondary | ICD-10-CM | POA: Insufficient documentation

## 2014-01-28 MED ORDER — LEVOFLOXACIN 500 MG PO TABS: 500.0000 mg | ORAL_TABLET | Freq: Every day | ORAL | Status: DC

## 2014-01-28 MED ORDER — HYDROCODONE-HOMATROPINE 5-1.5 MG/5ML PO SYRP: 5.0000 mL | ORAL_SOLUTION | Freq: Four times a day (QID) | ORAL | Status: DC | PRN

## 2014-01-28 NOTE — Assessment & Plan Note (Signed)
Volume stable, for f/u labs next visit - June 25

## 2014-01-28 NOTE — Progress Notes (Signed)
Pre visit review using our clinic review tool, if applicable. No additional management support is needed unless otherwise documented below in the visit note. 

## 2014-01-28 NOTE — Assessment & Plan Note (Signed)
Now re-started on affinitor x 10 wks, for f/u ct's at [place] next month

## 2014-01-28 NOTE — Patient Instructions (Signed)
Please take all new medication as prescribed - the antibiotic, and cough medicine if needed  Please continue all other medications as before, and refills have been done if requested. Please have the pharmacy call with any other refills you may need.  Please keep your appointments with your specialists as you have planned

## 2014-01-28 NOTE — Progress Notes (Signed)
Subjective:    Patient ID: Brandon Sanders, male    DOB: [birth year], 57 y.o.   MRN: [id number]  HPI  Here to f/u, was in remission and off affinitor for 15 months, more recently now back on affinitor for last 10 wks with recurrent disease.  Did not take today as he has new symtpoms, has hx pna x 2 previously on the med. Rarely other wise gets sick. No fever but feels warm, with prod cough, general weakness Past Medical History  Diagnosis Date  . Hyperlipidemia   . HTN (hypertension)   . Erectile dysfunction     managed with injections; no response with Viagra/Cialis  . Carpal tunnel syndrome, bilateral   . Chronic renal insufficiency   . Solitary kidney, acquired   . Renal cell cancer     currently in clinical trial using Afinitor  . DJD of shoulder   . SYNCOPE 09/05/2010  . COLITIS 12/17/2007  . PLANTAR FASCIITIS 09/05/2010   Past Surgical History  Procedure Laterality Date  . Nephrectomy      Right  . S.p right cts release    . S/p right ulnar nerve release      reports that he has never smoked. He has never used smokeless tobacco. He reports that he does not drink alcohol or use illicit drugs. family history includes Heart disease in his other; Hypertension (age of onset: 79) in his father; Liver cancer in his maternal grandmother; Stroke in his father; Transient ischemic attack in his father. Allergies  Allergen Reactions  . Penicillins    Current Outpatient Prescriptions on File Prior to Visit  Medication Sig Dispense Refill  . amLODipine (NORVASC) 5 MG tablet Take 1 tablet by mouth  every day  90 tablet  0  . aspirin 81 MG EC tablet Take 81 mg by mouth daily.        . carbamazepine (TEGRETOL) 200 MG tablet Take 1 tablet (200 mg total) by mouth 3 (three) times daily.  60 tablet  5  . everolimus (AFINITOR) 10 MG tablet Take 10 mg by mouth daily.      . fenofibrate 160 MG tablet Take 1 tablet by mouth  daily  90 tablet  0  . metoprolol succinate (TOPROL-XL) 50 MG 24 hr  tablet Take 1 tablet by mouth  daily with or immediately  following a meal  90 tablet  0  . pravastatin (PRAVACHOL) 20 MG tablet Take 1 tablet by mouth  every day  90 tablet  0   No current facility-administered medications on file prior to visit.   Review of Systems  Constitutional: Negative for unusual diaphoresis or other sweats  HENT: Negative for ringing in ear Eyes: Negative for double vision or worsening visual disturbance.  Respiratory: Negative for choking and stridor.   Gastrointestinal: Negative for vomiting or other signifcant bowel change Genitourinary: Negative for hematuria or decreased urine volume.  Musculoskeletal: Negative for other MSK pain or swelling Skin: Negative for color change and worsening wound.  Neurological: Negative for tremors and numbness other than noted  Psychiatric/Behavioral: Negative for decreased concentration or agitation other than above       Objective:   Physical Exam BP 132/80  Pulse 78  Temp(Src) 98.7 F (37.1 C) (Oral)  Ht 6\' 2"  (1.88 m)  Wt 185 lb (83.915 kg)  BMI 23.74 kg/m2  SpO2 97% VS noted, mild ill Constitutional: Pt appears well-developed, well-nourished.  HENT: Head: NCAT.  Right Ear: External ear normal.  Left  Ear: External ear normal.  Eyes: . Pupils are equal, round, and reactive to light. Conjunctivae and EOM are normal Neck: Normal range of motion. Neck supple.  Cardiovascular: Normal rate and regular rhythm.   Pulmonary/Chest: Effort normal and breath sounds decreased, no rales or wheezing.  Abd:  Soft, NT, ND, + BS Neurological: Pt is alert. Not confused , motor grossly intact Skin: Skin is warm. No rash Psychiatric: Pt behavior is normal. No agitation.     Assessment & Plan:

## 2014-01-28 NOTE — Assessment & Plan Note (Signed)
Mild to mod, for antibx course,  to f/u any worsening symptoms or concerns 

## 2014-01-28 NOTE — Assessment & Plan Note (Signed)
stable overall by history and exam, recent data reviewed with pt, and pt to continue medical treatment as before,  to f/u any worsening symptoms or concerns BP Readings from Last 3 Encounters:  01/28/14 132/80  07/07/13 132/77  03/23/13 150/84

## 2014-03-04 ENCOUNTER — Ambulatory Visit (INDEPENDENT_AMBULATORY_CARE_PROVIDER_SITE_OTHER): Payer: 59 | Admitting: Internal Medicine

## 2014-03-04 ENCOUNTER — Encounter: Payer: Self-pay | Admitting: Internal Medicine

## 2014-03-04 VITALS — BP 140/90 | HR 75 | Temp 98.4°F | Ht 74.0 in | Wt 182.5 lb

## 2014-03-04 DIAGNOSIS — E7800 Pure hypercholesterolemia, unspecified: Secondary | ICD-10-CM

## 2014-03-04 DIAGNOSIS — N402 Nodular prostate without lower urinary tract symptoms: Secondary | ICD-10-CM

## 2014-03-04 DIAGNOSIS — Z0000 Encounter for general adult medical examination without abnormal findings: Secondary | ICD-10-CM

## 2014-03-04 MED ORDER — METOPROLOL SUCCINATE ER 50 MG PO TB24: 50.0000 mg | ORAL_TABLET | Freq: Every day | ORAL | Status: DC

## 2014-03-04 MED ORDER — FENOFIBRATE 160 MG PO TABS: 160.0000 mg | ORAL_TABLET | Freq: Every day | ORAL | Status: DC

## 2014-03-04 MED ORDER — PRAVASTATIN SODIUM 20 MG PO TABS: 20.0000 mg | ORAL_TABLET | Freq: Every day | ORAL | Status: DC

## 2014-03-04 MED ORDER — PRAVASTATIN SODIUM 40 MG PO TABS: 40.0000 mg | ORAL_TABLET | Freq: Every day | ORAL | Status: DC

## 2014-03-04 MED ORDER — AMLODIPINE BESYLATE 5 MG PO TABS: 5.0000 mg | ORAL_TABLET | Freq: Every day | ORAL | Status: DC

## 2014-03-04 NOTE — Assessment & Plan Note (Signed)
For f/u [facility] as planned

## 2014-03-04 NOTE — Assessment & Plan Note (Signed)

## 2014-03-04 NOTE — Progress Notes (Signed)
Pre visit review using our clinic review tool, if applicable. No additional management support is needed unless otherwise documented below in the visit note. 

## 2014-03-04 NOTE — Progress Notes (Signed)
Subjective:    Patient ID: Brandon Sanders, male    DOB: Apr 27, 1956, 58 y.o.   MRN: [id number]  HPI  Here for wellness and f/u;  Overall doing ok;  Pt denies CP, worsening SOB, DOE, wheezing, orthopnea, PND, worsening LE edema, palpitations, dizziness or syncope.  Pt denies neurological change such as new headache, facial or extremity weakness.  Pt denies polydipsia, polyuria, or low sugar symptoms. Pt states overall good compliance with treatment and medications, good tolerability, and has been trying to follow lower cholesterol diet.  Pt denies worsening depressive symptoms, suicidal ideation or panic. No fever, night sweats, wt loss, loss of appetite, or other constitutional symptoms.  Pt states good ability with ADL's, has low fall risk, home safety reviewed and adequate, no other significant changes in hearing or vision, and only occasionally active with exercise.  Recent MRI abd/pelvis done 1 wk ago with neg for suspcious renal cell return, but new left postlat prostate ? Nodule vs scarring - to f/u with Dr Lorayne Marek. No current complaints Past Medical History  Diagnosis Date  . Hyperlipidemia   . HTN (hypertension)   . Erectile dysfunction     managed with injections; no response with Viagra/Cialis  . Carpal tunnel syndrome, bilateral   . Chronic renal insufficiency   . Solitary kidney, acquired   . Renal cell cancer     currently in clinical trial using Afinitor  . DJD of shoulder   . SYNCOPE 09/05/2010  . COLITIS 12/17/2007  . PLANTAR FASCIITIS 09/05/2010   Past Surgical History  Procedure Laterality Date  . Nephrectomy      Right  . S.p right cts release    . S/p right ulnar nerve release      reports that he has never smoked. He has never used smokeless tobacco. He reports that he does not drink alcohol or use illicit drugs. family history includes Heart disease in his other; Hypertension (age of onset: 14) in his father; Liver cancer in his maternal  grandmother; Stroke in his father; Transient ischemic attack in his father. Allergies  Allergen Reactions  . Penicillins    Current Outpatient Prescriptions on File Prior to Visit  Medication Sig Dispense Refill  . aspirin 81 MG EC tablet Take 81 mg by mouth daily.        . carbamazepine (TEGRETOL) 200 MG tablet Take 1 tablet (200 mg total) by mouth 3 (three) times daily.  60 tablet  5  . everolimus (AFINITOR) 10 MG tablet Take 10 mg by mouth daily.       No current facility-administered medications on file prior to visit.    Review of Systems Constitutional: Negative for increased diaphoresis, other activity, appetite or other siginficant weight change  HENT: Negative for worsening hearing loss, ear pain, facial swelling, mouth sores and neck stiffness.   Eyes: Negative for other worsening pain, redness or visual disturbance.  Respiratory: Negative for shortness of breath and wheezing.   Cardiovascular: Negative for chest pain and palpitations.  Gastrointestinal: Negative for diarrhea, blood in stool, abdominal distention or other pain Genitourinary: Negative for hematuria, flank pain or change in urine volume.  Musculoskeletal: Negative for myalgias or other joint complaints.  Skin: Negative for color change and wound.  Neurological: Negative for syncope and numbness. other than noted Hematological: Negative for adenopathy. or other swelling Psychiatric/Behavioral: Negative for hallucinations, self-injury, decreased concentration or other worsening agitation.      Objective:   Physical Exam  Assessment & Plan:

## 2014-03-04 NOTE — Assessment & Plan Note (Signed)
Goal ldl < 100 - to increase the pravastatin to 40 mg qd

## 2014-03-04 NOTE — Patient Instructions (Signed)
OK to increase the pravachol to 40 mg per day  Please continue all other medications as before, and refills have been done if requested.  Please have the pharmacy call with any other refills you may need.  Please continue your efforts at being more active, low cholesterol diet, and weight control.  You are otherwise up to date with prevention measures today.  Please keep your appointments with your specialists as you may have planned  No further lab work or other studies needed today  Please return in 1 year for your yearly visit, or sooner if needed, with Lab testing done 3-5 days before

## 2014-03-15 ENCOUNTER — Ambulatory Visit (INDEPENDENT_AMBULATORY_CARE_PROVIDER_SITE_OTHER): Payer: 59 | Admitting: Internal Medicine

## 2014-03-15 ENCOUNTER — Other Ambulatory Visit (INDEPENDENT_AMBULATORY_CARE_PROVIDER_SITE_OTHER): Payer: 59

## 2014-03-15 ENCOUNTER — Other Ambulatory Visit: Payer: 59

## 2014-03-15 ENCOUNTER — Encounter: Payer: Self-pay | Admitting: Internal Medicine

## 2014-03-15 VITALS — BP 138/82 | HR 94 | Temp 98.5°F | Wt 183.0 lb

## 2014-03-15 DIAGNOSIS — J029 Acute pharyngitis, unspecified: Secondary | ICD-10-CM

## 2014-03-15 DIAGNOSIS — Z5111 Encounter for antineoplastic chemotherapy: Secondary | ICD-10-CM

## 2014-03-15 DIAGNOSIS — Z79899 Other long term (current) drug therapy: Secondary | ICD-10-CM

## 2014-03-15 DIAGNOSIS — C649 Malignant neoplasm of unspecified kidney, except renal pelvis: Secondary | ICD-10-CM

## 2014-03-15 LAB — CBC WITH DIFFERENTIAL/PLATELET
BASOS ABS: 0 10*3/uL (ref 0.0–0.1)
Basophils Relative: 0.4 % (ref 0.0–3.0)
Eosinophils Absolute: 0 10*3/uL (ref 0.0–0.7)
Eosinophils Relative: 0.3 % (ref 0.0–5.0)
HEMATOCRIT: 37.2 % — AB (ref 39.0–52.0)
Hemoglobin: 12.5 g/dL — ABNORMAL LOW (ref 13.0–17.0)
LYMPHS ABS: 0.9 10*3/uL (ref 0.7–4.0)
Lymphocytes Relative: 13.9 % (ref 12.0–46.0)
MCHC: 33.6 g/dL (ref 30.0–36.0)
MCV: 80.8 fl (ref 78.0–100.0)
Monocytes Absolute: 0.6 10*3/uL (ref 0.1–1.0)
Monocytes Relative: 9.6 % (ref 3.0–12.0)
Neutro Abs: 4.7 10*3/uL (ref 1.4–7.7)
Neutrophils Relative %: 75.8 % (ref 43.0–77.0)
Platelets: 238 10*3/uL (ref 150.0–400.0)
RBC: 4.6 Mil/uL (ref 4.22–5.81)
RDW: 14.1 % (ref 11.5–15.5)
WBC: 6.2 10*3/uL (ref 4.0–10.5)

## 2014-03-15 MED ORDER — AZITHROMYCIN 250 MG PO TABS: ORAL_TABLET | ORAL | Status: DC

## 2014-03-15 NOTE — Patient Instructions (Signed)
Your next office appointment will be determined based upon review of your pending labs . Those instructions will be transmitted to you through My Chart. Followup as needed for your acute issue. Please report any significant change in your symptoms. NSAIDS ( Aleve, Advil, Naproxen) or Tylenol every 4 hrs as needed for fever as discussed based on label recommendations

## 2014-03-15 NOTE — Progress Notes (Signed)
Pre visit review using our clinic review tool, if applicable. No additional management support is needed unless otherwise documented below in the visit note. 

## 2014-03-15 NOTE — Progress Notes (Signed)
   Subjective:    Patient ID: Brandon Sanders, male    DOB: [birth year], 57 y.o.   MRN: [id number]  HPI   Symptoms began 03/13/14 as a sore throat and rhinitis with clear drainage  This was associated with fever, chills, sweats a temperature up to 101. He employed nonsteroidals with some benefit .  At this time he continues to have a sore throat associated with malaise. He has some itching of the left eye without any purulent discharge  With fever he has had myalgias as well.  Significantly he has renal cell cancer and is on oral chemotherapeutic agent,Afinitor.  He has a history of penicillin "allergy" from childhood. The specifics are not clear  Additionally he is on Tegretol 200 mg 3 times a day.  Has never smoked       Review of Systems   He specifically denies frontal headache, facial pain, nasal purulence, dental pain, otic pain, otic discharge.  He has no cough, sputum production, wheezing, or shortness of breath  Itchy eyes and not associated with watery eyes or sneezing.     Objective:   Physical Exam  Significant or distinguishing  findings on physical exam are documented first.  Below that are other systems examined & findings.  There is a flat whitish irregular patch over the right tonsil. There is a small inclusion on the left. An S4 is present.  General appearance:good health ;well nourished; no acute distress or increased work of breathing is present.  No  lymphadenopathy about the head, neck, or axilla noted.   Eyes: No conjunctival inflammation or lid edema is present. There is no scleral icterus.  Ears:  External ear exam shows no significant lesions or deformities.  Otoscopic examination reveals clear canals, tympanic membranes are intact bilaterally without bulging, retraction, inflammation or discharge.  Nose:  External nasal examination shows no deformity or inflammation. Nasal mucosa are pink and moist without lesions or exudates. No septal  dislocation or deviation.No obstruction to airflow.   Oral exam: Dental hygiene is good; lips and gums are healthy appearing.There is no oropharyngeal erythema or exudate noted.   Neck:  No deformities, thyromegaly, masses, or tenderness noted.   Supple but slightly decreased range of motion without pain.   Heart:  Normal rate and regular rhythm. S1 and S2 normal without gallop, murmur, click, rub or other extra sounds.   Lungs:Chest clear to auscultation; no wheezes, rhonchi,rales ,or rubs present.No increased work of breathing.     Abdomen: bowel sounds normal, soft and non-tender without masses, organomegaly or hernias noted.  No guarding or rebound  Extremities:  No cyanosis, edema, or clubbing  noted .  Negative straight leg raising bilaterally no meningeal signs.  Skin: Warm & dry w/o jaundice or tenting.          Assessment & Plan:  #1 pharyngitis  #2 anti-neoplastic chemotherapy; R/O immunocompromise  #3 history renal cell cancer  #4 history of penicillin allergy  #5 Tegretol therapy whiv\ch limits antibiotic options  See orders & AVS

## 2014-03-15 NOTE — Addendum Note (Signed)
Addended by: Roma Schanz R on: 03/15/2014 02:24 PM   Modules accepted: Orders

## 2014-03-16 NOTE — Addendum Note (Signed)
Addended by: Gala Lewandowsky B on: 03/16/2014 09:15 AM   Modules accepted: Orders

## 2014-03-18 ENCOUNTER — Ambulatory Visit (INDEPENDENT_AMBULATORY_CARE_PROVIDER_SITE_OTHER): Payer: 59 | Admitting: Internal Medicine

## 2014-03-18 ENCOUNTER — Telehealth: Payer: Self-pay | Admitting: *Deleted

## 2014-03-18 ENCOUNTER — Encounter: Payer: Self-pay | Admitting: Internal Medicine

## 2014-03-18 VITALS — BP 140/86 | HR 87 | Temp 98.6°F

## 2014-03-18 DIAGNOSIS — C649 Malignant neoplasm of unspecified kidney, except renal pelvis: Secondary | ICD-10-CM

## 2014-03-18 DIAGNOSIS — K1231 Oral mucositis (ulcerative) due to antineoplastic therapy: Secondary | ICD-10-CM

## 2014-03-18 DIAGNOSIS — T451X5A Adverse effect of antineoplastic and immunosuppressive drugs, initial encounter: Secondary | ICD-10-CM

## 2014-03-18 NOTE — Telephone Encounter (Signed)
Left msg on triage stating he is not feeling no better been taking the antibiotic that was rx no change with sxs. Pt is wanting his throat culture results...Brandon Sanders

## 2014-03-18 NOTE — Progress Notes (Signed)
   Subjective:    Patient ID: Brandon Sanders, male    DOB: Nov 11, 1955, 58 y.o.   MRN: [id number]  HPI  He has finished the Z-Pak and has had no improvement in the sore throat. His labs performed 03/15/14 showed mild anemia but normal white count. Culture collected 7/6 was not processed. Issue was addressed with the lab.  He continues to have fever with associated myalgias. Temperature has been as high as 102.  Now he has multiple mouth ulcers which were not previously present  The ulcers have impacted his ability to eat due to the pain.    Review of Systems  He has no other localizing signs for the fever.  Specifically there no symptoms of rhinosinusitis. He's had minor pressure in the right ear without pain or discharge  He has no cough or sputum production  There is no dysuria, pyuria, or hematuria  He also denies diarrhea.     Objective:   Physical Exam  Significant or distinguishing  findings on physical exam are documented first.  Below that are other systems examined & findings.   He has scattered aphthous type ulcers under the tongue and involving the right tonsil and the buccal mucosa.  Small osteoma noted in the right otic canal.  He has a keloid formation related to previous abdominal surgery.  The remainder the exam is totally negative.  General appearance:good health ;well nourished; no acute distress or increased work of breathing is present.  No  lymphadenopathy about the head, neck, or axilla noted.   Eyes: No conjunctival inflammation or lid edema is present. There is no scleral icterus.  Ears:  External ear exam shows no significant lesions or deformities.  Otoscopic examination reveals clear canals, tympanic membranes are intact bilaterally without bulging, retraction, inflammation or discharge.  Nose:  External nasal examination shows no deformity or inflammation. Nasal mucosa are pink and moist without lesions or exudates. No septal dislocation or  deviation.No obstruction to airflow.   Oral exam: Dental hygiene is good; lips and gums are healthy appearing.  Neck:  No deformities, thyromegaly, masses, or tenderness noted.   Supple with full range of motion without pain.   Heart:  Normal rate and regular rhythm. S1 and S2 normal without gallop, murmur, click, rub or other extra sounds.   Lungs:Chest clear to auscultation; no wheezes, rhonchi,rales ,or rubs present.No increased work of breathing.    Abdomen: bowel sounds normal, soft and non-tender without masses, organomegaly or hernias noted.  No guarding or rebound  Extremities:  No cyanosis, edema, or clubbing  noted    Skin: Warm & dry w/o jaundice or tenting.       Assessment & Plan:  #1 Mucositis See orders

## 2014-03-18 NOTE — Progress Notes (Signed)
Pre visit review using our clinic review tool, if applicable. No additional management support is needed unless otherwise documented below in the visit note. 

## 2014-03-18 NOTE — Telephone Encounter (Signed)
After speaking with the lab, it was determined that the culture was thrown out accidentally and never processed.  Senaida Ores (lab manager) handled the follow-up with the lab staff regarding this matter Dr. Linna Darner was [facility] of the mistake.  Due to the patient not having any improvement he has requested that Mr. Etienne be seen back in the office.  Mr. Palmateer was called and notified regarding our inability to process his culture.  An appointment was made for 4:30 today.

## 2014-03-18 NOTE — Telephone Encounter (Signed)
Please call Lab & get results; not in EMR

## 2014-03-18 NOTE — Patient Instructions (Addendum)
NSAIDS ( Aleve, Advil, Naproxen) or Tylenol every 4 hrs as needed for fever as discussed based on label recommendations Magic mouthwash 5 cc (1 teaspoon) gargled well  and swallowed  3 times a day

## 2014-03-18 NOTE — Telephone Encounter (Signed)
Merrily Pew will you please help with this. Thanks

## 2014-03-18 NOTE — Progress Notes (Signed)
   Subjective:    Patient ID: Brandon Sanders, male    DOB: 1956-02-04, 58 y.o.   MRN: [id number]  HPI Pt was seen 03/15/14 for sore throat and rhinorrhea. He was given a Z-pack. Significantly, the pt is currently being treated for RCC with a PO chemotherapeutic agent, Afinitor. The pt's labs from 03/15/14 show the pt is not immunosuppressed. He is slightly anemic.   Today the pt's symptoms have not improved since starting the Z-pack on 03/15/14. He continues to have fevers and myalgias. Despite taking advil ATC he has had a Tmax of 102 in the past two days. He additionally has new onset of multiple mouth ulcers. The ulcers are on his tongue and scattered across the buccal mucosa and floor of his mouth. The lesions are painful and making it difficult for the pt to eat.   The pt has stopped taking his afinitor as of Saturday 03/13/14, out of concern that it may decrease his ability to fight off an infection.   Review of Systems     Objective:   Physical Exam        Assessment & Plan:

## 2014-03-18 NOTE — Telephone Encounter (Signed)
Please help F/U this process with  Josh & me

## 2014-03-18 NOTE — Telephone Encounter (Signed)
Called lab spoke with Lanette she stated the throat culture was not done. Order that was put in on 03/16/14 which it should have been entered on 03/15/14 along with cbc...Johny Chess

## 2014-03-19 ENCOUNTER — Telehealth: Payer: Self-pay

## 2014-03-19 NOTE — Telephone Encounter (Signed)
Message copied by Shelly Coss on Fri Mar 19, 2014  8:15 AM ------      Message from: Hendricks Limes      Created: Thu Mar 18, 2014  6:00 PM       Please FAX office visit & lab results to Dr Bufford Spikes, [facility]. Thanks ------

## 2014-03-19 NOTE — Telephone Encounter (Signed)
03/18/14 office notes and 03/15/14 03/04/14 labs have been faxed to Dr Campbell Riches at [phone]

## 2014-03-22 ENCOUNTER — Telehealth: Payer: Self-pay | Admitting: *Deleted

## 2014-03-22 NOTE — Telephone Encounter (Signed)
Left msg on triage stating he has spoken with his cancer md @ [facility]. He is wanting him to get IV fluid & cxr done. Wanting to see can md get that arrange for him to have done. Called pt back inform him that he will need to go to ER out md can't get that set-up for him...Brandon Sanders

## 2014-05-28 ENCOUNTER — Other Ambulatory Visit: Payer: 59

## 2014-05-28 ENCOUNTER — Telehealth: Payer: Self-pay

## 2014-05-28 NOTE — Telephone Encounter (Signed)
Phone call from [place] at [facility], ext [phone]. She states the culture done on patient back in July has been found in the back of the fridge in the lab. [place] is asking what should be done with it. (see 03/18/14 telephone note)

## 2014-05-28 NOTE — Telephone Encounter (Signed)
Too late to process

## 2014-09-13 ENCOUNTER — Ambulatory Visit (INDEPENDENT_AMBULATORY_CARE_PROVIDER_SITE_OTHER): Payer: 59 | Admitting: Nurse Practitioner

## 2014-09-13 ENCOUNTER — Encounter: Payer: Self-pay | Admitting: Nurse Practitioner

## 2014-09-13 VITALS — BP 138/80 | HR 73 | Temp 98.1°F | Ht 74.0 in | Wt 186.0 lb

## 2014-09-13 DIAGNOSIS — H1013 Acute atopic conjunctivitis, bilateral: Secondary | ICD-10-CM

## 2014-09-13 MED ORDER — OLOPATADINE HCL 0.1 % OP SOLN: 1.0000 [drp] | Freq: Two times a day (BID) | OPHTHALMIC | Status: DC

## 2014-09-13 NOTE — Patient Instructions (Addendum)
Start eye drop. Use twice daily. Continue to remove & cleanse contact lense daily. Start daily sinus rinse-Neilmed sinus Rinse-to decrease allergenic load. Cleanse lids & lashes daily w/1:1 solution of baby shampoo & water. Use Cold packs to quell itching & swelling. You may continue zyrtec daily. Please f/u w/Dr Jenny Reichmann in 2 weeks.  Allergic Conjunctivitis The conjunctiva is a thin membrane that covers the visible white part of the eyeball and the underside of the eyelids. This membrane protects and lubricates the eye. The membrane has small blood vessels running through it that can normally be seen. When the conjunctiva becomes inflamed, the condition is called conjunctivitis. In response to the inflammation, the conjunctival blood vessels become swollen. The swelling results in redness in the normally white part of the eye. The blood vessels of this membrane also react when a person has allergies and is then called allergic conjunctivitis. This condition usually lasts for as long as the allergy persists. Allergic conjunctivitis cannot be passed to another person (non-contagious). The likelihood of bacterial infection is great and the cause is not likely due to allergies if the inflamed eye has:  A sticky discharge.  Discharge or sticking together of the lids in the morning.  Scaling or flaking of the eyelids where the eyelashes come out.  Red swollen eyelids. CAUSES   Viruses.  Irritants such as foreign bodies.  Chemicals.  General allergic reactions.  Inflammation or serious diseases in the inside or the outside of the eye or the orbit (the boney cavity in which the eye sits) can cause a "red eye." SYMPTOMS   Eye redness.  Tearing.  Itchy eyes.  Burning feeling in the eyes.  Clear drainage from the eye.  Allergic reaction due to pollens or ragweed sensitivity. Seasonal allergic conjunctivitis is frequent in the spring when pollens are in the air and in the fall. DIAGNOSIS    This condition, in its many forms, is usually diagnosed based on the history and an ophthalmological exam. It usually involves both eyes. If your eyes react at the same time every year, allergies may be the cause. While most "red eyes" are due to allergy or an infection, the role of an eye (ophthalmological) exam is important. The exam can rule out serious diseases of the eye or orbit. TREATMENT   Non-antibiotic eye drops, ointments, or medications by mouth may be prescribed if the ophthalmologist is sure the conjunctivitis is due to allergies alone.  Over-the-counter drops and ointments for allergic symptoms should be used only after other causes of conjunctivitis have been ruled out, or as your caregiver suggests. Medications by mouth are often prescribed if other allergy-related symptoms are present. If the ophthalmologist is sure that the conjunctivitis is due to allergies alone, treatment is normally limited to drops or ointments to reduce itching and burning. HOME CARE INSTRUCTIONS   Wash hands before and after applying drops or ointments, or touching the inflamed eye(s) or eyelids.  Do not let the eye dropper tip or ointment tube touch the eyelid when putting medicine in your eye.  Stop using your soft contact lenses and throw them away. Use a new pair of lenses when recovery is complete. You should run through sterilizing cycles at least three times before use after complete recovery if the old soft contact lenses are to be used. Hard contact lenses should be stopped. They need to be thoroughly sterilized before use after recovery.  Itching and burning eyes due to allergies is often relieved by using a  cool cloth applied to closed eye(s). SEEK MEDICAL CARE IF:   Your problems do not go away after two or three days of treatment.  Your lids are sticky (especially in the morning when you wake up) or stick together.  Discharge develops. Antibiotics may be needed either as drops, ointment,  or by mouth.  You have extreme light sensitivity.  An oral temperature above 102 F (38.9 C) develops.  Pain in or around the eye or any other visual symptom develops. MAKE SURE YOU:   Understand these instructions.  Will watch your condition.  Will get help right away if you are not doing well or get worse. Document Released: 11/17/2002 Document Revised: 11/19/2011 Document Reviewed: 10/13/2007 [facility] Patient Information 2015 [place], [place]. This information is not intended to replace advice given to you by your health care provider. Make sure you discuss any questions you have with your health care provider.

## 2014-09-13 NOTE — Progress Notes (Signed)
Pre visit review using our clinic review tool, if applicable. No additional management support is needed unless otherwise documented below in the visit note. 

## 2014-09-13 NOTE — Progress Notes (Signed)
   Subjective:    Patient ID: Brandon Sanders, male    DOB: 1955/09/18, 59 y.o.   MRN: [id number]  HPI Comments: Pt receiving treatment for renal cell Ca. Thought treatment was contributing to current symptoms-stopped for 1 week, no relief. Uses L contact lense-takes out every night. Cleans well. Just put in new lense. No improvement.  Eye Problem  Both (L worse than R) eyes are affected.This is a recurrent problem. The current episode started in the past 7 days (symtpms present few mos ago-returned in last few days). The problem occurs constantly. The problem has been waxing and waning. There was no injury mechanism (wears contact lense L eye). The pain is mild. There is no known exposure to pink eye. He wears contacts. Associated symptoms include an eye discharge (crust in am, bilat), eye redness and itching. Pertinent negatives include no blurred vision, double vision, fever, foreign body sensation, nausea, photophobia or recent URI. Treatments tried: OTC antihistamine eye drop. The treatment provided no relief (makes eye more irritated-burns, using zyrtec).      Review of Systems  Constitutional: Negative for fever.  HENT: Positive for rhinorrhea and sneezing.   Eyes: Positive for discharge (crust in am, bilat), redness and itching. Negative for blurred vision, double vision, photophobia, pain and visual disturbance.  Respiratory: Negative for cough.   Gastrointestinal: Negative for nausea.  Skin: Positive for itching.       Objective:   Physical Exam  Constitutional: He is oriented to person, place, and time. He appears well-developed and well-nourished. No distress.  HENT:  Head: Normocephalic and atraumatic.  Right Ear: External ear normal.  Left Ear: External ear normal.  Mouth/Throat: Oropharynx is clear and moist. No oropharyngeal exudate.  Eyes: Pupils are equal, round, and reactive to light. Right eye exhibits no discharge. Left eye exhibits no discharge.  Mildly injected L  conjunctiva. Puffy lids-L >R  Cardiovascular: Normal rate, regular rhythm and normal heart sounds.   No murmur heard. Pulmonary/Chest: Effort normal and breath sounds normal. No respiratory distress.  Neurological: He is alert and oriented to person, place, and time.  Skin: Skin is warm and dry.  Psychiatric: He has a normal mood and affect. His behavior is normal. Thought content normal.  Vitals reviewed.         Assessment & Plan:  1. Allergic conjunctivitis, bilateral DD: bacterial cinj, corneal abrasion - olopatadine (PATANOL) 0.1 % ophthalmic solution; Place 1 drop into both eyes 2 (two) times daily.  Dispense: 5 mL; Refill: 12  F/u 2 weeks

## 2014-09-14 ENCOUNTER — Other Ambulatory Visit: Payer: Self-pay | Admitting: Nurse Practitioner

## 2014-09-14 ENCOUNTER — Telehealth: Payer: Self-pay | Admitting: Internal Medicine

## 2014-09-14 DIAGNOSIS — H1013 Acute atopic conjunctivitis, bilateral: Secondary | ICD-10-CM

## 2014-09-14 MED ORDER — AZELASTINE HCL 0.05 % OP SOLN: 1.0000 [drp] | Freq: Two times a day (BID) | OPHTHALMIC | Status: DC

## 2014-09-14 NOTE — Telephone Encounter (Signed)
Pt saw NP Nicky Pugh on yesterday. pls advise on msg below...Brandon Sanders

## 2014-09-14 NOTE — Telephone Encounter (Signed)
Notified pt with [name] response...Brandon Sanders

## 2014-09-14 NOTE — Telephone Encounter (Signed)
Ok to forward to responsible provider, thanks

## 2014-09-14 NOTE — Telephone Encounter (Signed)
Pt calling back and requesting this be addressed soon, and can something else be called in as the stated medication below is over $200.

## 2014-09-14 NOTE — Telephone Encounter (Signed)
Pt called in said that ins did not cover the   olopatadine (PATANOL) 0.1 % ophthalmic solution [[id number]]   That was called in, he wanted to know if there is anything else that can be called in???

## 2014-09-14 NOTE — Telephone Encounter (Signed)
I sent another eye drop-don't know if it will be cheaper. If he cannot afford it, he may buy zaditor or ketotifen OTC.

## 2014-10-03 DIAGNOSIS — K439 Ventral hernia without obstruction or gangrene: Secondary | ICD-10-CM | POA: Insufficient documentation

## 2014-10-19 ENCOUNTER — Encounter: Payer: Self-pay | Admitting: Internal Medicine

## 2014-12-11 ENCOUNTER — Encounter (HOSPITAL_BASED_OUTPATIENT_CLINIC_OR_DEPARTMENT_OTHER): Payer: Self-pay | Admitting: Emergency Medicine

## 2014-12-11 ENCOUNTER — Emergency Department (HOSPITAL_BASED_OUTPATIENT_CLINIC_OR_DEPARTMENT_OTHER)
Admission: EM | Admit: 2014-12-11 | Discharge: 2014-12-11 | Disposition: A | Discharge status: Home / Self Care | Payer: 59 | Attending: Emergency Medicine | Admitting: Emergency Medicine

## 2014-12-11 DIAGNOSIS — Z7982 Long term (current) use of aspirin: Secondary | ICD-10-CM | POA: Diagnosis not present

## 2014-12-11 DIAGNOSIS — N4833 Priapism, drug-induced: Secondary | ICD-10-CM

## 2014-12-11 DIAGNOSIS — Z880 Allergy status to penicillin: Secondary | ICD-10-CM | POA: Diagnosis not present

## 2014-12-11 DIAGNOSIS — Z8719 Personal history of other diseases of the digestive system: Secondary | ICD-10-CM | POA: Insufficient documentation

## 2014-12-11 DIAGNOSIS — Z79899 Other long term (current) drug therapy: Secondary | ICD-10-CM | POA: Insufficient documentation

## 2014-12-11 DIAGNOSIS — Z85528 Personal history of other malignant neoplasm of kidney: Secondary | ICD-10-CM | POA: Diagnosis not present

## 2014-12-11 DIAGNOSIS — I129 Hypertensive chronic kidney disease with stage 1 through stage 4 chronic kidney disease, or unspecified chronic kidney disease: Secondary | ICD-10-CM | POA: Insufficient documentation

## 2014-12-11 DIAGNOSIS — N189 Chronic kidney disease, unspecified: Secondary | ICD-10-CM | POA: Diagnosis not present

## 2014-12-11 DIAGNOSIS — E785 Hyperlipidemia, unspecified: Secondary | ICD-10-CM | POA: Diagnosis not present

## 2014-12-11 DIAGNOSIS — N4830 Priapism, unspecified: Secondary | ICD-10-CM | POA: Diagnosis present

## 2014-12-11 MED ORDER — PHENYLEPHRINE 200 MCG/ML FOR PRIAPISM / HYPOTENSION
200.0000 ug | INTRAMUSCULAR | Status: DC | PRN
  Administered 2014-12-11: 200 ug via INTRACAVERNOUS
  Filled 2014-12-11: qty 50

## 2014-12-11 NOTE — ED Provider Notes (Addendum)
CSN: [id number]     Arrival date & time 12/11/14  0409 History   First MD Initiated Contact with Patient 12/11/14 [phone]     Chief Complaint  Patient presents with  . Priapism      (Consider location/radiation/quality/duration/timing/severity/associated sxs/prior Treatment) HPI  This is a 59 year old male with a history of erectile dysfunction and renal cell carcinoma. He does not respond to Viagra and similar drugs so he uses Bi-Mix, a combination of papaverine plus phentolamine injected intra-cavernously as needed. He injected his usual dose of the drug in the right corpus cavernosum at midnight after which he had sexual intercourse. He has subsequently not lost his erection. His Park penis is only partly detumesced. He was having moderate pain earlier but this is improving. He has never had this happen before.  Past Medical History  Diagnosis Date  . Hyperlipidemia   . HTN (hypertension)   . Erectile dysfunction     managed with injections; no response with Viagra/Cialis  . Carpal tunnel syndrome, bilateral   . Chronic renal insufficiency   . Solitary kidney, acquired   . Renal cell cancer     currently in clinical trial using Afinitor  . DJD of shoulder   . SYNCOPE 09/05/2010  . COLITIS 12/17/2007  . PLANTAR FASCIITIS 09/05/2010   Past Surgical History  Procedure Laterality Date  . Nephrectomy      Right  . S.p right cts release    . S/p right ulnar nerve release     Family History  Problem Relation Age of Onset  . Stroke Father   . Transient ischemic attack Father   . Hypertension Father 64    CABG  . Liver cancer Maternal Grandmother   . Heart disease Other    History  Substance Use Topics  . Smoking status: Never Smoker   . Smokeless tobacco: Never Used     Comment: quit 25 yrs ago  . Alcohol Use: No    Review of Systems  All other systems reviewed and are negative.   Allergies  Penicillins  Home Medications   Prior to Admission medications    Medication Sig Start Date End Date Taking? Authorizing Provider  amLODipine (NORVASC) 5 MG tablet Take 1 tablet (5 mg total) by mouth daily. 03/04/14   Biagio Borg, MD  aspirin 81 MG EC tablet Take 81 mg by mouth daily.      Historical Provider, MD  azelastine (OPTIVAR) 0.05 % ophthalmic solution Place 1 drop into both eyes 2 (two) times daily. 09/14/14   Irene Pap, NP  azithromycin (ZITHROMAX Z-PAK) 250 MG tablet 2 day 1, then 1 qd Patient not taking: Reported on 09/13/2014 03/15/14   Hendricks Limes, MD  carbamazepine (TEGRETOL) 200 MG tablet Take 1 tablet (200 mg total) by mouth 3 (three) times daily. Patient not taking: Reported on 09/13/2014 07/07/13   Biagio Borg, MD  everolimus (AFINITOR) 10 MG tablet Take 10 mg by mouth daily.    Historical Provider, MD  fenofibrate 160 MG tablet Take 1 tablet (160 mg total) by mouth daily. 03/04/14   Biagio Borg, MD  metoprolol succinate (TOPROL-XL) 50 MG 24 hr tablet Take 1 tablet (50 mg total) by mouth daily. Take with or immediately following a meal. 03/04/14   Biagio Borg, MD  pravastatin (PRAVACHOL) 40 MG tablet Take 1 tablet (40 mg total) by mouth daily. 03/04/14   Biagio Borg, MD   BP 158/85 mmHg  Pulse 73  Temp(Src) 98 F (36.7 C) (Oral)  Resp 16  Ht 6\' 2"  (1.88 m)  Wt 185 lb (83.915 kg)  BMI 23.74 kg/m2  SpO2 100%   Physical Exam  General: Well-developed, well-nourished male in no acute distress; appearance consistent with age of record HENT: normocephalic; atraumatic Eyes: pupils equal, round and reactive to light; extraocular muscles intact Neck: supple Heart: regular rate and rhythm Lungs: clear to auscultation bilaterally Abdomen: soft; nondistended; nontender; bowel sounds present GU: Tanner 5 male, circumcised; erection present Extremities: No deformity; full range of motion; pulses normal Neurologic: Awake, alert and oriented; motor function intact in all extremities and symmetric; no facial droop Skin: Warm and  dry Psychiatric: Normal mood and affect   ED Course  Procedures (including critical care time)  4:41 AM INTRACAVERNOSAL INJECTION 200 micrograms of phenylephrine in 1 milliliter of normal saline was injected into the right corpus cavernosum for treatment of priapism. The patient tolerated this well and there were no immediate complications.  MDM  5:13 AM Successful detumescence after administration of intracavernosal phenylephrine. He was advised to avoid Bi-Mix pending discussion with the prescribing physician.    Shanon Rosser, MD 12/11/14 [place], MD 12/11/14 [phone]

## 2014-12-11 NOTE — ED Notes (Signed)
Patient states that he has had an erection since MID night. Reports that it is getting better.

## 2015-02-14 ENCOUNTER — Other Ambulatory Visit: Payer: Self-pay | Admitting: Internal Medicine

## 2015-03-02 ENCOUNTER — Other Ambulatory Visit (INDEPENDENT_AMBULATORY_CARE_PROVIDER_SITE_OTHER): Payer: 59

## 2015-03-02 DIAGNOSIS — Z0000 Encounter for general adult medical examination without abnormal findings: Secondary | ICD-10-CM | POA: Diagnosis not present

## 2015-03-02 LAB — LIPID PANEL
Cholesterol: 163 mg/dL (ref 0–200)
HDL: 44 mg/dL (ref >39.00)
LDL CALC: 98 mg/dL (ref 0–99)
NonHDL: 119
Total CHOL/HDL Ratio: 4
Triglycerides: 106 mg/dL (ref 0.0–149.0)
VLDL: 21.2 mg/dL (ref 0.0–40.0)

## 2015-03-02 LAB — URINALYSIS, ROUTINE W REFLEX MICROSCOPIC
BILIRUBIN URINE: NEGATIVE
Hgb urine dipstick: NEGATIVE
KETONES UR: NEGATIVE
Leukocytes, UA: NEGATIVE
NITRITE: NEGATIVE
PH: 6.5 (ref 5.0–8.0)
RBC / HPF: NONE SEEN (ref 0–?)
Specific Gravity, Urine: 1.02 (ref 1.000–1.030)
TOTAL PROTEIN, URINE-UPE24: NEGATIVE
URINE GLUCOSE: NEGATIVE
Urobilinogen, UA: 0.2 (ref 0.0–1.0)

## 2015-03-02 LAB — CBC WITH DIFFERENTIAL/PLATELET
Basophils Absolute: 0 10*3/uL (ref 0.0–0.1)
Basophils Relative: 0.7 % (ref 0.0–3.0)
EOS ABS: 0.1 10*3/uL (ref 0.0–0.7)
Eosinophils Relative: 2.3 % (ref 0.0–5.0)
HCT: 42.1 % (ref 39.0–52.0)
HEMOGLOBIN: 14.1 g/dL (ref 13.0–17.0)
LYMPHS ABS: 1.3 10*3/uL (ref 0.7–4.0)
Lymphocytes Relative: 22.1 % (ref 12.0–46.0)
MCHC: 33.4 g/dL (ref 30.0–36.0)
MCV: 80.1 fl (ref 78.0–100.0)
MONO ABS: 0.6 10*3/uL (ref 0.1–1.0)
Monocytes Relative: 10.4 % (ref 3.0–12.0)
NEUTROS PCT: 64.5 % (ref 43.0–77.0)
Neutro Abs: 3.7 10*3/uL (ref 1.4–7.7)
Platelets: 329 10*3/uL (ref 150.0–400.0)
RBC: 5.26 Mil/uL (ref 4.22–5.81)
RDW: 13.8 % (ref 11.5–15.5)
WBC: 5.8 10*3/uL (ref 4.0–10.5)

## 2015-03-02 LAB — BASIC METABOLIC PANEL
BUN: 18 mg/dL (ref 6–23)
CHLORIDE: 102 meq/L (ref 96–112)
CO2: 31 mEq/L (ref 19–32)
Calcium: 9.6 mg/dL (ref 8.4–10.5)
Creatinine, Ser: 1.57 mg/dL — ABNORMAL HIGH (ref 0.40–1.50)
GFR: 48.56 mL/min — ABNORMAL LOW (ref >60.00)
GLUCOSE: 88 mg/dL (ref 70–99)
Potassium: 4.2 mEq/L (ref 3.5–5.1)
Sodium: 139 mEq/L (ref 135–145)

## 2015-03-02 LAB — TSH: TSH: 1.89 u[IU]/mL (ref 0.35–4.50)

## 2015-03-02 LAB — HEPATIC FUNCTION PANEL
ALBUMIN: 4.2 g/dL (ref 3.5–5.2)
ALT: 22 U/L (ref 0–53)
AST: 26 U/L (ref 0–37)
Alkaline Phosphatase: 50 U/L (ref 39–117)
Bilirubin, Direct: 0.1 mg/dL (ref 0.0–0.3)
TOTAL PROTEIN: 6.8 g/dL (ref 6.0–8.3)
Total Bilirubin: 0.4 mg/dL (ref 0.2–1.2)

## 2015-03-02 LAB — PSA: PSA: 1.53 ng/mL (ref 0.10–4.00)

## 2015-03-09 ENCOUNTER — Encounter: Payer: Self-pay | Admitting: Internal Medicine

## 2015-03-09 ENCOUNTER — Ambulatory Visit (INDEPENDENT_AMBULATORY_CARE_PROVIDER_SITE_OTHER): Payer: 59 | Admitting: Internal Medicine

## 2015-03-09 VITALS — BP 122/80 | HR 64 | Temp 97.6°F | Ht 74.0 in | Wt 180.0 lb

## 2015-03-09 DIAGNOSIS — I493 Ventricular premature depolarization: Secondary | ICD-10-CM | POA: Diagnosis not present

## 2015-03-09 DIAGNOSIS — Z0000 Encounter for general adult medical examination without abnormal findings: Secondary | ICD-10-CM | POA: Diagnosis not present

## 2015-03-09 MED ORDER — METOPROLOL SUCCINATE ER 50 MG PO TB24: ORAL_TABLET | ORAL | Status: DC

## 2015-03-09 NOTE — Assessment & Plan Note (Signed)
With some increased symtpoms, ok for increased toprol XL to 75 qd

## 2015-03-09 NOTE — Patient Instructions (Signed)
OK to increase the toprol XL to 75 ng per day  Please continue all other medications as before, and refills have been done if requested.  Please have the pharmacy call with any other refills you may need.  Please continue your efforts at being more active, low cholesterol diet, and weight control.  You are otherwise up to date with prevention measures today.  Please keep your appointments with your specialists as you may have planned  Please return in 1 year for your yearly visit, or sooner if needed, with Lab testing done 3-5 days before

## 2015-03-09 NOTE — Assessment & Plan Note (Signed)

## 2015-03-09 NOTE — Progress Notes (Signed)
Subjective:    Patient ID: Brandon Sanders, male    DOB: 04-08-1956, 59 y.o.   MRN: [id number]  HPI  Here for wellness and f/u;  Overall doing ok;  Pt denies Chest pain, worsening SOB, DOE, wheezing, orthopnea, PND, worsening LE edema, palpitations, dizziness or syncope.  Pt denies neurological change such as new headache, facial or extremity weakness.  Pt denies polydipsia, polyuria, or low sugar symptoms. Pt states overall good compliance with treatment and medications, good tolerability, and has been trying to follow appropriate diet.  Pt denies worsening depressive symptoms, suicidal ideation or panic. No fever, night sweats, wt loss, loss of appetite, or other constitutional symptoms.  Pt states good ability with ADL's, has low fall risk, home safety reviewed and adequate, no other significant changes in hearing or vision, and occasionally active with exercise.  He is happily surprised at labs today, baseline cr at [facility] has been about 1.6-1.7, now 1.5 here.  Has had somewhat increased freq of palpitatiaon, has been on 50 mg metoprolol.   Wt Readings from Last 3 Encounters:  03/09/15 180 lb (81.647 kg)  12/11/14 185 lb (83.915 kg)  09/13/14 186 lb (84.369 kg)  had borderline pna last with with lower appetite, and some wt loss he never regained.  On Affinitor daily.  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  Gets regular CT f/u at [facility] q 4 mo to follow 2 abd nodes slighlty enlarged, on recent change.  Past Medical History  Diagnosis Date  . Hyperlipidemia   . HTN (hypertension)   . Erectile dysfunction     managed with injections; no response with Viagra/Cialis  . Carpal tunnel syndrome, bilateral   . Chronic renal insufficiency   . Solitary kidney, acquired   . Renal cell cancer     currently in clinical trial using Afinitor  . DJD of shoulder   . SYNCOPE 09/05/2010  . COLITIS 12/17/2007  . PLANTAR FASCIITIS 09/05/2010   Past  Surgical History  Procedure Laterality Date  . Nephrectomy      Right  . S.p right cts release    . S/p right ulnar nerve release      reports that he has never smoked. He has never used smokeless tobacco. He reports that he does not drink alcohol or use illicit drugs. family history includes Heart disease in his other; Hypertension (age of onset: 35) in his father; Liver cancer in his maternal grandmother; Stroke in his father; Transient ischemic attack in his father. Allergies  Allergen Reactions  . Penicillins    Current Outpatient Prescriptions on File Prior to Visit  Medication Sig Dispense Refill  . amLODipine (NORVASC) 5 MG tablet Take 1 tablet by mouth  daily 90 tablet 0  . aspirin 81 MG EC tablet Take 81 mg by mouth daily.      Marland Kitchen azelastine (OPTIVAR) 0.05 % ophthalmic solution Place 1 drop into both eyes 2 (two) times daily. 6 mL 12  . everolimus (AFINITOR) 10 MG tablet Take 10 mg by mouth daily.    . fenofibrate 160 MG tablet Take 1 tablet by mouth  daily 90 tablet 0  . metoprolol succinate (TOPROL-XL) 50 MG 24 hr tablet Take 1 tablet (50 mg total) by mouth daily - with or  immediately following a  meal. 90 tablet 0  . pravastatin (PRAVACHOL) 40 MG tablet Take 1 tablet by mouth  daily 90 tablet 0  .  azithromycin (ZITHROMAX Z-PAK) 250 MG tablet 2 day 1, then 1 qd (Patient not taking: Reported on 09/13/2014) 6 tablet 0  . carbamazepine (TEGRETOL) 200 MG tablet Take 1 tablet (200 mg total) by mouth 3 (three) times daily. (Patient not taking: Reported on 09/13/2014) 60 tablet 5   No current facility-administered medications on file prior to visit.   Review of Systems Constitutional: Negative for increased diaphoresis, other activity, appetite or siginficant weight change other than noted HENT: Negative for worsening hearing loss, ear pain, facial swelling, mouth sores and neck stiffness.   Eyes: Negative for other worsening pain, redness or visual disturbance.  Respiratory: Negative  for shortness of breath and wheezing  Cardiovascular: Negative for chest pain and palpitations.  Gastrointestinal: Negative for diarrhea, blood in stool, abdominal distention or other pain Genitourinary: Negative for hematuria, flank pain or change in urine volume.  Musculoskeletal: Negative for myalgias or other joint complaints.  Skin: Negative for color change and wound or drainage.  Neurological: Negative for syncope and numbness. other than noted Hematological: Negative for adenopathy. or other swelling Psychiatric/Behavioral: Negative for hallucinations, SI, self-injury, decreased concentration or other worsening agitation.      Objective:   Physical Exam BP 122/80 mmHg  Pulse 64  Temp(Src) 97.6 F (36.4 C) (Oral)  Ht '6\' 2"'  (1.88 m)  Wt 180 lb (81.647 kg)  BMI 23.10 kg/m2  SpO2 99% VS noted,  Constitutional: Pt is oriented to person, place, and time. Appears well-developed and well-nourished, in no significant distress Head: Normocephalic and atraumatic.  Right Ear: External ear normal.  Left Ear: External ear normal.  Nose: Nose normal.  Mouth/Throat: Oropharynx is clear and moist.  Eyes: Conjunctivae and EOM are normal. Pupils are equal, round, and reactive to light.  Neck: Normal range of motion. Neck supple. No JVD present. No tracheal deviation present or significant neck LA or mass Cardiovascular: Normal rate, regular rhythm, normal heart sounds and intact distal pulses.   Pulmonary/Chest: Effort normal and breath sounds without rales or wheezing  Abdominal: Soft. Bowel sounds are normal. NT. No HSM  Musculoskeletal: Normal range of motion. Exhibits no edema.  Lymphadenopathy:  Has no cervical adenopathy.  Neurological: Pt is alert and oriented to person, place, and time. Pt has normal reflexes. No cranial nerve deficit. Motor grossly intact Skin: Skin is warm and dry. No rash noted.  Psychiatric:  Has normal mood and affect. Behavior is normal.     Assessment &  Plan:

## 2015-03-09 NOTE — Progress Notes (Signed)
Pre visit review using our clinic review tool, if applicable. No additional management support is needed unless otherwise documented below in the visit note. 

## 2015-05-15 ENCOUNTER — Other Ambulatory Visit: Payer: Self-pay | Admitting: Internal Medicine

## 2015-10-24 ENCOUNTER — Ambulatory Visit (INDEPENDENT_AMBULATORY_CARE_PROVIDER_SITE_OTHER): Payer: 59 | Admitting: Internal Medicine

## 2015-10-24 VITALS — BP 130/70 | HR 77 | Temp 97.9°F | Ht 74.0 in | Wt 181.2 lb

## 2015-10-24 DIAGNOSIS — R059 Cough, unspecified: Secondary | ICD-10-CM

## 2015-10-24 DIAGNOSIS — E7800 Pure hypercholesterolemia, unspecified: Secondary | ICD-10-CM

## 2015-10-24 DIAGNOSIS — I10 Essential (primary) hypertension: Secondary | ICD-10-CM

## 2015-10-24 DIAGNOSIS — R252 Cramp and spasm: Secondary | ICD-10-CM

## 2015-10-24 DIAGNOSIS — R05 Cough: Secondary | ICD-10-CM

## 2015-10-24 MED ORDER — LEVOFLOXACIN 250 MG PO TABS: 250.0000 mg | ORAL_TABLET | Freq: Every day | ORAL | Status: DC

## 2015-10-24 NOTE — Assessment & Plan Note (Signed)
stable overall by history and exam, recent data reviewed with pt, and pt to continue medical treatment as before,  to f/u any worsening symptoms or concerns BP Readings from Last 3 Encounters:  10/24/15 130/70  03/09/15 122/80  12/11/14 158/85

## 2015-10-24 NOTE — Progress Notes (Signed)
Subjective:    Patient ID: Brandon Sanders, male    DOB: 1956/01/19, 60 y.o.   MRN: [id number]  HPI    Here with 2-3 days acute onset fever, facial pain, pressure, headache, general weakness and malaise, and greenish d/c, with mild ST and cough, but pt denies chest pain, wheezing, increased sob or doe, orthopnea, PND, increased LE swelling, palpitations, dizziness or syncope. Also here with 3 wks onset Left > right leg pain and muscle cramping right > left LE's but No back pain, red, swelling, tender or other LE symptoms.  Not worse with ambulation.  Stopped the fenofibrate and statin last 2 days, has taken statin for 8 yrs.  Has stopped the afinitor for now due to current infectious illness Past Medical History  Diagnosis Date  . Hyperlipidemia   . HTN (hypertension)   . Erectile dysfunction     managed with injections; no response with Viagra/Cialis  . Carpal tunnel syndrome, bilateral   . Chronic renal insufficiency   . Solitary kidney, acquired   . Renal cell cancer     currently in clinical trial using Afinitor  . DJD of shoulder   . SYNCOPE 09/05/2010  . COLITIS 12/17/2007  . PLANTAR FASCIITIS 09/05/2010   Past Surgical History  Procedure Laterality Date  . Nephrectomy      Right  . S.p right cts release    . S/p right ulnar nerve release      reports that he has never smoked. He has never used smokeless tobacco. He reports that he does not drink alcohol or use illicit drugs. family history includes Heart disease in his other; Hypertension (age of onset: 7) in his father; Liver cancer in his maternal grandmother; Stroke in his father; Transient ischemic attack in his father. Allergies  Allergen Reactions  . Penicillins    Current Outpatient Prescriptions on File Prior to Visit  Medication Sig Dispense Refill  . amLODipine (NORVASC) 5 MG tablet Take 1 tablet by mouth  daily 90 tablet 1  . aspirin 81 MG EC tablet Take 81 mg by mouth daily.      . fenofibrate 160 MG tablet  Take 1 tablet by mouth  daily 90 tablet 1  . metoprolol succinate (TOPROL-XL) 50 MG 24 hr tablet Take 1 and 1/2 tablet (total 75 mg total) by mouth daily - with or  immediately following a  meal. 135 tablet 3  . pravastatin (PRAVACHOL) 40 MG tablet Take 1 tablet by mouth  daily 90 tablet 1  . azelastine (OPTIVAR) 0.05 % ophthalmic solution Place 1 drop into both eyes 2 (two) times daily. (Patient not taking: Reported on 10/24/2015) 6 mL 12   No current facility-administered medications on file prior to visit.   Review of Systems  Constitutional: Negative for unusual diaphoresis or night sweats HENT: Negative for ringing in ear or discharge Eyes: Negative for double vision or worsening visual disturbance.  Respiratory: Negative for choking and stridor.   Gastrointestinal: Negative for vomiting or other signifcant bowel change Genitourinary: Negative for hematuria or change in urine volume.  Musculoskeletal: Negative for other MSK pain or swelling Skin: Negative for color change and worsening wound.  Neurological: Negative for tremors and numbness other than noted  Psychiatric/Behavioral: Negative for decreased concentration or agitation other than above       Objective:   Physical Exam BP 130/70 mmHg  Pulse 77  Temp(Src) 97.9 F (36.6 C) (Oral)  Ht 6\' 2"  (1.88 m)  Wt 181 lb  4 oz (82.214 kg)  BMI 23.26 kg/m2  SpO2 98% VS noted, mild ill Constitutional: Pt appears in no significant distress HENT: Head: NCAT.  Right Ear: External ear normal.  Left Ear: External ear normal.  Eyes: . Pupils are equal, round, and reactive to light. Conjunctivae and EOM are normal Bilat tm's with mild erythema.  Max sinus areas non tender.  Pharynx with mild erythema, no exudate Neck: Normal range of motion. Neck supple.  Cardiovascular: Normal rate and regular rhythm.   Pulmonary/Chest: Effort normal and breath sounds without rales or wheezing.  Abd:  Soft, NT, ND, + BS Neurological: Pt is alert. Not  confused , motor grossly intact Skin: Skin is warm. No rash, no LE edema Psychiatric: Pt behavior is normal. No agitation.     Assessment & Plan:

## 2015-10-24 NOTE — Assessment & Plan Note (Signed)
Etiology unclear, recent labs per [facility] oncology with normal ca, mg, K, renal fxn, may be related to current illness/lower volume,  to f/u any worsening symptoms or concerns

## 2015-10-24 NOTE — Progress Notes (Signed)
Pre visit review using our clinic review tool, if applicable. No additional management support is needed unless otherwise documented below in the visit note. 

## 2015-10-24 NOTE — Assessment & Plan Note (Signed)
Ok for 1 mo holiday pravastatin, then restart, and I suspect he may do well with restart

## 2015-10-24 NOTE — Patient Instructions (Signed)
Please take all new medication as prescribed - the antibiotic  You can also take Delsym OTC for cough, and/or Mucinex (or it's generic off brand) for congestion, and tylenol as needed for pain.  OK to hold on the Pravastatin for 1 month, then re-start  Please continue all other medications as before, and refills have been done if requested.  Please have the pharmacy call with any other refills you may need.  Please keep your appointments with your specialists as you may have planned

## 2015-10-24 NOTE — Assessment & Plan Note (Signed)
Mild to mod, c/w bornchitis vs pna, declines cxr,  For antibx course,   to f/u any worsening symptoms or concerns

## 2015-11-03 ENCOUNTER — Other Ambulatory Visit: Payer: Self-pay | Admitting: Internal Medicine

## 2015-12-06 ENCOUNTER — Other Ambulatory Visit: Payer: Self-pay | Admitting: *Deleted

## 2015-12-06 DIAGNOSIS — H1013 Acute atopic conjunctivitis, bilateral: Secondary | ICD-10-CM

## 2015-12-06 MED ORDER — AZELASTINE HCL 0.05 % OP SOLN: 1.0000 [drp] | Freq: Two times a day (BID) | OPHTHALMIC | Status: DC

## 2015-12-06 NOTE — Telephone Encounter (Signed)
Requesting refills on eye drops for his allergies "Opitvar" needing to go to [facility].../lmb

## 2015-12-16 ENCOUNTER — Ambulatory Visit (INDEPENDENT_AMBULATORY_CARE_PROVIDER_SITE_OTHER): Payer: 59 | Admitting: Nurse Practitioner

## 2015-12-16 ENCOUNTER — Encounter: Payer: Self-pay | Admitting: Nurse Practitioner

## 2015-12-16 ENCOUNTER — Other Ambulatory Visit (INDEPENDENT_AMBULATORY_CARE_PROVIDER_SITE_OTHER): Payer: 59

## 2015-12-16 VITALS — BP 150/80 | HR 88 | Temp 98.8°F | Ht 74.0 in | Wt 183.0 lb

## 2015-12-16 DIAGNOSIS — R300 Dysuria: Secondary | ICD-10-CM

## 2015-12-16 DIAGNOSIS — N289 Disorder of kidney and ureter, unspecified: Secondary | ICD-10-CM | POA: Diagnosis not present

## 2015-12-16 DIAGNOSIS — R071 Chest pain on breathing: Secondary | ICD-10-CM | POA: Diagnosis not present

## 2015-12-16 LAB — POCT URINALYSIS DIPSTICK
BILIRUBIN UA: NEGATIVE
Blood, UA: POSITIVE
GLUCOSE UA: NEGATIVE
Ketones, UA: NEGATIVE
LEUKOCYTES UA: NEGATIVE
NITRITE UA: NEGATIVE
Protein, UA: NEGATIVE
Spec Grav, UA: 1.025
UROBILINOGEN UA: NEGATIVE
pH, UA: 6

## 2015-12-16 LAB — COMPREHENSIVE METABOLIC PANEL
ALBUMIN: 4.2 g/dL (ref 3.5–5.2)
ALT: 20 U/L (ref 0–53)
AST: 22 U/L (ref 0–37)
Alkaline Phosphatase: 56 U/L (ref 39–117)
BILIRUBIN TOTAL: 0.6 mg/dL (ref 0.2–1.2)
BUN: 20 mg/dL (ref 6–23)
CALCIUM: 9.6 mg/dL (ref 8.4–10.5)
CHLORIDE: 101 meq/L (ref 96–112)
CO2: 31 mEq/L (ref 19–32)
CREATININE: 1.39 mg/dL (ref 0.40–1.50)
GFR: 55.73 mL/min — ABNORMAL LOW (ref >60.00)
Glucose, Bld: 115 mg/dL — ABNORMAL HIGH (ref 70–99)
Potassium: 3.8 mEq/L (ref 3.5–5.1)
SODIUM: 138 meq/L (ref 135–145)
TOTAL PROTEIN: 7.3 g/dL (ref 6.0–8.3)

## 2015-12-16 NOTE — Progress Notes (Signed)
Patient ID: Brandon Sanders, male    DOB: 1956/08/21  Age: 60 y.o. MRN: [id number]  CC: Back/Kidney   HPI [name] presents for CC of kidney/back pain.   1) Back pain that radiates to left side of chest, pain with deep breathing, pressure in penis with urination for 1 week every time with urination only.  2 days ago started having the left chest pain, pressure in the left hand. Last EKG in 2016 summer  Wants labs checked today  Recently finished Levaquin in Feb for cough  Pt reports "just doesn't feel well"  Denies sick contacts nor treatment to date  History Gergory has a past medical history of Hyperlipidemia; HTN (hypertension); Erectile dysfunction; Carpal tunnel syndrome, bilateral; Chronic renal insufficiency; Solitary kidney, acquired; Renal cell cancer ([place]); DJD of shoulder; SYNCOPE (09/05/2010); COLITIS (12/17/2007); and PLANTAR FASCIITIS (09/05/2010).   He has past surgical history that includes Nephrectomy; s.p right CTS release; and s/p right ulnar nerve release.   His family history includes Heart disease in his other; Hypertension (age of onset: 74) in his father; Liver cancer in his maternal grandmother; Stroke in his father; Transient ischemic attack in his father.He reports that he has never smoked. He has never used smokeless tobacco. He reports that he does not drink alcohol or use illicit drugs.  Outpatient Prescriptions Prior to Visit  Medication Sig Dispense Refill  . amLODipine (NORVASC) 5 MG tablet Take 1 tablet by mouth  daily 90 tablet 3  . aspirin 81 MG EC tablet Take 81 mg by mouth daily.      Marland Kitchen azelastine (OPTIVAR) 0.05 % ophthalmic solution Place 1 drop into both eyes 2 (two) times daily. 6 mL 5  . everolimus (AFINITOR) 5 MG tablet Take 5 mg by mouth daily.    . fenofibrate 160 MG tablet Take 1 tablet by mouth  daily 90 tablet 3  . metoprolol succinate (TOPROL-XL) 50 MG 24 hr tablet Take 1 and 1/2 tablet (total 75 mg total) by mouth daily - with or   immediately following a  meal. 135 tablet 3  . pravastatin (PRAVACHOL) 40 MG tablet Take 1 tablet by mouth  daily 90 tablet 3  . levofloxacin (LEVAQUIN) 250 MG tablet Take 1 tablet (250 mg total) by mouth daily. 10 tablet 0   No facility-administered medications prior to visit.    ROS Review of Systems  Constitutional: Negative for fever, chills, diaphoresis and fatigue.  HENT: Negative for congestion, ear pain, postnasal drip, rhinorrhea, sinus pressure, sneezing and sore throat.   Eyes: Positive for discharge, redness and itching. Negative for visual disturbance.  Respiratory: Negative for cough, chest tightness and wheezing.   Gastrointestinal: Positive for abdominal pain. Negative for nausea, vomiting and diarrhea.  Genitourinary: Positive for flank pain. Negative for dysuria, urgency, frequency, hematuria, decreased urine volume, penile swelling, scrotal swelling, difficulty urinating and testicular pain.    Objective:  BP 150/80 mmHg  Pulse 88  Temp(Src) 98.8 F (37.1 C) (Oral)  Ht '6\' 2"'  (1.88 m)  Wt 183 lb (83.008 kg)  BMI 23.49 kg/m2  SpO2 98%  Physical Exam  Constitutional: He is oriented to person, place, and time. He appears well-developed and well-nourished. No distress.  HENT:  Head: Normocephalic and atraumatic.  Right Ear: External ear normal.  Left Ear: External ear normal.  Eyes: Right eye exhibits no discharge. Left eye exhibits no discharge. No scleral icterus.  Cardiovascular: Normal rate, regular rhythm and normal heart sounds.  Exam reveals no gallop  and no friction rub.   No murmur heard. Pulmonary/Chest: Effort normal and breath sounds normal. No respiratory distress. He has no wheezes. He has no rales. He exhibits no tenderness.  Abdominal: Soft. Bowel sounds are normal. He exhibits no distension and no mass. There is no tenderness. There is no rebound and no guarding.  No CVA tenderness  Genitourinary:  Declined to have exam today of genitalia    Neurological: He is alert and oriented to person, place, and time.  Skin: Skin is warm and dry. No rash noted. He is not diaphoretic.  Psychiatric: He has a normal mood and affect. His behavior is normal. Judgment and thought content normal.   Assessment & Plan:   Ioane was seen today for back/kidney.  Diagnoses and all orders for this visit:  Dysuria -     POCT urinalysis dipstick  Renal insufficiency -     Comp Met (CMET); Future  Chest pain on breathing -     EKG 12-Lead   I have discontinued Mr. Cerami levofloxacin. I am also having him maintain his aspirin, metoprolol succinate, everolimus, pravastatin, fenofibrate, amLODipine, and azelastine.  No orders of the defined types were placed in this encounter.     Follow-up: Return if symptoms worsen or fail to improve.

## 2015-12-16 NOTE — Progress Notes (Signed)
Pre visit review using our clinic review tool, if applicable. No additional management support is needed unless otherwise documented below in the visit note. 

## 2015-12-16 NOTE — Patient Instructions (Signed)
Please visit the lab today.    

## 2015-12-20 DIAGNOSIS — R071 Chest pain on breathing: Secondary | ICD-10-CM | POA: Insufficient documentation

## 2015-12-20 NOTE — Assessment & Plan Note (Signed)
Do not feel he needs chest x-ray today EKG was without significant nor acute concerns- has PVCs today, asymptomatic.  Feel this is MSK in nature FU with provider

## 2015-12-20 NOTE — Assessment & Plan Note (Signed)
Checking Cmet today

## 2016-02-23 ENCOUNTER — Other Ambulatory Visit: Payer: Self-pay | Admitting: Internal Medicine

## 2016-03-19 ENCOUNTER — Telehealth: Payer: Self-pay

## 2016-03-19 DIAGNOSIS — Z0000 Encounter for general adult medical examination without abnormal findings: Secondary | ICD-10-CM

## 2016-03-19 DIAGNOSIS — Z125 Encounter for screening for malignant neoplasm of prostate: Secondary | ICD-10-CM

## 2016-03-19 NOTE — Telephone Encounter (Signed)
Patient is requesting that his labs be put in a week before his cpe on Aug 3.

## 2016-03-20 ENCOUNTER — Telehealth: Payer: Self-pay

## 2016-03-20 DIAGNOSIS — Z0000 Encounter for general adult medical examination without abnormal findings: Secondary | ICD-10-CM

## 2016-03-20 NOTE — Telephone Encounter (Signed)
Lab orders placed.  

## 2016-03-20 NOTE — Telephone Encounter (Signed)
CPX lab orders has been entered...Brandon Sanders

## 2016-04-05 ENCOUNTER — Other Ambulatory Visit (INDEPENDENT_AMBULATORY_CARE_PROVIDER_SITE_OTHER): Payer: 59

## 2016-04-05 DIAGNOSIS — Z0000 Encounter for general adult medical examination without abnormal findings: Secondary | ICD-10-CM

## 2016-04-05 LAB — TSH: TSH: 2.33 u[IU]/mL (ref 0.35–4.50)

## 2016-04-05 LAB — HEPATIC FUNCTION PANEL
ALK PHOS: 56 U/L (ref 39–117)
ALT: 18 U/L (ref 0–53)
AST: 22 U/L (ref 0–37)
Albumin: 4.3 g/dL (ref 3.5–5.2)
BILIRUBIN DIRECT: 0.1 mg/dL (ref 0.0–0.3)
TOTAL PROTEIN: 6.9 g/dL (ref 6.0–8.3)
Total Bilirubin: 0.5 mg/dL (ref 0.2–1.2)

## 2016-04-05 LAB — CBC WITH DIFFERENTIAL/PLATELET
BASOS ABS: 0 10*3/uL (ref 0.0–0.1)
Basophils Relative: 0.6 % (ref 0.0–3.0)
EOS ABS: 0.1 10*3/uL (ref 0.0–0.7)
Eosinophils Relative: 2.2 % (ref 0.0–5.0)
HEMATOCRIT: 40.7 % (ref 39.0–52.0)
HEMOGLOBIN: 13.8 g/dL (ref 13.0–17.0)
LYMPHS PCT: 27.5 % (ref 12.0–46.0)
Lymphs Abs: 1.4 10*3/uL (ref 0.7–4.0)
MCHC: 34 g/dL (ref 30.0–36.0)
MCV: 78.4 fl (ref 78.0–100.0)
Monocytes Absolute: 0.6 10*3/uL (ref 0.1–1.0)
Monocytes Relative: 10.8 % (ref 3.0–12.0)
Neutro Abs: 3 10*3/uL (ref 1.4–7.7)
Neutrophils Relative %: 58.9 % (ref 43.0–77.0)
Platelets: 300 10*3/uL (ref 150.0–400.0)
RBC: 5.19 Mil/uL (ref 4.22–5.81)
RDW: 13.7 % (ref 11.5–15.5)
WBC: 5.2 10*3/uL (ref 4.0–10.5)

## 2016-04-05 LAB — LIPID PANEL
CHOLESTEROL: 173 mg/dL (ref 0–200)
HDL: 44 mg/dL (ref >39.00)
LDL CALC: 100 mg/dL — AB (ref 0–99)
NONHDL: 129.14
Total CHOL/HDL Ratio: 4
Triglycerides: 146 mg/dL (ref 0.0–149.0)
VLDL: 29.2 mg/dL (ref 0.0–40.0)

## 2016-04-05 LAB — BASIC METABOLIC PANEL
BUN: 20 mg/dL (ref 6–23)
CHLORIDE: 103 meq/L (ref 96–112)
CO2: 30 mEq/L (ref 19–32)
CREATININE: 1.33 mg/dL (ref 0.40–1.50)
Calcium: 9.7 mg/dL (ref 8.4–10.5)
GFR: 58.58 mL/min — AB (ref >60.00)
Glucose, Bld: 98 mg/dL (ref 70–99)
Potassium: 4.2 mEq/L (ref 3.5–5.1)
Sodium: 138 mEq/L (ref 135–145)

## 2016-04-05 LAB — HEMOGLOBIN A1C: HEMOGLOBIN A1C: 5.8 % (ref 4.6–6.5)

## 2016-04-05 LAB — MICROALBUMIN / CREATININE URINE RATIO
Creatinine,U: 164 mg/dL
MICROALB UR: 1.1 mg/dL (ref 0.0–1.9)
Microalb Creat Ratio: 0.7 mg/g (ref 0.0–30.0)

## 2016-04-05 LAB — PSA: PSA: 1.73 ng/mL (ref 0.10–4.00)

## 2016-04-12 ENCOUNTER — Ambulatory Visit (INDEPENDENT_AMBULATORY_CARE_PROVIDER_SITE_OTHER): Payer: 59 | Admitting: Internal Medicine

## 2016-04-12 ENCOUNTER — Encounter: Payer: Self-pay | Admitting: Internal Medicine

## 2016-04-12 VITALS — BP 116/78 | HR 62 | Temp 98.1°F | Wt 180.0 lb

## 2016-04-12 DIAGNOSIS — H9192 Unspecified hearing loss, left ear: Secondary | ICD-10-CM | POA: Diagnosis not present

## 2016-04-12 DIAGNOSIS — N289 Disorder of kidney and ureter, unspecified: Secondary | ICD-10-CM | POA: Diagnosis not present

## 2016-04-12 DIAGNOSIS — Z0001 Encounter for general adult medical examination with abnormal findings: Secondary | ICD-10-CM | POA: Diagnosis not present

## 2016-04-12 DIAGNOSIS — E7800 Pure hypercholesterolemia, unspecified: Secondary | ICD-10-CM

## 2016-04-12 DIAGNOSIS — Z1159 Encounter for screening for other viral diseases: Secondary | ICD-10-CM | POA: Diagnosis not present

## 2016-04-12 DIAGNOSIS — I10 Essential (primary) hypertension: Secondary | ICD-10-CM | POA: Diagnosis not present

## 2016-04-12 DIAGNOSIS — K439 Ventral hernia without obstruction or gangrene: Secondary | ICD-10-CM | POA: Insufficient documentation

## 2016-04-12 DIAGNOSIS — R739 Hyperglycemia, unspecified: Secondary | ICD-10-CM | POA: Insufficient documentation

## 2016-04-12 MED ORDER — METOPROLOL SUCCINATE ER 50 MG PO TB24: ORAL_TABLET | ORAL | 3 refills | Status: DC

## 2016-04-12 MED ORDER — FENOFIBRATE 160 MG PO TABS: 160.0000 mg | ORAL_TABLET | Freq: Every day | ORAL | 3 refills | Status: DC

## 2016-04-12 MED ORDER — AMLODIPINE BESYLATE 5 MG PO TABS: 5.0000 mg | ORAL_TABLET | Freq: Every day | ORAL | 3 refills | Status: DC

## 2016-04-12 MED ORDER — PRAVASTATIN SODIUM 40 MG PO TABS: 40.0000 mg | ORAL_TABLET | Freq: Every day | ORAL | 3 refills | Status: DC

## 2016-04-12 NOTE — Progress Notes (Signed)
Subjective:    Patient ID: Brandon Sanders, male    DOB: 02-14-1956, 60 y.o.   MRN: [id number]  HPI Here for wellness and f/u;  Overall doing ok;  Pt denies Chest pain, worsening SOB, DOE, wheezing, orthopnea, PND, worsening LE edema, palpitations, dizziness or syncope. Palpitations overall much improved after increased BB in Feb 2017.   Pt denies neurological change such as new headache, facial or extremity weakness.  Pt denies polydipsia, polyuria, or low sugar symptoms. Pt states overall good compliance with treatment and medications, good tolerability, and has been trying to follow appropriate diet.  Pt denies worsening depressive symptoms, suicidal ideation or panic. No fever, night sweats, wt loss, loss of appetite, or other constitutional symptoms.  Pt states good ability with ADL's, has low fall risk, home safety reviewed and adequate, no other significant changes in hearing or vision, except for some reduced left hearing loss in the past wk, ? Wax impaction.  Has been somewhat more sedentary this past yr but wt overall stable Wt Readings from Last 3 Encounters:  04/12/16 180 lb (81.6 kg)  12/16/15 183 lb (83 kg)  10/24/15 181 lb 4 oz (82.2 kg)  Followed at [facility] oncology with afinitor, getting CT every 6 mo, sees [place] [facility] with BPH. Does have ongoing large lower mid abd post surgical site hernia but no pain and no change in size Past Medical History:  Diagnosis Date  . Carpal tunnel syndrome, bilateral   . Chronic renal insufficiency   . COLITIS 12/17/2007  . DJD of shoulder   . Erectile dysfunction    managed with injections; no response with Viagra/Cialis  . HTN (hypertension)   . Hyperlipidemia   . PLANTAR FASCIITIS 09/05/2010  . Renal cell cancer ([place])    currently in clinical trial using Afinitor  . Solitary kidney, acquired   . SYNCOPE 09/05/2010   Past Surgical History:  Procedure Laterality Date  . NEPHRECTOMY     Right  . s.p right CTS release    . s/p right ulnar  nerve release      reports that he has never smoked. He has never used smokeless tobacco. He reports that he does not drink alcohol or use drugs. family history includes Heart disease in his other; Hypertension (age of onset: 24) in his father; Liver cancer in his maternal grandmother; Stroke in his father; Transient ischemic attack in his father. Allergies  Allergen Reactions  . Penicillins    Current Outpatient Prescriptions on File Prior to Visit  Medication Sig Dispense Refill  . aspirin 81 MG EC tablet Take 81 mg by mouth daily.      Marland Kitchen azelastine (OPTIVAR) 0.05 % ophthalmic solution Place 1 drop into both eyes 2 (two) times daily. 6 mL 5  . everolimus (AFINITOR) 5 MG tablet Take 5 mg by mouth daily.     No current facility-administered medications on file prior to visit.     Review of Systems Constitutional: Negative for increased diaphoresis, or other activity, appetite or siginficant weight change other than noted HENT: Negative for worsening hearing loss, ear pain, facial swelling, mouth sores and neck stiffness.   Eyes: Negative for other worsening pain, redness or visual disturbance.  Respiratory: Negative for choking or stridor Cardiovascular: Negative for other chest pain and palpitations.  Gastrointestinal: Negative for worsening diarrhea, blood in stool, or abdominal distention Genitourinary: Negative for hematuria, flank pain or change in urine volume.  Musculoskeletal: Negative for myalgias or other joint complaints.  Skin:  Negative for other color change and wound or drainage.  Neurological: Negative for syncope and numbness. other than noted Hematological: Negative for adenopathy. or other swelling Psychiatric/Behavioral: Negative for hallucinations, SI, self-injury, decreased concentration or other worsening agitation.      Objective:   Physical Exam BP 116/78   Pulse 62   Temp 98.1 F (36.7 C)   Wt 180 lb (81.6 kg)   SpO2 99%   BMI 23.11 kg/m  VS noted,    Constitutional: Pt is oriented to person, place, and time. Appears well-developed and well-nourished, in no significant distress Head: Normocephalic and atraumatic  Eyes: Conjunctivae and EOM are normal. Pupils are equal, round, and reactive to light Right Ear: External ear normal.  Left Ear: External ear normal Nose: Nose normal. Left canal cleared and hearing improved after irrigation Mouth/Throat: Oropharynx is clear and moist  Neck: Normal range of motion. Neck supple. No JVD present. No tracheal deviation present or significant neck LA or mass Cardiovascular: Normal rate, regular rhythm, normal heart sounds and intact distal pulses.   Pulmonary/Chest: Effort normal and breath sounds without rales or wheezing  Abdominal: Soft. Bowel sounds are normal. NT. No HSM , NT large reducible lower mid abd ventral hernia Musculoskeletal: Normal range of motion. Exhibits no edema Lymphadenopathy: Has no cervical adenopathy.  Neurological: Pt is alert and oriented to person, place, and time. Pt has normal reflexes. No cranial nerve deficit. Motor grossly intact Skin: Skin is warm and dry. No rash noted or new ulcers Psychiatric:  Has normal mood and affect. Behavior is normal.     Assessment & Plan:

## 2016-04-12 NOTE — Progress Notes (Signed)
Pre visit review using our clinic review tool, if applicable. No additional management support is needed unless otherwise documented below in the visit note. 

## 2016-04-12 NOTE — Patient Instructions (Signed)
Your left ear was irrigated of wax  Please continue all other medications as before, and refills have been done if requested.  Please have the pharmacy call with any other refills you may need.  Please continue your efforts at being more active, low cholesterol diet, and weight control.  You are otherwise up to date with prevention measures today.  Please keep your appointments with your specialists as you may have planned  Please return in 1 year for your yearly visit, or sooner if needed, with Lab testing done 3-5 days before

## 2016-04-15 NOTE — Assessment & Plan Note (Signed)
stable overall by history and exam, recent data reviewed with pt, and pt to continue medical treatment as before,  to f/u any worsening symptoms or concerns Lab Results  Component Value Date   LDLCALC 100 (H) 04/05/2016

## 2016-04-15 NOTE — Assessment & Plan Note (Signed)
stable overall by history and exam, recent data reviewed with pt, and pt to continue medical treatment as before,  to f/u any worsening symptoms or concerns BP Readings from Last 3 Encounters:  04/12/16 116/78  12/16/15 (!) 150/80  10/24/15 130/70

## 2016-04-15 NOTE — Assessment & Plan Note (Signed)
D/w pt, asympt, though large can be followed for now,  to f/u any worsening symptoms or concerns

## 2016-04-15 NOTE — Assessment & Plan Note (Addendum)
Improved s/p irrigation,  to f/u any worsening symptoms or concerns  In addition to the time spent performing CPE, I spent an additional 15 minutes face to face,in which greater than 50% of this time was spent in counseling and coordination of care for patient's illness as documented.

## 2016-04-15 NOTE — Assessment & Plan Note (Signed)

## 2016-04-15 NOTE — Assessment & Plan Note (Signed)
Has solitary kidney,  Lab Results  Component Value Date   CREATININE 1.33 04/05/2016   stable overall by history and exam, recent data reviewed with pt, and pt to continue medical treatment as before,  to f/u any worsening symptoms or concerns

## 2016-04-15 NOTE — Assessment & Plan Note (Signed)
Mild, asympt,  Lab Results  Component Value Date   HGBA1C 5.8 04/05/2016   Cont diet, wt control, and exercise efforts

## 2016-08-14 ENCOUNTER — Ambulatory Visit (INDEPENDENT_AMBULATORY_CARE_PROVIDER_SITE_OTHER): Payer: 59 | Admitting: Family

## 2016-08-14 ENCOUNTER — Encounter: Payer: Self-pay | Admitting: Family

## 2016-08-14 VITALS — BP 132/70 | HR 78 | Temp 99.2°F | Resp 16 | Ht 74.0 in | Wt 176.0 lb

## 2016-08-14 DIAGNOSIS — R05 Cough: Secondary | ICD-10-CM

## 2016-08-14 DIAGNOSIS — R059 Cough, unspecified: Secondary | ICD-10-CM

## 2016-08-14 MED ORDER — LEVOFLOXACIN 500 MG PO TABS: 500.0000 mg | ORAL_TABLET | Freq: Every day | ORAL | 0 refills | Status: DC

## 2016-08-14 NOTE — Assessment & Plan Note (Signed)
Symptoms and exam consistent with bacterial sinusitis. Start levofloxacin. Continue over-the-counter medications as needed for symptom relief and supportive care. Follow-up if symptoms worsen or do not improve. 

## 2016-08-14 NOTE — Progress Notes (Signed)
Subjective:    Patient ID: Brandon Sanders, male    DOB: 1955/10/28, 60 y.o.   MRN: [id number]  Chief Complaint  Patient presents with  . Cough    cough and congestion, x2 weeks    HPI:  Brandon Sanders is a 60 y.o. male who  has a past medical history of Carpal tunnel syndrome, bilateral; Chronic renal insufficiency; COLITIS (12/17/2007); DJD of shoulder; Erectile dysfunction; HTN (hypertension); Hyperlipidemia; PLANTAR FASCIITIS (09/05/2010); Renal cell cancer ([place]); Solitary kidney, acquired; and SYNCOPE (09/05/2010). and presents today for an acute office visit.  This is a new problem. Associated symptoms of cough, congestion, sinus pressure and headache. Modifying factors include antihistamine and cold relief medications which has helped with some of the symptoms. Possible low grade fever. Has a daughter who is sick. No recent antibiotics. Over the course of the 2 weeks he describes symptoms as worsening over the past couple of days following initial improvements.   Allergies  Allergen Reactions  . Penicillins       Outpatient Medications Prior to Visit  Medication Sig Dispense Refill  . amLODipine (NORVASC) 5 MG tablet Take 1 tablet (5 mg total) by mouth daily. 90 tablet 3  . aspirin 81 MG EC tablet Take 81 mg by mouth daily.      Marland Kitchen azelastine (OPTIVAR) 0.05 % ophthalmic solution Place 1 drop into both eyes 2 (two) times daily. 6 mL 5  . everolimus (AFINITOR) 5 MG tablet Take 5 mg by mouth daily.    . fenofibrate 160 MG tablet Take 1 tablet (160 mg total) by mouth daily. 90 tablet 3  . metoprolol succinate (TOPROL-XL) 50 MG 24 hr tablet Take 1 and 1/2 tablets by  mouth every day with or  immediately following a  meal 135 tablet 3  . pravastatin (PRAVACHOL) 40 MG tablet Take 1 tablet (40 mg total) by mouth daily. 90 tablet 3   No facility-administered medications prior to visit.       Review of Systems  Constitutional: Negative for chills and fever.  HENT: Positive for  congestion, sinus pain and sinus pressure. Negative for ear pain and sore throat.   Respiratory: Positive for cough. Negative for chest tightness and shortness of breath.   Neurological: Positive for headaches.      Objective:    BP 132/70 (BP Location: Left Arm, Patient Position: Sitting, Cuff Size: Normal)   Pulse 78   Temp 99.2 F (37.3 C) (Oral)   Resp 16   Ht 6\' 2"  (1.88 m)   Wt 176 lb (79.8 kg)   SpO2 97%   BMI 22.60 kg/m  Nursing note and vital signs reviewed.  Physical Exam  Constitutional: He is oriented to person, place, and time. He appears well-developed and well-nourished. No distress.  HENT:  Right Ear: Hearing, tympanic membrane, external ear and ear canal normal.  Left Ear: Hearing, tympanic membrane, external ear and ear canal normal.  Nose: Nose normal. Right sinus exhibits no maxillary sinus tenderness and no frontal sinus tenderness. Left sinus exhibits no maxillary sinus tenderness and no frontal sinus tenderness.  Mouth/Throat: Uvula is midline, oropharynx is clear and moist and mucous membranes are normal.  Cardiovascular: Normal rate, regular rhythm, normal heart sounds and intact distal pulses.   Pulmonary/Chest: Effort normal and breath sounds normal.  Neurological: He is alert and oriented to person, place, and time.  Skin: Skin is warm and dry.  Psychiatric: He has a normal mood and affect. His behavior is  normal. Judgment and thought content normal.       Assessment & Plan:   Problem List Items Addressed This Visit      Other   Cough - Primary    Symptoms and exam consistent with bacterial sinusitis. Start levofloxacin. Continue over-the-counter medications as needed for symptom relief and supportive care. Follow up if symptoms worsen or do not improve.       Relevant Medications   levofloxacin (LEVAQUIN) 500 MG tablet       I am having Brandon Sanders start on levofloxacin. I am also having him maintain his aspirin, everolimus, azelastine,  amLODipine, fenofibrate, metoprolol succinate, pravastatin, and pentoxifylline.   Meds ordered this encounter  Medications  . pentoxifylline (TRENTAL) 400 MG CR tablet    Sig: Take 400 mg by mouth 3 (three) times daily with meals.  Marland Kitchen levofloxacin (LEVAQUIN) 500 MG tablet    Sig: Take 1 tablet (500 mg total) by mouth daily.    Dispense:  7 tablet    Refill:  0    Order Specific Question:   Supervising Provider    Answer:   Pricilla Holm A [id number]     Follow-up: Return if symptoms worsen or fail to improve.  Mauricio Po, FNP

## 2016-08-14 NOTE — Patient Instructions (Signed)
Thank you for choosing [facility].  SUMMARY AND INSTRUCTIONS:  Medication:  Your prescription(s) have been submitted to your pharmacy or been printed and provided for you. Please take as directed and contact our office if you believe you are having problem(s) with the medication(s) or have any questions.  Labs:  Please stop by the lab on the lower level of the building for your blood work. Your results will be released to [place] (or called to you) after review, usually within 72 hours after test completion. If any changes need to be made, you will be notified at that same time.  1.) The lab is open from 7:30am to 5:30 pm Monday-Friday 2.) No appointment is necessary 3.) Fasting (if needed) is 6-8 hours after food and drink; black coffee and water are okay   Follow up:  If your symptoms worsen or fail to improve, please contact our office for further instruction, or in case of emergency go directly to the emergency room at the closest medical facility.    General Recommendations:    Please drink plenty of fluids.  Get plenty of rest   Sleep in humidified air  Use saline nasal sprays  Netti pot   OTC Medications:  Decongestants - helps relieve congestion   Flonase (generic fluticasone) or Nasacort (generic triamcinolone) - please make sure to use the "cross-over" technique at a 45 degree angle towards the opposite eye as opposed to straight up the nasal passageway.   Sudafed (generic pseudoephedrine - Note this is the one that is available behind the pharmacy counter); Products with phenylephrine (-PE) may also be used but is often not as effective as pseudoephedrine.   If you have HIGH BLOOD PRESSURE - Coricidin HBP; AVOID any product that is -D as this contains pseudoephedrine which may increase your blood pressure.  Afrin (oxymetazoline) every 6-8 hours for up to 3 days.   Allergies - helps relieve runny nose, itchy eyes and sneezing   Claritin (generic  loratidine), Allegra (fexofenidine), or Zyrtec (generic cyrterizine) for runny nose. These medications should not cause drowsiness.  Note - Benadryl (generic diphenhydramine) may be used however may cause drowsiness  Cough -   Delsym or Robitussin (generic dextromethorphan)  Expectorants - helps loosen mucus to ease removal   Mucinex (generic guaifenesin) as directed on the package.  Headaches / General Aches   Tylenol (generic acetaminophen) - DO NOT EXCEED 3 grams (3,000 mg) in a 24 hour time period  Advil/Motrin (generic ibuprofen)   Sore Throat -   Salt water gargle   Chloraseptic (generic benzocaine) spray or lozenges / Sucrets (generic dyclonine)

## 2016-09-27 DIAGNOSIS — D1803 Hemangioma of intra-abdominal structures: Secondary | ICD-10-CM | POA: Diagnosis not present

## 2016-09-27 DIAGNOSIS — C642 Malignant neoplasm of left kidney, except renal pelvis: Secondary | ICD-10-CM | POA: Diagnosis not present

## 2016-09-27 DIAGNOSIS — C751 Malignant neoplasm of pituitary gland: Secondary | ICD-10-CM | POA: Diagnosis not present

## 2016-09-27 DIAGNOSIS — C779 Secondary and unspecified malignant neoplasm of lymph node, unspecified: Secondary | ICD-10-CM | POA: Diagnosis not present

## 2016-09-27 DIAGNOSIS — Z79899 Other long term (current) drug therapy: Secondary | ICD-10-CM | POA: Diagnosis not present

## 2016-10-04 DIAGNOSIS — C772 Secondary and unspecified malignant neoplasm of intra-abdominal lymph nodes: Secondary | ICD-10-CM | POA: Diagnosis not present

## 2016-10-23 DIAGNOSIS — C642 Malignant neoplasm of left kidney, except renal pelvis: Secondary | ICD-10-CM | POA: Diagnosis not present

## 2016-10-23 DIAGNOSIS — C779 Secondary and unspecified malignant neoplasm of lymph node, unspecified: Secondary | ICD-10-CM | POA: Diagnosis not present

## 2016-10-23 DIAGNOSIS — Z79899 Other long term (current) drug therapy: Secondary | ICD-10-CM | POA: Diagnosis not present

## 2017-03-15 ENCOUNTER — Ambulatory Visit (INDEPENDENT_AMBULATORY_CARE_PROVIDER_SITE_OTHER): Payer: 59 | Admitting: Internal Medicine

## 2017-03-15 ENCOUNTER — Encounter: Payer: Self-pay | Admitting: Internal Medicine

## 2017-03-15 VITALS — BP 142/80 | HR 76 | Ht 74.0 in | Wt 181.0 lb

## 2017-03-15 DIAGNOSIS — R519 Headache, unspecified: Secondary | ICD-10-CM | POA: Insufficient documentation

## 2017-03-15 DIAGNOSIS — R51 Headache: Secondary | ICD-10-CM | POA: Diagnosis not present

## 2017-03-15 DIAGNOSIS — R739 Hyperglycemia, unspecified: Secondary | ICD-10-CM

## 2017-03-15 DIAGNOSIS — I10 Essential (primary) hypertension: Secondary | ICD-10-CM

## 2017-03-15 MED ORDER — GABAPENTIN 100 MG PO CAPS: 100.0000 mg | ORAL_CAPSULE | Freq: Three times a day (TID) | ORAL | 3 refills | Status: DC

## 2017-03-15 NOTE — Assessment & Plan Note (Signed)
stable overall by history and exam, recent data reviewed with pt, and pt to continue medical treatment as before,  to f/u any worsening symptoms or concerns, for f/u lab next visit 

## 2017-03-15 NOTE — Patient Instructions (Addendum)
Please take all new medication as prescribed - the gabapentin at 100 mg three per day as needed  If you do not have pain, you can hold on taking the medication  If the 100 mg does not work well, you can try 200 mg at a time to see if works better  Please continue all other medications as before, and refills have been done if requested.  Please have the pharmacy call with any other refills you may need.  Please keep your appointments with your specialists as you may have planned  Please return in 1 month for your yearly visit, or sooner if needed, with Lab testing done 3-5 days before

## 2017-03-15 NOTE — Assessment & Plan Note (Signed)
stable overall by history and exam, recent data reviewed with pt, and pt to continue medical treatment as before,  to f/u any worsening symptoms or concerns BP Readings from Last 3 Encounters:  03/15/17 (!) 142/80  08/14/16 132/70  04/12/16 116/78

## 2017-03-15 NOTE — Progress Notes (Signed)
Subjective:    Patient ID: DUTCH ING, male    DOB: 06/15/1956, 61 y.o.   MRN: [id number]  HPI  Here to f/u,  C/o 1 wk left facial pain and Headache from temporal to jaw area without swelling, rash.  Dull and achy pain, constant, not worse with chewing, talking or swallowing, advil helps and overall actually feels somewhat improved  Has seen dental with neg exam earlier this wk.  Has been seen 2014 here for similar for pain after 2 mo, before finally seeing more, rx tegretol and pain gone in 3 days. Stopped the tegretol, felt it made him sluggish and drunk after 3 days, but fortunately the pain resolved and only now seems to have recurred. Still on oral CMT related to abnormal LA due to renal cell ca, followed closely for 5 yrs on affinitor per oncology  Pt denies chest pain, increased sob or doe, wheezing, orthopnea, PND, increased LE swelling, palpitations, dizziness or syncope.   Pt denies polydipsia, polyuria,     Past Medical History:  Diagnosis Date  . Carpal tunnel syndrome, bilateral   . Chronic renal insufficiency   . COLITIS 12/17/2007  . DJD of shoulder   . Erectile dysfunction    managed with injections; no response with Viagra/Cialis  . HTN (hypertension)   . Hyperlipidemia   . PLANTAR FASCIITIS 09/05/2010  . Renal cell cancer ([place])    currently in clinical trial using Afinitor  . Solitary kidney, acquired   . SYNCOPE 09/05/2010   Past Surgical History:  Procedure Laterality Date  . NEPHRECTOMY     Right  . s.p right CTS release    . s/p right ulnar nerve release      reports that he has never smoked. He has never used smokeless tobacco. He reports that he does not drink alcohol or use drugs. family history includes Heart disease in his other; Hypertension (age of onset: 58) in his father; Liver cancer in his maternal grandmother; Stroke in his father; Transient ischemic attack in his father. Allergies  Allergen Reactions  . Penicillins    Current Outpatient  Prescriptions on File Prior to Visit  Medication Sig Dispense Refill  . amLODipine (NORVASC) 5 MG tablet Take 1 tablet (5 mg total) by mouth daily. 90 tablet 3  . aspirin 81 MG EC tablet Take 81 mg by mouth daily.      Marland Kitchen azelastine (OPTIVAR) 0.05 % ophthalmic solution Place 1 drop into both eyes 2 (two) times daily. 6 mL 5  . everolimus (AFINITOR) 5 MG tablet Take 5 mg by mouth daily.    . fenofibrate 160 MG tablet Take 1 tablet (160 mg total) by mouth daily. 90 tablet 3  . metoprolol succinate (TOPROL-XL) 50 MG 24 hr tablet Take 1 and 1/2 tablets by  mouth every day with or  immediately following a  meal 135 tablet 3  . pentoxifylline (TRENTAL) 400 MG CR tablet Take 400 mg by mouth 3 (three) times daily with meals.    . pravastatin (PRAVACHOL) 40 MG tablet Take 1 tablet (40 mg total) by mouth daily. 90 tablet 3   No current facility-administered medications on file prior to visit.    Review of Systems  Constitutional: Negative for other unusual diaphoresis or sweats HENT: Negative for ear discharge or swelling Eyes: Negative for other worsening visual disturbances Respiratory: Negative for stridor or other swelling  Gastrointestinal: Negative for worsening distension or other blood Genitourinary: Negative for retention or other urinary change  Musculoskeletal: Negative for other MSK pain or swelling Skin: Negative for color change or other new lesions Neurological: Negative for worsening tremors and other numbness  Psychiatric/Behavioral: Negative for worsening agitation or other fatigue All other system neg per pt    Objective:   Physical Exam BP (!) 142/80 (BP Location: Left Arm, Patient Position: Sitting, Cuff Size: Normal)   Pulse 76   Ht 6\' 2"  (1.88 m)   Wt 181 lb (82.1 kg)   SpO2 99%   BMI 23.24 kg/m  VS noted, not ill appearing Constitutional: Pt appears in NAD HENT: Head: NCAT.  Right Ear: External ear normal.  Left Ear: External ear normal.  Eyes: . Pupils are equal,  round, and reactive to light.  Bilat tm's with no erythema.  Max sinus areas non tender.  Pharynx with no erythema, no exudate; no dental pain or swelling Conjunctivae and EOM are normal Nose: without d/c or deformity Neck: Neck supple. Gross normal ROM or significant LA Cardiovascular: Normal rate and regular rhythm.   Pulmonary/Chest: Effort normal and breath sounds without rales or wheezing.  Abd:  Soft, NT, ND, + BS, no organomegaly Neurological: Pt is alert. At baseline orientation, motor grossly intact Skin: Skin is warm. No rashes, other new lesions, no LE edema Psychiatric: Pt behavior is normal without agitation  No other exam findings  Lab Results  Component Value Date   WBC 5.2 04/05/2016   HGB 13.8 04/05/2016   HCT 40.7 04/05/2016   PLT 300.0 04/05/2016   GLUCOSE 98 04/05/2016   CHOL 173 04/05/2016   TRIG 146.0 04/05/2016   HDL 44.00 04/05/2016   LDLDIRECT 90.2 10/27/2009   LDLCALC 100 (H) 04/05/2016   ALT 18 04/05/2016   AST 22 04/05/2016   NA 138 04/05/2016   K 4.2 04/05/2016   CL 103 04/05/2016   CREATININE 1.33 04/05/2016   BUN 20 04/05/2016   CO2 30 04/05/2016   TSH 2.33 04/05/2016   PSA 1.73 04/05/2016   INR 0.92 02/16/2011   HGBA1C 5.8 04/05/2016   MICROALBUR 1.1 04/05/2016         Assessment & Plan:

## 2017-03-15 NOTE — Assessment & Plan Note (Signed)
Exam benign, but hx and exam c/w prob recurrent Trigeminal Neuralgia, I think maybe less severe than before, actually some improved today, advil helps, but will add gabapentin 100 tid prn, cont to monitor, consider MRI head if not improved

## 2017-04-11 DIAGNOSIS — C751 Malignant neoplasm of pituitary gland: Secondary | ICD-10-CM | POA: Diagnosis not present

## 2017-04-11 DIAGNOSIS — C779 Secondary and unspecified malignant neoplasm of lymph node, unspecified: Secondary | ICD-10-CM | POA: Diagnosis not present

## 2017-04-11 DIAGNOSIS — C642 Malignant neoplasm of left kidney, except renal pelvis: Secondary | ICD-10-CM | POA: Diagnosis not present

## 2017-04-11 DIAGNOSIS — Z79899 Other long term (current) drug therapy: Secondary | ICD-10-CM | POA: Diagnosis not present

## 2017-04-16 ENCOUNTER — Encounter: Payer: Self-pay | Admitting: Internal Medicine

## 2017-04-16 ENCOUNTER — Ambulatory Visit (INDEPENDENT_AMBULATORY_CARE_PROVIDER_SITE_OTHER): Payer: 59 | Admitting: Internal Medicine

## 2017-04-16 VITALS — BP 136/76 | HR 65 | Ht 74.0 in | Wt 183.0 lb

## 2017-04-16 DIAGNOSIS — K439 Ventral hernia without obstruction or gangrene: Secondary | ICD-10-CM

## 2017-04-16 DIAGNOSIS — J309 Allergic rhinitis, unspecified: Secondary | ICD-10-CM | POA: Diagnosis not present

## 2017-04-16 DIAGNOSIS — M79601 Pain in right arm: Secondary | ICD-10-CM

## 2017-04-16 DIAGNOSIS — R21 Rash and other nonspecific skin eruption: Secondary | ICD-10-CM

## 2017-04-16 DIAGNOSIS — Z0001 Encounter for general adult medical examination with abnormal findings: Secondary | ICD-10-CM

## 2017-04-16 MED ORDER — KETOCONAZOLE 2 % EX CREA: 1.0000 "application " | TOPICAL_CREAM | Freq: Every day | CUTANEOUS | 1 refills | Status: DC

## 2017-04-16 NOTE — Progress Notes (Signed)
Subjective:    Patient ID: Brandon Sanders, male    DOB: February 22, 1956, 61 y.o.   MRN: [id number]  HPI  Here for wellness and f/u;  Overall doing ok;  Pt denies Chest pain, worsening SOB, DOE, wheezing, orthopnea, PND, worsening LE edema, palpitations, dizziness or syncope. .  Pt denies polydipsia, polyuria, or low sugar symptoms. Pt states overall good compliance with treatment and medications, good tolerability, and has been trying to follow appropriate diet.  Pt denies worsening depressive symptoms, suicidal ideation or panic. No fever, night sweats, wt loss, loss of appetite, or other constitutional symptoms.  Pt states good ability with ADL's, has low fall risk, home safety reviewed and adequate, no other significant changes in hearing or vision, and trying to stay active.  Cont's to have regular f/u at [facility] for f/u renal ca, remains stable overall on affinitor.   Does have several wks ongoing nasal allergy symptoms with clearish congestion, itch and sneezing, without fever, pain, ST, cough, swelling or wheezing.  Also has rash to glans penis red itchy non vesicular for several wks just not getting better.  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  Also c/o burning electrical pain mild intermittent x 2 mo, worse to wake up with arm bent in the AM, maybe assoc with intermittent right neck pain or elbow pain but not clear, o/w  Pt denies neurological change such as new headache, facial or extremity weakness Past Medical History:  Diagnosis Date  . Carpal tunnel syndrome, bilateral   . Chronic renal insufficiency   . COLITIS 12/17/2007  . DJD of shoulder   . Erectile dysfunction    managed with injections; no response with Viagra/Cialis  . HTN (hypertension)   . Hyperlipidemia   . PLANTAR FASCIITIS 09/05/2010  . Renal cell cancer ([place])    currently in clinical trial using Afinitor  . Solitary kidney, acquired   . SYNCOPE 09/05/2010   Past Surgical History:  Procedure  Laterality Date  . NEPHRECTOMY     Right  . s.p right CTS release    . s/p right ulnar nerve release      reports that he has never smoked. He has never used smokeless tobacco. He reports that he does not drink alcohol or use drugs. family history includes Heart disease in his other; Hypertension (age of onset: 35) in his father; Liver cancer in his maternal grandmother; Stroke in his father; Transient ischemic attack in his father. Allergies  Allergen Reactions  . Penicillins    Current Outpatient Prescriptions on File Prior to Visit  Medication Sig Dispense Refill  . amLODipine (NORVASC) 5 MG tablet Take 1 tablet (5 mg total) by mouth daily. 90 tablet 3  . aspirin 81 MG EC tablet Take 81 mg by mouth daily.      Marland Kitchen azelastine (OPTIVAR) 0.05 % ophthalmic solution Place 1 drop into both eyes 2 (two) times daily. 6 mL 5  . everolimus (AFINITOR) 5 MG tablet Take 5 mg by mouth daily.    . fenofibrate 160 MG tablet Take 1 tablet (160 mg total) by mouth daily. 90 tablet 3  . gabapentin (NEURONTIN) 100 MG capsule Take 1 capsule (100 mg total) by mouth 3 (three) times daily. 90 capsule 3  . metoprolol succinate (TOPROL-XL) 50 MG 24 hr tablet Take 1 and 1/2 tablets by  mouth every day with or  immediately following a  meal 135 tablet 3  . pentoxifylline (TRENTAL) 400 MG CR tablet Take  400 mg by mouth 3 (three) times daily with meals.    . pravastatin (PRAVACHOL) 40 MG tablet Take 1 tablet (40 mg total) by mouth daily. 90 tablet 3   No current facility-administered medications on file prior to visit.    Review of Systems Constitutional: Negative for other unusual diaphoresis, sweats, appetite or weight changes HENT: Negative for other worsening hearing loss, ear pain, facial swelling, mouth sores or neck stiffness.   Eyes: Negative for other worsening pain, redness or other visual disturbance.  Respiratory: Negative for other stridor or swelling Cardiovascular: Negative for other palpitations or  other chest pain  Gastrointestinal: Negative for worsening diarrhea or loose stools, blood in stool, distention or other pain Genitourinary: Negative for hematuria, flank pain or other change in urine volume.  Musculoskeletal: Negative for myalgias or other joint swelling.  Skin: Negative for other color change, or other wound or worsening drainage.  Neurological: Negative for other syncope or numbness. Hematological: Negative for other adenopathy or swelling Psychiatric/Behavioral: Negative for hallucinations, other worsening agitation, SI, self-injury, or new decreased concentration All other system neg per pt    Objective:   Physical Exam BP 136/76   Pulse 65   Ht 6\' 2"  (1.88 m)   Wt 183 lb (83 kg)   SpO2 100%   BMI 23.50 kg/m  VS noted, thin for ht Constitutional: Pt is oriented to person, place, and time. Appears well-developed and well-nourished, in no significant distress and comfortable Head: Normocephalic and atraumatic  Eyes: Conjunctivae and EOM are normal. Pupils are equal, round, and reactive to light Bilat tm's with mild erythema.  Max sinus areas non tender.  Pharynx with mild erythema, no exudate  Right Ear: External ear normal without discharge Left Ear: External ear normal without discharge Nose: Nose without discharge or deformity Mouth/Throat: Oropharynx is without other ulcerations and moist  Neck: Normal range of motion. Neck supple. No JVD present. No tracheal deviation present or significant neck LA or mass Cardiovascular: Normal rate, regular rhythm, normal heart sounds and intact distal pulses.   Pulmonary/Chest: WOB normal and breath sounds without rales or wheezing  Abdominal: Soft. Bowel sounds are normal. NT. No HSM , low mid abd herniation noted nontender, reducible Musculoskeletal: Normal range of motion. Exhibits no edema Lymphadenopathy: Has no other cervical adenopathy.  Neurological: Pt is alert and oriented to person, place, and time. Pt has  normal reflexes. No cranial nerve deficit. Motor grossly intact, Gait intact Skin: Skin is warm and dry. No rash noted or new ulcerations Psychiatric:  Has normal mood and affect. Behavior is normal without agitation No other exam findings Lab Results  Component Value Date   WBC 5.2 04/05/2016   HGB 13.8 04/05/2016   HCT 40.7 04/05/2016   PLT 300.0 04/05/2016   GLUCOSE 98 04/05/2016   CHOL 173 04/05/2016   TRIG 146.0 04/05/2016   HDL 44.00 04/05/2016   LDLDIRECT 90.2 10/27/2009   LDLCALC 100 (H) 04/05/2016   ALT 18 04/05/2016   AST 22 04/05/2016   NA 138 04/05/2016   K 4.2 04/05/2016   CL 103 04/05/2016   CREATININE 1.33 04/05/2016   BUN 20 04/05/2016   CO2 30 04/05/2016   TSH 2.33 04/05/2016   PSA 1.73 04/05/2016   INR 0.92 02/16/2011   HGBA1C 5.8 04/05/2016   MICROALBUR 1.1 04/05/2016       Assessment & Plan:

## 2017-04-16 NOTE — Patient Instructions (Signed)
Please take all new medication as prescribed - the ketoconozole cream as needed  OK to continue the flonase, or change to Nasacort OTC  Please continue all other medications as before, and refills have been done if requested.  Please have the pharmacy call with any other refills you may need.  Please continue your efforts at being more active, low cholesterol diet, and weight control.  You are otherwise up to date with prevention measures today.  Please keep your appointments with your specialists as you may have planned  You will be contacted regarding the referral for: Nerve test for the right arm  You will be contacted by phone if any changes need to be made immediately.  Otherwise, you will receive a letter about your results with an explanation, but please check with MyChart first.  Please remember to sign up for MyChart if you have not done so, as this will be important to you in the future with finding out test results, communicating by private email, and scheduling acute appointments online when needed.  Please return in 1 year for your yearly visit, or sooner if needed, with Lab testing done 3-5 days before

## 2017-04-18 DIAGNOSIS — R21 Rash and other nonspecific skin eruption: Secondary | ICD-10-CM | POA: Insufficient documentation

## 2017-04-18 DIAGNOSIS — K439 Ventral hernia without obstruction or gangrene: Secondary | ICD-10-CM | POA: Insufficient documentation

## 2017-04-18 DIAGNOSIS — J309 Allergic rhinitis, unspecified: Secondary | ICD-10-CM | POA: Insufficient documentation

## 2017-04-18 NOTE — Assessment & Plan Note (Signed)

## 2017-04-18 NOTE — Assessment & Plan Note (Addendum)
Asympt, benign exam, pt reassured, cont to monitor for any worsening pain or size

## 2017-04-18 NOTE — Assessment & Plan Note (Signed)
Ok to restart flonase asd,  to f/u any worsening symptoms or concerns 

## 2017-04-18 NOTE — Assessment & Plan Note (Addendum)
I suspect c/w neuritic pain but unclear etiology, for NCS/EMG r/o cervical vs elbow  In addition to the time spent performing CPE, I spent an additional 15 minutes face to face,in which greater than 50% of this time was spent in counseling and coordination of care for patient's illness as documented, including the differential diagnosis, evaluation, tx and further management of right arm pain, allergic rhinitis, penile rash, and abd wall hernia

## 2017-04-18 NOTE — Assessment & Plan Note (Signed)
Glans penis, possible fungal, for ketoconozole cream prn,  to f/u any worsening symptoms or concerns

## 2017-04-19 ENCOUNTER — Other Ambulatory Visit: Payer: Self-pay | Admitting: Internal Medicine

## 2017-04-27 ENCOUNTER — Other Ambulatory Visit: Payer: Self-pay | Admitting: Internal Medicine

## 2017-05-09 ENCOUNTER — Ambulatory Visit (INDEPENDENT_AMBULATORY_CARE_PROVIDER_SITE_OTHER): Payer: 59 | Admitting: Diagnostic Neuroimaging

## 2017-05-09 ENCOUNTER — Encounter (INDEPENDENT_AMBULATORY_CARE_PROVIDER_SITE_OTHER): Payer: Self-pay | Admitting: Diagnostic Neuroimaging

## 2017-05-09 DIAGNOSIS — Z0289 Encounter for other administrative examinations: Secondary | ICD-10-CM

## 2017-05-09 DIAGNOSIS — M79601 Pain in right arm: Secondary | ICD-10-CM

## 2017-05-10 NOTE — Procedures (Signed)
[facility]  NCS (NERVE CONDUCTION STUDY) WITH EMG (ELECTROMYOGRAPHY) REPORT   STUDY DATE: 05/09/17 PATIENT NAME: Brandon Sanders DOB: 11-29-1955 MRN: [id number]  ORDERING CLINICIAN: Cathlean Cower, MD  TECHNOLOGIST: Oneita Jolly ELECTROMYOGRAPHER: Earlean Polka. Yuette Putnam, MD  CLINICAL INFORMATION: 61 year old male with right arm itching, pain and numbness.  FINDINGS: NERVE CONDUCTION STUDY: Right median motor response has normal distal latency, amplitude, borderline slow conduction velocity.  Left median motor response has prolonged distal latency, normal amplitude, slow conduction velocity.  Right ulnar motor response has normal distal latency, amplitude, borderline slow conduction velocity.  Left ulnar motor response is normal.  Bilateral axillary motor responses are normal.  Bilateral median to ulnar transcarpal comparisons are prolonged (right 0.5 ms, left 1.0 ms).  Bilateral median and ulnar sensory responses have prolonged peak latencies. Bilateral median and right ulnar sensory responses have decreased amplitudes. Left ulnar sensory response has normal amplitude.  Right ulnar F wave latency is slightly prolonged. Left ulnar F wave latency is normal.   NEEDLE ELECTROMYOGRAPHY:  Right upper extremity deltoid, biceps, triceps, flexor carpi radialis, first dorsal interosseous, right cervical paraspinal muscles are normal.    IMPRESSION:  Mildly abnormal study demonstrate: 1. Left median neuropathy the wrist consistent with left carpal tunnel syndrome. 2. Borderline right median neuropathy the wrist consistent with borderline right carpal tunnel syndrome. 3. Right ulnar motor and bilateral ulnar sensory response abnormalities may be due to underlying bilateral polyneuropathy. This is not further localized on needle EMG.  4. No evidence of right cervical radiculopathy or brachial plexopathy at this time.     INTERPRETING PHYSICIAN:  Penni Bombard, MD Certified in Neurology, Neurophysiology and Neuroimaging  [facility] [address] [place], [place] [address] [phone]   [facility]    Nerve / Sites Muscle Latency Ref. Amplitude Ref. Rel Amp Segments Distance Velocity Ref. Area    ms ms mV mV %  cm m/s m/s mVms  R Median - APB     Wrist APB 4.3 ?4.4 6.5 ?4.0 100 Wrist - APB 7   27.7     Upper arm APB 9.1  6.3  96.9 Upper arm - Wrist 23 47 ?49 26.5  L Median - APB     Wrist APB 5.0 ?4.4 6.2 ?4.0 100 Wrist - APB 7   28.7     Upper arm APB 10.3  5.5  88 Upper arm - Wrist 23 44 ?49 26.4  R Ulnar - ADM     Wrist ADM 3.1 ?3.3 8.7 ?6.0 100 Wrist - ADM 7   26.3     B.Elbow ADM 7.9  8.0  91.5 B.Elbow - Wrist 22 46 ?49 23.6     A.Elbow ADM 10.6  7.4  93.6 A.Elbow - B.Elbow 12 44 ?49 24.3     Axilla ADM 15.1  6.7  90.5 Axilla - A.Elbow 19 42  20.4     Supraclav fossa ADM 17.4  5.8  85.4 Supraclav fossa - Axilla 10 43  18.3         A.Elbow - Wrist      L Ulnar - ADM     Wrist ADM 2.8 ?3.3 8.2 ?6.0 100 Wrist - ADM 7   28.5     B.Elbow ADM 7.3  7.8  94.7 B.Elbow - Wrist 22 49 ?49 27.3     A.Elbow ADM 9.7  7.2  92.6 A.Elbow - B.Elbow 12 50 ?49 28.6     Axilla  ADM 13.6  6.5  89.9 Axilla - A.Elbow 19 49  24.4     Supraclav fossa ADM 15.8  5.7  87.6 Supraclav fossa - Axilla 10 46  20.4         A.Elbow - Wrist      R Axillary - Deltoid     Supraclav fossa Deltoid 4.6  8.3  100     85.3  L Axillary - Deltoid     Supraclav fossa Deltoid 4.4  8.3  100     91.0                 SNC    Nerve / Sites Rec. Site Peak Lat Ref.  Amp Ref. Segments Distance Peak Diff Ref.    ms ms V V  cm ms ms  R Radial - Anatomical snuff box (Forearm)     Forearm Wrist 2.7 ?2.9 11 ?15 Forearm - Wrist 10    L Radial - Anatomical snuff box (Forearm)     Forearm Wrist 2.7 ?2.9 13 ?15 Forearm - Wrist 10    R Median, Ulnar - Transcarpal comparison     Median Palm Wrist 2.9 ?2.2 26 ?35 Median Palm - Wrist 8       Ulnar Palm Wrist  2.4 ?2.2 5 ?12 Ulnar Palm - Wrist 8          Median Palm - Ulnar Palm  0.5 ?0.4  L Median, Ulnar - Transcarpal comparison     Median Palm Wrist 3.5 ?2.2 24 ?35 Median Palm - Wrist 8       Ulnar Palm Wrist 2.4 ?2.2 14 ?12 Ulnar Palm - Wrist 8          Median Palm - Ulnar Palm  1.0 ?0.4  R Median - Orthodromic (Dig II, Mid palm)     Dig II Wrist 4.0 ?3.4 4 ?10 Dig II - Wrist 13    L Median - Orthodromic (Dig II, Mid palm)     Dig II Wrist 4.6 ?3.4 7 ?10 Dig II - Wrist 13    R Ulnar - Orthodromic, (Dig V, Mid palm)     Dig V Wrist 3.2 ?3.1 4 ?5 Dig V - Wrist 11    L Ulnar - Orthodromic, (Dig V, Mid palm)     Dig V Wrist 3.3 ?3.1 8 ?5 Dig V - Wrist 81                       F  Wave    Nerve F Lat Ref.   ms ms  R Ulnar - ADM 34.6 ?32.0  L Ulnar - ADM 32.9 ?32.0         EMG full       EMG Summary Table    Spontaneous MUAP Recruitment  Muscle IA Fib PSW Fasc Other Amp Dur. Poly Pattern  R. Deltoid Normal None None None _______ Normal Normal Normal Normal  R. Biceps brachii Normal None None None _______ Normal Normal Normal Normal  R. Triceps brachii Normal None None None _______ Normal Normal Normal Normal  R. Flexor carpi radialis Normal None None None _______ Normal Normal Normal Normal  R. First dorsal interosseous Normal None None None _______ Normal Normal Normal Normal  R. Cervical paraspinals Normal None None None _______ Normal Normal Normal Normal

## 2017-05-15 ENCOUNTER — Telehealth: Payer: Self-pay | Admitting: Internal Medicine

## 2017-05-15 ENCOUNTER — Encounter: Payer: Self-pay | Admitting: Internal Medicine

## 2017-05-15 DIAGNOSIS — M542 Cervicalgia: Secondary | ICD-10-CM

## 2017-05-15 DIAGNOSIS — M5412 Radiculopathy, cervical region: Secondary | ICD-10-CM

## 2017-05-15 NOTE — Telephone Encounter (Signed)
Pt has been informed and expressed understanding. He did say that he doesn't understand the logic behind needing that referral to an ENT. I suggested to the patient if he would like to further discuss the situation and reasoning behind it with Dr. Jenny Reichmann than more than likely an appt would be needed. He understood.

## 2017-05-15 NOTE — Telephone Encounter (Signed)
I would be uncomfortable with a CT scan for this to start given the contrast involved and risk of kidney damage  I can refer to ENT if he would like

## 2017-05-15 NOTE — Telephone Encounter (Signed)
I think most likely would be best served by contacting his oncologist at [facility] since the concern would be for recurrent malignancy

## 2017-05-15 NOTE — Telephone Encounter (Signed)
Pt would like nurse to give him a call about test he had on the 30th and would like to know where he goes from here.  He stated that his neck is still hurting him and they were thinking that he may need a CT scan   Best number [phone]

## 2017-05-15 NOTE — Telephone Encounter (Signed)
Pt stated that he told you during the OV that he had already seen the his oncologist and the conversation that was had during the visit was that you were going to put in for other testing. Please advise. The patient would like to know where to go from here.

## 2017-05-24 ENCOUNTER — Ambulatory Visit
Admission: RE | Admit: 2017-05-24 | Discharge: 2017-05-24 | Disposition: A | Discharge status: Home / Self Care | Payer: 59 | Source: Ambulatory Visit | Attending: Internal Medicine | Admitting: Internal Medicine

## 2017-05-24 DIAGNOSIS — M4802 Spinal stenosis, cervical region: Secondary | ICD-10-CM | POA: Diagnosis not present

## 2017-05-24 DIAGNOSIS — M5412 Radiculopathy, cervical region: Secondary | ICD-10-CM

## 2017-05-24 DIAGNOSIS — M542 Cervicalgia: Secondary | ICD-10-CM

## 2017-05-27 ENCOUNTER — Other Ambulatory Visit: Payer: 59

## 2017-05-27 ENCOUNTER — Telehealth: Payer: Self-pay

## 2017-05-27 DIAGNOSIS — M5412 Radiculopathy, cervical region: Secondary | ICD-10-CM

## 2017-05-27 NOTE — Telephone Encounter (Signed)
-----   Message from Biagio Borg, MD sent at 05/24/2017  4:34 PM EDT ----- Left message on MyChart, pt to cont same tx except  The test results show that your current treatment is OK, except there are several levels of arthritis, deteriorating discs, and possible nerve pinching.  We should most likely refer you to to Orthopedic or Neurosurgury  Please consider this, and I will have the office call to ask if you have preference.    Brandon Sanders to please inform pt, and let me know if he a preference

## 2017-05-27 NOTE — Telephone Encounter (Signed)
Patient called back.  He is requesting to be referred to [facility].

## 2017-05-27 NOTE — Telephone Encounter (Signed)
Called pt, LVM.   

## 2017-05-28 NOTE — Telephone Encounter (Signed)
Ok, this is done 

## 2017-05-28 NOTE — Addendum Note (Signed)
Addended by: Biagio Borg on: 05/28/2017 08:32 AM   Modules accepted: Orders

## 2017-05-28 NOTE — Telephone Encounter (Signed)
Pt has been informed via MyChart.

## 2017-05-29 DIAGNOSIS — M5033 Other cervical disc degeneration, cervicothoracic region: Secondary | ICD-10-CM | POA: Diagnosis not present

## 2017-05-29 DIAGNOSIS — M531 Cervicobrachial syndrome: Secondary | ICD-10-CM | POA: Diagnosis not present

## 2017-05-29 DIAGNOSIS — M9901 Segmental and somatic dysfunction of cervical region: Secondary | ICD-10-CM | POA: Diagnosis not present

## 2017-05-30 DIAGNOSIS — M5033 Other cervical disc degeneration, cervicothoracic region: Secondary | ICD-10-CM | POA: Diagnosis not present

## 2017-05-30 DIAGNOSIS — M9901 Segmental and somatic dysfunction of cervical region: Secondary | ICD-10-CM | POA: Diagnosis not present

## 2017-05-30 DIAGNOSIS — M531 Cervicobrachial syndrome: Secondary | ICD-10-CM | POA: Diagnosis not present

## 2017-06-03 DIAGNOSIS — M531 Cervicobrachial syndrome: Secondary | ICD-10-CM | POA: Diagnosis not present

## 2017-06-03 DIAGNOSIS — M5033 Other cervical disc degeneration, cervicothoracic region: Secondary | ICD-10-CM | POA: Diagnosis not present

## 2017-06-03 DIAGNOSIS — M9901 Segmental and somatic dysfunction of cervical region: Secondary | ICD-10-CM | POA: Diagnosis not present

## 2017-06-04 DIAGNOSIS — M9901 Segmental and somatic dysfunction of cervical region: Secondary | ICD-10-CM | POA: Diagnosis not present

## 2017-06-04 DIAGNOSIS — M531 Cervicobrachial syndrome: Secondary | ICD-10-CM | POA: Diagnosis not present

## 2017-06-04 DIAGNOSIS — M5033 Other cervical disc degeneration, cervicothoracic region: Secondary | ICD-10-CM | POA: Diagnosis not present

## 2017-06-06 DIAGNOSIS — M9901 Segmental and somatic dysfunction of cervical region: Secondary | ICD-10-CM | POA: Diagnosis not present

## 2017-06-06 DIAGNOSIS — M5033 Other cervical disc degeneration, cervicothoracic region: Secondary | ICD-10-CM | POA: Diagnosis not present

## 2017-06-06 DIAGNOSIS — M531 Cervicobrachial syndrome: Secondary | ICD-10-CM | POA: Diagnosis not present

## 2017-06-07 DIAGNOSIS — Z23 Encounter for immunization: Secondary | ICD-10-CM | POA: Diagnosis not present

## 2017-06-10 DIAGNOSIS — M9901 Segmental and somatic dysfunction of cervical region: Secondary | ICD-10-CM | POA: Diagnosis not present

## 2017-06-10 DIAGNOSIS — M5033 Other cervical disc degeneration, cervicothoracic region: Secondary | ICD-10-CM | POA: Diagnosis not present

## 2017-06-10 DIAGNOSIS — M531 Cervicobrachial syndrome: Secondary | ICD-10-CM | POA: Diagnosis not present

## 2017-06-11 DIAGNOSIS — M5033 Other cervical disc degeneration, cervicothoracic region: Secondary | ICD-10-CM | POA: Diagnosis not present

## 2017-06-11 DIAGNOSIS — M9901 Segmental and somatic dysfunction of cervical region: Secondary | ICD-10-CM | POA: Diagnosis not present

## 2017-06-11 DIAGNOSIS — M531 Cervicobrachial syndrome: Secondary | ICD-10-CM | POA: Diagnosis not present

## 2017-06-13 DIAGNOSIS — M5033 Other cervical disc degeneration, cervicothoracic region: Secondary | ICD-10-CM | POA: Diagnosis not present

## 2017-06-13 DIAGNOSIS — M9901 Segmental and somatic dysfunction of cervical region: Secondary | ICD-10-CM | POA: Diagnosis not present

## 2017-06-13 DIAGNOSIS — M531 Cervicobrachial syndrome: Secondary | ICD-10-CM | POA: Diagnosis not present

## 2017-06-18 DIAGNOSIS — M531 Cervicobrachial syndrome: Secondary | ICD-10-CM | POA: Diagnosis not present

## 2017-06-18 DIAGNOSIS — M9901 Segmental and somatic dysfunction of cervical region: Secondary | ICD-10-CM | POA: Diagnosis not present

## 2017-06-18 DIAGNOSIS — M5033 Other cervical disc degeneration, cervicothoracic region: Secondary | ICD-10-CM | POA: Diagnosis not present

## 2017-07-04 DIAGNOSIS — M50823 Other cervical disc disorders at C6-C7 level: Secondary | ICD-10-CM | POA: Diagnosis not present

## 2017-07-04 DIAGNOSIS — M50822 Other cervical disc disorders at C5-C6 level: Secondary | ICD-10-CM | POA: Diagnosis not present

## 2017-07-04 DIAGNOSIS — M5010 Cervical disc disorder with radiculopathy, unspecified cervical region: Secondary | ICD-10-CM | POA: Diagnosis not present

## 2017-08-14 ENCOUNTER — Other Ambulatory Visit (INDEPENDENT_AMBULATORY_CARE_PROVIDER_SITE_OTHER): Payer: 59

## 2017-08-14 DIAGNOSIS — Z1159 Encounter for screening for other viral diseases: Secondary | ICD-10-CM

## 2017-08-14 DIAGNOSIS — Z0001 Encounter for general adult medical examination with abnormal findings: Secondary | ICD-10-CM

## 2017-08-14 DIAGNOSIS — R739 Hyperglycemia, unspecified: Secondary | ICD-10-CM

## 2017-08-14 LAB — CBC WITH DIFFERENTIAL/PLATELET
BASOS ABS: 0.1 10*3/uL (ref 0.0–0.1)
Basophils Relative: 1.1 % (ref 0.0–3.0)
Eosinophils Absolute: 0.1 10*3/uL (ref 0.0–0.7)
Eosinophils Relative: 1.9 % (ref 0.0–5.0)
HEMATOCRIT: 45.3 % (ref 39.0–52.0)
HEMOGLOBIN: 15 g/dL (ref 13.0–17.0)
LYMPHS PCT: 24.4 % (ref 12.0–46.0)
Lymphs Abs: 1.6 10*3/uL (ref 0.7–4.0)
MCHC: 33.1 g/dL (ref 30.0–36.0)
MCV: 81.6 fl (ref 78.0–100.0)
MONOS PCT: 10.9 % (ref 3.0–12.0)
Monocytes Absolute: 0.7 10*3/uL (ref 0.1–1.0)
NEUTROS ABS: 4.1 10*3/uL (ref 1.4–7.7)
Neutrophils Relative %: 61.7 % (ref 43.0–77.0)
PLATELETS: 298 10*3/uL (ref 150.0–400.0)
RBC: 5.56 Mil/uL (ref 4.22–5.81)
RDW: 13.5 % (ref 11.5–15.5)
WBC: 6.7 10*3/uL (ref 4.0–10.5)

## 2017-08-14 LAB — HEPATITIS C ANTIBODY
HEP C AB: NONREACTIVE
SIGNAL TO CUT-OFF: 0.01 (ref <1.00)

## 2017-08-14 LAB — HEPATIC FUNCTION PANEL
ALBUMIN: 4.5 g/dL (ref 3.5–5.2)
ALK PHOS: 56 U/L (ref 39–117)
ALT: 19 U/L (ref 0–53)
AST: 25 U/L (ref 0–37)
Bilirubin, Direct: 0.1 mg/dL (ref 0.0–0.3)
TOTAL PROTEIN: 7 g/dL (ref 6.0–8.3)
Total Bilirubin: 0.5 mg/dL (ref 0.2–1.2)

## 2017-08-14 LAB — URINALYSIS, ROUTINE W REFLEX MICROSCOPIC
Bilirubin Urine: NEGATIVE
Ketones, ur: NEGATIVE
LEUKOCYTES UA: NEGATIVE
Nitrite: NEGATIVE
Specific Gravity, Urine: 1.025 (ref 1.000–1.030)
TOTAL PROTEIN, URINE-UPE24: NEGATIVE
Urine Glucose: NEGATIVE
Urobilinogen, UA: 0.2 (ref 0.0–1.0)
pH: 6 (ref 5.0–8.0)

## 2017-08-14 LAB — BASIC METABOLIC PANEL
BUN: 19 mg/dL (ref 6–23)
CALCIUM: 9.7 mg/dL (ref 8.4–10.5)
CO2: 27 mEq/L (ref 19–32)
CREATININE: 1.49 mg/dL (ref 0.40–1.50)
Chloride: 102 mEq/L (ref 96–112)
GFR: 51.14 mL/min — ABNORMAL LOW (ref >60.00)
Glucose, Bld: 94 mg/dL (ref 70–99)
Potassium: 4.5 mEq/L (ref 3.5–5.1)
Sodium: 142 mEq/L (ref 135–145)

## 2017-08-14 LAB — LIPID PANEL
CHOL/HDL RATIO: 4
CHOLESTEROL: 197 mg/dL (ref 0–200)
HDL: 48.9 mg/dL (ref >39.00)
LDL Cholesterol: 113 mg/dL — ABNORMAL HIGH (ref 0–99)
NonHDL: 148.17
TRIGLYCERIDES: 177 mg/dL — AB (ref 0.0–149.0)
VLDL: 35.4 mg/dL (ref 0.0–40.0)

## 2017-08-14 LAB — HEMOGLOBIN A1C: Hgb A1c MFr Bld: 5.9 % (ref 4.6–6.5)

## 2017-08-14 LAB — PSA: PSA: 2.04 ng/mL (ref 0.10–4.00)

## 2017-08-14 LAB — TSH: TSH: 3.79 u[IU]/mL (ref 0.35–4.50)

## 2017-08-28 DIAGNOSIS — M5010 Cervical disc disorder with radiculopathy, unspecified cervical region: Secondary | ICD-10-CM | POA: Diagnosis not present

## 2017-08-28 DIAGNOSIS — M50823 Other cervical disc disorders at C6-C7 level: Secondary | ICD-10-CM | POA: Diagnosis not present

## 2017-08-28 DIAGNOSIS — M50822 Other cervical disc disorders at C5-C6 level: Secondary | ICD-10-CM | POA: Diagnosis not present

## 2017-09-19 DIAGNOSIS — M5412 Radiculopathy, cervical region: Secondary | ICD-10-CM | POA: Diagnosis not present

## 2017-09-19 DIAGNOSIS — M5030 Other cervical disc degeneration, unspecified cervical region: Secondary | ICD-10-CM | POA: Insufficient documentation

## 2017-09-19 DIAGNOSIS — M5033 Other cervical disc degeneration, cervicothoracic region: Secondary | ICD-10-CM | POA: Diagnosis not present

## 2017-10-17 DIAGNOSIS — C751 Malignant neoplasm of pituitary gland: Secondary | ICD-10-CM | POA: Diagnosis not present

## 2017-10-17 DIAGNOSIS — Z905 Acquired absence of kidney: Secondary | ICD-10-CM | POA: Diagnosis not present

## 2017-10-17 DIAGNOSIS — Z79899 Other long term (current) drug therapy: Secondary | ICD-10-CM | POA: Diagnosis not present

## 2017-10-17 DIAGNOSIS — C642 Malignant neoplasm of left kidney, except renal pelvis: Secondary | ICD-10-CM | POA: Diagnosis not present

## 2017-10-17 DIAGNOSIS — C779 Secondary and unspecified malignant neoplasm of lymph node, unspecified: Secondary | ICD-10-CM | POA: Diagnosis not present

## 2017-10-18 DIAGNOSIS — M5030 Other cervical disc degeneration, unspecified cervical region: Secondary | ICD-10-CM | POA: Diagnosis not present

## 2018-03-05 ENCOUNTER — Other Ambulatory Visit: Payer: Self-pay | Admitting: Internal Medicine

## 2018-03-14 ENCOUNTER — Other Ambulatory Visit: Payer: Self-pay | Admitting: Internal Medicine

## 2018-03-21 ENCOUNTER — Other Ambulatory Visit: Payer: Self-pay | Admitting: Internal Medicine

## 2018-04-10 DIAGNOSIS — C779 Secondary and unspecified malignant neoplasm of lymph node, unspecified: Secondary | ICD-10-CM | POA: Diagnosis not present

## 2018-04-10 DIAGNOSIS — C751 Malignant neoplasm of pituitary gland: Secondary | ICD-10-CM | POA: Diagnosis not present

## 2018-04-10 DIAGNOSIS — C642 Malignant neoplasm of left kidney, except renal pelvis: Secondary | ICD-10-CM | POA: Diagnosis not present

## 2018-04-10 DIAGNOSIS — Z79899 Other long term (current) drug therapy: Secondary | ICD-10-CM | POA: Diagnosis not present

## 2018-04-10 DIAGNOSIS — C649 Malignant neoplasm of unspecified kidney, except renal pelvis: Secondary | ICD-10-CM | POA: Diagnosis not present

## 2018-04-30 ENCOUNTER — Encounter: Payer: Self-pay | Admitting: Internal Medicine

## 2018-04-30 ENCOUNTER — Ambulatory Visit: Payer: 59 | Admitting: Internal Medicine

## 2018-04-30 VITALS — BP 122/78 | HR 59 | Temp 98.3°F | Ht 74.0 in | Wt 180.0 lb

## 2018-04-30 DIAGNOSIS — H9193 Unspecified hearing loss, bilateral: Secondary | ICD-10-CM | POA: Diagnosis not present

## 2018-04-30 DIAGNOSIS — R739 Hyperglycemia, unspecified: Secondary | ICD-10-CM | POA: Diagnosis not present

## 2018-04-30 DIAGNOSIS — I10 Essential (primary) hypertension: Secondary | ICD-10-CM

## 2018-04-30 NOTE — Assessment & Plan Note (Signed)
stable overall by history and exam, recent data reviewed with pt, and pt to continue medical treatment as before,  to f/u any worsening symptoms or concerns Lab Results  Component Value Date   HGBA1C 5.9 08/14/2017

## 2018-04-30 NOTE — Progress Notes (Signed)
Subjective:    Patient ID: Brandon Sanders, male    DOB: Jun 18, 1956, 62 y.o.   MRN: [id number]  HPI  Here to f/u after wife (nurse) noticed wax to ears when he c/o hearing loss left > right, without HA, fever, sinus congestion, ST, cough and Pt denies chest pain, increased sob or doe, wheezing, orthopnea, PND, increased LE swelling, palpitations, dizziness or syncope.   Pt denies polydipsia, polyuria Past Medical History:  Diagnosis Date  . Carpal tunnel syndrome, bilateral   . Chronic renal insufficiency   . COLITIS 12/17/2007  . DJD of shoulder   . Erectile dysfunction    managed with injections; no response with Viagra/Cialis  . HTN (hypertension)   . Hyperlipidemia   . PLANTAR FASCIITIS 09/05/2010  . Renal cell cancer ([place])    currently in clinical trial using Afinitor  . Solitary kidney, acquired   . SYNCOPE 09/05/2010   Past Surgical History:  Procedure Laterality Date  . NEPHRECTOMY     Right  . s.p right CTS release    . s/p right ulnar nerve release      reports that he has never smoked. He has never used smokeless tobacco. He reports that he does not drink alcohol or use drugs. family history includes Heart disease in his other; Hypertension (age of onset: 51) in his father; Liver cancer in his maternal grandmother; Stroke in his father; Transient ischemic attack in his father. Allergies  Allergen Reactions  . Penicillins    Current Outpatient Medications on File Prior to Visit  Medication Sig Dispense Refill  . amLODipine (NORVASC) 5 MG tablet TAKE 1 TABLET BY MOUTH  DAILY 90 tablet 0  . aspirin 81 MG EC tablet Take 81 mg by mouth daily.      Marland Kitchen azelastine (OPTIVAR) 0.05 % ophthalmic solution Place 1 drop into both eyes 2 (two) times daily. 6 mL 5  . everolimus (AFINITOR) 5 MG tablet Take 5 mg by mouth daily.    . fenofibrate 160 MG tablet TAKE 1 TABLET BY MOUTH  DAILY 90 tablet 0  . gabapentin (NEURONTIN) 100 MG capsule Take 1 capsule (100 mg total) by mouth 3  (three) times daily. 90 capsule 3  . ketoconazole (NIZORAL) 2 % cream APPLY TO AFFECTED AREA EVERY DAY 30 g 1  . metoprolol succinate (TOPROL-XL) 50 MG 24 hr tablet Take 1 and 1/2 tablets by  mouth every day with or  immediately following a  Meal **PHYSCIAL/YEARLY NEEDED FOR ADDITIONAL REFILLS** 135 tablet 0  . pentoxifylline (TRENTAL) 400 MG CR tablet Take 400 mg by mouth 3 (three) times daily with meals.    . pravastatin (PRAVACHOL) 40 MG tablet TAKE 1 TABLET BY MOUTH  DAILY 90 tablet 0   No current facility-administered medications on file prior to visit.    Review of Systems  Constitutional: Negative for other unusual diaphoresis or sweats HENT: Negative for ear discharge or swelling Eyes: Negative for other worsening visual disturbances Respiratory: Negative for stridor or other swelling  Gastrointestinal: Negative for worsening distension or other blood Genitourinary: Negative for retention or other urinary change Musculoskeletal: Negative for other MSK pain or swelling Skin: Negative for color change or other new lesions Neurological: Negative for worsening tremors and other numbness  Psychiatric/Behavioral: Negative for worsening agitation or other fatigue All other system neg per pt    Objective:   Physical Exam BP 122/78   Pulse (!) 59   Temp 98.3 F (36.8 C) (Oral)  Ht 6\' 2"  (1.88 m)   Wt 180 lb (81.6 kg)   SpO2 97%   BMI 23.11 kg/m  VS noted, not ill appaering Constitutional: Pt appears in NAD HENT: Head: NCAT.  Right Ear: External ear normal.  Left Ear: External ear normal.  bilat canals with left > right impactions, irrigated clear and hearing improved Eyes: . Pupils are equal, round, and reactive to light. Conjunctivae and EOM are normal Nose: without d/c or deformity Neck: Neck supple. Gross normal ROM Cardiovascular: Normal rate and regular rhythm.   Pulmonary/Chest: Effort normal and breath sounds without rales or wheezing.  Neurological: Pt is alert. At  baseline orientation, motor grossly intact Skin: Skin is warm. No rashes, other new lesions, no LE edema Psychiatric: Pt behavior is normal without agitation  No other exam findings     Assessment & Plan:

## 2018-04-30 NOTE — Patient Instructions (Addendum)
Your ears were irrigated of wax today  Please call for ENT referral if you feel your hearing is not well enough improved  Please continue all other medications as before, and refills have been done if requested.  Please have the pharmacy call with any other refills you may need.  Please continue your efforts at being more active, low cholesterol diet, and weight control.  Please keep your appointments with your specialists as you may have planned

## 2018-04-30 NOTE — Assessment & Plan Note (Signed)
Improved with bilat ear irrigation, consider ENT if have further concerns

## 2018-04-30 NOTE — Assessment & Plan Note (Signed)
stable overall by history and exam, recent data reviewed with pt, and pt to continue medical treatment as before,  to f/u any worsening symptoms or concerns BP Readings from Last 3 Encounters:  04/30/18 122/78  04/16/17 136/76  03/15/17 (!) 142/80

## 2018-05-07 ENCOUNTER — Encounter: Payer: 59 | Admitting: Internal Medicine

## 2018-05-08 DIAGNOSIS — C772 Secondary and unspecified malignant neoplasm of intra-abdominal lymph nodes: Secondary | ICD-10-CM | POA: Diagnosis not present

## 2018-05-11 ENCOUNTER — Other Ambulatory Visit: Payer: Self-pay | Admitting: Internal Medicine

## 2018-05-15 DIAGNOSIS — C772 Secondary and unspecified malignant neoplasm of intra-abdominal lymph nodes: Secondary | ICD-10-CM | POA: Diagnosis not present

## 2018-05-18 ENCOUNTER — Other Ambulatory Visit: Payer: Self-pay | Admitting: Internal Medicine

## 2018-05-26 DIAGNOSIS — C772 Secondary and unspecified malignant neoplasm of intra-abdominal lymph nodes: Secondary | ICD-10-CM | POA: Diagnosis not present

## 2018-05-27 DIAGNOSIS — C772 Secondary and unspecified malignant neoplasm of intra-abdominal lymph nodes: Secondary | ICD-10-CM | POA: Diagnosis not present

## 2018-05-28 DIAGNOSIS — C772 Secondary and unspecified malignant neoplasm of intra-abdominal lymph nodes: Secondary | ICD-10-CM | POA: Diagnosis not present

## 2018-05-29 DIAGNOSIS — C772 Secondary and unspecified malignant neoplasm of intra-abdominal lymph nodes: Secondary | ICD-10-CM | POA: Diagnosis not present

## 2018-05-30 DIAGNOSIS — C772 Secondary and unspecified malignant neoplasm of intra-abdominal lymph nodes: Secondary | ICD-10-CM | POA: Diagnosis not present

## 2018-06-02 DIAGNOSIS — C772 Secondary and unspecified malignant neoplasm of intra-abdominal lymph nodes: Secondary | ICD-10-CM | POA: Diagnosis not present

## 2018-06-03 DIAGNOSIS — C772 Secondary and unspecified malignant neoplasm of intra-abdominal lymph nodes: Secondary | ICD-10-CM | POA: Diagnosis not present

## 2018-06-04 DIAGNOSIS — C772 Secondary and unspecified malignant neoplasm of intra-abdominal lymph nodes: Secondary | ICD-10-CM | POA: Diagnosis not present

## 2018-06-05 DIAGNOSIS — C772 Secondary and unspecified malignant neoplasm of intra-abdominal lymph nodes: Secondary | ICD-10-CM | POA: Diagnosis not present

## 2018-06-06 DIAGNOSIS — C772 Secondary and unspecified malignant neoplasm of intra-abdominal lymph nodes: Secondary | ICD-10-CM | POA: Diagnosis not present

## 2018-06-09 DIAGNOSIS — C772 Secondary and unspecified malignant neoplasm of intra-abdominal lymph nodes: Secondary | ICD-10-CM | POA: Diagnosis not present

## 2018-06-18 ENCOUNTER — Telehealth: Payer: Self-pay | Admitting: Internal Medicine

## 2018-06-18 NOTE — Telephone Encounter (Signed)
Issues will be addressed during OV tomorrow. PCP might want to order additional lab work after evaluation the patient.

## 2018-06-18 NOTE — Telephone Encounter (Signed)
Copied from [place] [id number]. Topic: Inquiry >> Jun 18, 2018  8:24 AM Conception Chancy, NT wrote: Reason for CRM: patient is scheduled for an appointment tomorrow and is requesting his kidney function levels to be checked before then. He would like to know if he can get labs drawn today.

## 2018-06-19 ENCOUNTER — Ambulatory Visit: Payer: 59 | Admitting: Internal Medicine

## 2018-06-19 ENCOUNTER — Other Ambulatory Visit (INDEPENDENT_AMBULATORY_CARE_PROVIDER_SITE_OTHER): Payer: 59

## 2018-06-19 ENCOUNTER — Encounter: Payer: Self-pay | Admitting: Internal Medicine

## 2018-06-19 VITALS — BP 124/78 | HR 68 | Temp 97.7°F | Ht 74.0 in | Wt 175.0 lb

## 2018-06-19 DIAGNOSIS — R1032 Left lower quadrant pain: Secondary | ICD-10-CM | POA: Insufficient documentation

## 2018-06-19 DIAGNOSIS — Z23 Encounter for immunization: Secondary | ICD-10-CM

## 2018-06-19 DIAGNOSIS — C649 Malignant neoplasm of unspecified kidney, except renal pelvis: Secondary | ICD-10-CM

## 2018-06-19 DIAGNOSIS — J309 Allergic rhinitis, unspecified: Secondary | ICD-10-CM

## 2018-06-19 DIAGNOSIS — H6982 Other specified disorders of Eustachian tube, left ear: Secondary | ICD-10-CM

## 2018-06-19 LAB — URINALYSIS, ROUTINE W REFLEX MICROSCOPIC
BILIRUBIN URINE: NEGATIVE
Hgb urine dipstick: NEGATIVE
Ketones, ur: NEGATIVE
Leukocytes, UA: NEGATIVE
NITRITE: NEGATIVE
PH: 6 (ref 5.0–8.0)
SPECIFIC GRAVITY, URINE: 1.01 (ref 1.000–1.030)
Total Protein, Urine: NEGATIVE
URINE GLUCOSE: NEGATIVE
UROBILINOGEN UA: 0.2 (ref 0.0–1.0)

## 2018-06-19 LAB — HEPATIC FUNCTION PANEL
ALK PHOS: 50 U/L (ref 39–117)
ALT: 21 U/L (ref 0–53)
AST: 21 U/L (ref 0–37)
Albumin: 4.1 g/dL (ref 3.5–5.2)
BILIRUBIN DIRECT: 0.1 mg/dL (ref 0.0–0.3)
BILIRUBIN TOTAL: 0.4 mg/dL (ref 0.2–1.2)
Total Protein: 7.2 g/dL (ref 6.0–8.3)

## 2018-06-19 LAB — CBC WITH DIFFERENTIAL/PLATELET
BASOS ABS: 0 10*3/uL (ref 0.0–0.1)
BASOS PCT: 0.6 % (ref 0.0–3.0)
Eosinophils Absolute: 0.4 10*3/uL (ref 0.0–0.7)
Eosinophils Relative: 6.2 % — ABNORMAL HIGH (ref 0.0–5.0)
HEMATOCRIT: 46.3 % (ref 39.0–52.0)
Hemoglobin: 15.7 g/dL (ref 13.0–17.0)
LYMPHS PCT: 9 % — AB (ref 12.0–46.0)
Lymphs Abs: 0.5 10*3/uL — ABNORMAL LOW (ref 0.7–4.0)
MCHC: 33.9 g/dL (ref 30.0–36.0)
MCV: 84.7 fl (ref 78.0–100.0)
MONO ABS: 1 10*3/uL (ref 0.1–1.0)
Monocytes Relative: 16.1 % — ABNORMAL HIGH (ref 3.0–12.0)
NEUTROS ABS: 4.2 10*3/uL (ref 1.4–7.7)
Neutrophils Relative %: 68.1 % (ref 43.0–77.0)
PLATELETS: 222 10*3/uL (ref 150.0–400.0)
RBC: 5.46 Mil/uL (ref 4.22–5.81)
RDW: 17.8 % — AB (ref 11.5–15.5)
WBC: 6.1 10*3/uL (ref 4.0–10.5)

## 2018-06-19 LAB — BASIC METABOLIC PANEL
BUN: 17 mg/dL (ref 6–23)
CHLORIDE: 102 meq/L (ref 96–112)
CO2: 28 meq/L (ref 19–32)
CREATININE: 1.28 mg/dL (ref 0.40–1.50)
Calcium: 10 mg/dL (ref 8.4–10.5)
GFR: 60.77 mL/min (ref >60.00)
GLUCOSE: 101 mg/dL — AB (ref 70–99)
POTASSIUM: 4.3 meq/L (ref 3.5–5.1)
Sodium: 138 mEq/L (ref 135–145)

## 2018-06-19 LAB — LIPASE: LIPASE: 25 U/L (ref 11.0–59.0)

## 2018-06-19 MED ORDER — FLUTICASONE PROPIONATE 50 MCG/ACT NA SUSP: 2.0000 | Freq: Every day | NASAL | 2 refills | Status: DC

## 2018-06-19 MED ORDER — LORATADINE 10 MG PO TABS: 10.0000 mg | ORAL_TABLET | Freq: Every day | ORAL | 3 refills | Status: DC

## 2018-06-19 NOTE — Progress Notes (Signed)
Subjective:    Patient ID: Brandon Sanders, male    DOB: [birth year], 62 y.o.   MRN: [id number]  HPI  Here with c/o LLQ and left groin and left lateral hip area sharp and dull pain for 1 wk, mild, intermittent but worse and woke him up last night, and then today much improved by time of this visit by about "75%";  Took asa 81 mg x 6 , and standing and walking seems to actually make it better.  No hx of renal stones and Denies urinary symptoms such as dysuria, frequency, urgency, flank pain, hematuria or n/v, fever, chills.  Denies worsening reflux, abd pain, dysphagia, n/v, bowel change or blood, though did have some loose stool yesterday.  Is s/p recent XRT to right chest and RUQ per pt for renal cell recurrence, and now off Affinity.  Last labs here dec 2018.  No hernia swelling, denies fever or feeling sick, just difficult for him to believe this is MSK as never had before.  Last MRI pelvis as below.  Incidentally also - Does have several wks ongoing nasal allergy symptoms with clearish congestion, itch and sneezing, without fever, pain, ST, cough, swelling or wheezing. Past Medical History:  Diagnosis Date  . Carpal tunnel syndrome, bilateral   . Chronic renal insufficiency   . COLITIS 12/17/2007  . DJD of shoulder   . Erectile dysfunction    managed with injections; no response with Viagra/Cialis  . HTN (hypertension)   . Hyperlipidemia   . PLANTAR FASCIITIS 09/05/2010  . Renal cell cancer ([place])    currently in clinical trial using Afinitor  . Solitary kidney, acquired   . SYNCOPE 09/05/2010   Past Surgical History:  Procedure Laterality Date  . NEPHRECTOMY     Right  . s.p right CTS release    . s/p right ulnar nerve release      reports that he has never smoked. He has never used smokeless tobacco. He reports that he does not drink alcohol or use drugs. family history includes Heart disease in his other; Hypertension (age of onset: 29) in his father; Liver cancer in his maternal  grandmother; Stroke in his father; Transient ischemic attack in his father. Allergies  Allergen Reactions  . Penicillins    Current Outpatient Medications on File Prior to Visit  Medication Sig Dispense Refill  . amLODipine (NORVASC) 5 MG tablet TAKE 1 TABLET BY MOUTH  DAILY 90 tablet 2  . aspirin 81 MG EC tablet Take 81 mg by mouth daily.      Marland Kitchen azelastine (OPTIVAR) 0.05 % ophthalmic solution Place 1 drop into both eyes 2 (two) times daily. 6 mL 5  . fenofibrate 160 MG tablet TAKE 1 TABLET BY MOUTH  DAILY 90 tablet 2  . ketoconazole (NIZORAL) 2 % cream APPLY TO AFFECTED AREA EVERY DAY 30 g 1  . metoprolol succinate (TOPROL-XL) 50 MG 24 hr tablet TAKE 1 AND 1/2 TABLETS BY  MOUTH EVERY DAY WITH OR  IMMEDIATELY FOLLOWING A  MEAL PHYSCIAL/YEARLY  NEEDED FOR ADDITIONAL  REFILLS 135 tablet 0  . pravastatin (PRAVACHOL) 40 MG tablet TAKE 1 TABLET BY MOUTH  DAILY 90 tablet 2   No current facility-administered medications on file prior to visit.    Review of Systems  Constitutional: Negative for other unusual diaphoresis or sweats HENT: Negative for ear discharge or swelling Eyes: Negative for other worsening visual disturbances Respiratory: Negative for stridor or other swelling  Gastrointestinal: Negative for worsening distension or  other blood Genitourinary: Negative for retention or other urinary change Musculoskeletal: Negative for other MSK pain or swelling Skin: Negative for color change or other new lesions Neurological: Negative for worsening tremors and other numbness  Psychiatric/Behavioral: Negative for worsening agitation or other fatigue All other system neg per pt    Objective:   Physical Exam BP 124/78   Pulse 68   Temp 97.7 F (36.5 C) (Oral)   Ht 6\' 2"  (1.88 m)   Wt 175 lb (79.4 kg)   SpO2 98%   BMI 22.47 kg/m  VS noted,  Constitutional: Pt appears in NAD HENT: Head: NCAT.  Right Ear: External ear normal.  Left Ear: External ear normal.  Eyes: . Pupils are  equal, round, and reactive to light. Conjunctivae and EOM are normal Nose: without d/c or deformity Neck: Neck supple. Gross normal ROM Cardiovascular: Normal rate and regular rhythm.   Pulmonary/Chest: Effort normal and breath sounds without rales or wheezing.  Abd:  Soft, NT, ND, + BS, no organomegaly Neurological: Pt is alert. At baseline orientation, motor grossly intact Skin: Skin is warm. No rashes, other new lesions, no LE edema Psychiatric: Pt behavior is normal without agitation  MSK: nontender groin or greater trochanter area, no pain and FROM with left hip flexion and int/ext rotation  MRi pelvis 04-10-2018 at [facility] - summary only Impression: 1. No evidence of local recurrence in the right nephrectomy bed. 2. Continued interval increase in right retrocrural and left periaortic lymph nodes.      Assessment & Plan:

## 2018-06-19 NOTE — Patient Instructions (Signed)
Please take all new medication as prescribed  - the claritin and flonase  You can also take Mucinex (or it's generic off brand) for congestion, and tylenol as needed for pain.  Please continue all other medications as before, and refills have been done if requested.  Please have the pharmacy call with any other refills you may need.  Please keep your appointments with your specialists as you may have planned  Please go to the LAB in the Basement (turn left off the elevator) for the tests to be done today  You will be contacted by phone if any changes need to be made immediately.  Otherwise, you will receive a letter about your results with an explanation, but please check with MyChart first.  Please remember to sign up for MyChart if you have not done so, as this will be important to you in the future with finding out test results, communicating by private email, and scheduling acute appointments online when needed.

## 2018-06-21 NOTE — Assessment & Plan Note (Signed)
Mild to mod, for claritin and flonase asd,  to f/u any worsening symptoms or concerns

## 2018-06-21 NOTE — Assessment & Plan Note (Signed)
Ok for [employer] prn

## 2018-06-21 NOTE — Assessment & Plan Note (Signed)
Etiology unclear, suspect MSK though exam benign, for UA and labs as ordered, tx pending results, ok for tylenol prn

## 2018-06-21 NOTE — Assessment & Plan Note (Signed)
For f/u [facility] as planned, has MRI f/u every 6 mo

## 2018-07-31 DIAGNOSIS — Z85528 Personal history of other malignant neoplasm of kidney: Secondary | ICD-10-CM | POA: Diagnosis not present

## 2018-08-04 ENCOUNTER — Other Ambulatory Visit: Payer: Self-pay | Admitting: Internal Medicine

## 2018-08-11 DIAGNOSIS — C649 Malignant neoplasm of unspecified kidney, except renal pelvis: Secondary | ICD-10-CM | POA: Diagnosis not present

## 2018-08-11 DIAGNOSIS — C779 Secondary and unspecified malignant neoplasm of lymph node, unspecified: Secondary | ICD-10-CM | POA: Diagnosis not present

## 2018-08-11 DIAGNOSIS — N182 Chronic kidney disease, stage 2 (mild): Secondary | ICD-10-CM | POA: Diagnosis not present

## 2018-08-11 DIAGNOSIS — C642 Malignant neoplasm of left kidney, except renal pelvis: Secondary | ICD-10-CM | POA: Diagnosis not present

## 2018-08-11 DIAGNOSIS — C751 Malignant neoplasm of pituitary gland: Secondary | ICD-10-CM | POA: Diagnosis not present

## 2018-08-11 DIAGNOSIS — Z79899 Other long term (current) drug therapy: Secondary | ICD-10-CM | POA: Diagnosis not present

## 2018-09-25 ENCOUNTER — Telehealth: Payer: Self-pay

## 2018-09-25 DIAGNOSIS — Z0000 Encounter for general adult medical examination without abnormal findings: Secondary | ICD-10-CM

## 2018-09-25 NOTE — Telephone Encounter (Signed)
Orders have been entered.  Copied from [place] [id number] [id number]. Topic: General - Other >> Sep 25, 2018  8:27 AM Carolyn Stare wrote:  Pt has a CPE scheduled 10/17/2018 and is asking to have labs done before the appt

## 2018-10-10 ENCOUNTER — Other Ambulatory Visit (INDEPENDENT_AMBULATORY_CARE_PROVIDER_SITE_OTHER): Payer: 59

## 2018-10-10 DIAGNOSIS — Z0000 Encounter for general adult medical examination without abnormal findings: Secondary | ICD-10-CM

## 2018-10-10 LAB — URINALYSIS, ROUTINE W REFLEX MICROSCOPIC
BILIRUBIN URINE: NEGATIVE
Hgb urine dipstick: NEGATIVE
Ketones, ur: NEGATIVE
Leukocytes, UA: NEGATIVE
Nitrite: NEGATIVE
RBC / HPF: NONE SEEN (ref 0–?)
Total Protein, Urine: NEGATIVE
UROBILINOGEN UA: 0.2 (ref 0.0–1.0)
Urine Glucose: NEGATIVE
pH: 5.5 (ref 5.0–8.0)

## 2018-10-10 LAB — CBC WITH DIFFERENTIAL/PLATELET
BASOS ABS: 0.1 10*3/uL (ref 0.0–0.1)
Basophils Relative: 0.9 % (ref 0.0–3.0)
EOS ABS: 0.1 10*3/uL (ref 0.0–0.7)
Eosinophils Relative: 2.3 % (ref 0.0–5.0)
HCT: 46.5 % (ref 39.0–52.0)
Hemoglobin: 15.7 g/dL (ref 13.0–17.0)
LYMPHS ABS: 1.6 10*3/uL (ref 0.7–4.0)
Lymphocytes Relative: 28.9 % (ref 12.0–46.0)
MCHC: 33.8 g/dL (ref 30.0–36.0)
MCV: 90.4 fl (ref 78.0–100.0)
MONO ABS: 0.7 10*3/uL (ref 0.1–1.0)
MONOS PCT: 12.4 % — AB (ref 3.0–12.0)
NEUTROS PCT: 55.5 % (ref 43.0–77.0)
Neutro Abs: 3.1 10*3/uL (ref 1.4–7.7)
PLATELETS: 248 10*3/uL (ref 150.0–400.0)
RBC: 5.15 Mil/uL (ref 4.22–5.81)
RDW: 13.1 % (ref 11.5–15.5)
WBC: 5.7 10*3/uL (ref 4.0–10.5)

## 2018-10-10 LAB — LIPID PANEL
CHOL/HDL RATIO: 4
CHOLESTEROL: 149 mg/dL (ref 0–200)
HDL: 36.8 mg/dL — AB (ref >39.00)
LDL Cholesterol: 89 mg/dL (ref 0–99)
NonHDL: 112.55
TRIGLYCERIDES: 116 mg/dL (ref 0.0–149.0)
VLDL: 23.2 mg/dL (ref 0.0–40.0)

## 2018-10-10 LAB — BASIC METABOLIC PANEL
BUN: 24 mg/dL — ABNORMAL HIGH (ref 6–23)
CHLORIDE: 100 meq/L (ref 96–112)
CO2: 29 meq/L (ref 19–32)
Calcium: 10.3 mg/dL (ref 8.4–10.5)
Creatinine, Ser: 1.48 mg/dL (ref 0.40–1.50)
GFR: 48.31 mL/min — ABNORMAL LOW (ref >60.00)
Glucose, Bld: 88 mg/dL (ref 70–99)
POTASSIUM: 4.2 meq/L (ref 3.5–5.1)
SODIUM: 137 meq/L (ref 135–145)

## 2018-10-10 LAB — HEPATIC FUNCTION PANEL
ALBUMIN: 4.2 g/dL (ref 3.5–5.2)
ALK PHOS: 62 U/L (ref 39–117)
ALT: 16 U/L (ref 0–53)
AST: 19 U/L (ref 0–37)
Bilirubin, Direct: 0.1 mg/dL (ref 0.0–0.3)
TOTAL PROTEIN: 6.7 g/dL (ref 6.0–8.3)
Total Bilirubin: 0.5 mg/dL (ref 0.2–1.2)

## 2018-10-10 LAB — TSH: TSH: 4.47 u[IU]/mL (ref 0.35–4.50)

## 2018-10-10 LAB — PSA: PSA: 1.38 ng/mL (ref 0.10–4.00)

## 2018-10-17 ENCOUNTER — Encounter: Payer: Self-pay | Admitting: Internal Medicine

## 2018-10-17 ENCOUNTER — Ambulatory Visit (INDEPENDENT_AMBULATORY_CARE_PROVIDER_SITE_OTHER): Payer: 59 | Admitting: Internal Medicine

## 2018-10-17 VITALS — BP 116/74 | HR 68 | Temp 97.7°F | Ht 74.0 in | Wt 176.0 lb

## 2018-10-17 DIAGNOSIS — Z0000 Encounter for general adult medical examination without abnormal findings: Secondary | ICD-10-CM

## 2018-10-17 DIAGNOSIS — H1013 Acute atopic conjunctivitis, bilateral: Secondary | ICD-10-CM

## 2018-10-17 DIAGNOSIS — J309 Allergic rhinitis, unspecified: Secondary | ICD-10-CM | POA: Diagnosis not present

## 2018-10-17 DIAGNOSIS — R739 Hyperglycemia, unspecified: Secondary | ICD-10-CM

## 2018-10-17 DIAGNOSIS — Z0001 Encounter for general adult medical examination with abnormal findings: Secondary | ICD-10-CM

## 2018-10-17 DIAGNOSIS — I493 Ventricular premature depolarization: Secondary | ICD-10-CM | POA: Diagnosis not present

## 2018-10-17 DIAGNOSIS — I10 Essential (primary) hypertension: Secondary | ICD-10-CM

## 2018-10-17 MED ORDER — METHYLPREDNISOLONE ACETATE 80 MG/ML IJ SUSP
80.0000 mg | Freq: Once | INTRAMUSCULAR | Status: AC
  Administered 2018-10-17: 80 mg via INTRAMUSCULAR

## 2018-10-17 MED ORDER — AZELASTINE HCL 0.05 % OP SOLN: 1.0000 [drp] | Freq: Two times a day (BID) | OPHTHALMIC | 5 refills | Status: DC

## 2018-10-17 MED ORDER — FLUTICASONE PROPIONATE 50 MCG/ACT NA SUSP: 2.0000 | Freq: Every day | NASAL | 2 refills | Status: DC

## 2018-10-17 MED ORDER — FENOFIBRATE 160 MG PO TABS: 160.0000 mg | ORAL_TABLET | Freq: Every day | ORAL | 3 refills | Status: DC

## 2018-10-17 MED ORDER — METOPROLOL SUCCINATE ER 50 MG PO TB24: ORAL_TABLET | ORAL | 3 refills | Status: DC

## 2018-10-17 MED ORDER — PRAVASTATIN SODIUM 40 MG PO TABS: 40.0000 mg | ORAL_TABLET | Freq: Every day | ORAL | 3 refills | Status: DC

## 2018-10-17 MED ORDER — LORATADINE 10 MG PO TABS: 10.0000 mg | ORAL_TABLET | Freq: Every day | ORAL | 3 refills | Status: DC

## 2018-10-17 MED ORDER — AMLODIPINE BESYLATE 5 MG PO TABS: 5.0000 mg | ORAL_TABLET | Freq: Every day | ORAL | 3 refills | Status: DC

## 2018-10-17 MED ORDER — KETOCONAZOLE 2 % EX CREA: TOPICAL_CREAM | CUTANEOUS | 2 refills | Status: DC

## 2018-10-17 NOTE — Assessment & Plan Note (Signed)
stable overall by history and exam, recent data reviewed with pt, and pt to continue medical treatment as before,  to f/u any worsening symptoms or concerns  

## 2018-10-17 NOTE — Patient Instructions (Addendum)
You will be contacted regarding the referral for: colonoscopy  You had the steroid shot today  Please continue all other medications as before, including the claritin and flonase  Please have the pharmacy call with any other refills you may need.  Please continue your efforts at being more active, low cholesterol diet, and weight control.  You are otherwise up to date with prevention measures today.  Please keep your appointments with your specialists as you may have planned  You will be contacted regarding the referral for: colonoscopy  Please return in 1 year for your yearly visit, or sooner if needed, with Lab testing done 3-5 days before

## 2018-10-17 NOTE — Assessment & Plan Note (Signed)

## 2018-10-17 NOTE — Progress Notes (Signed)
Subjective:    Patient ID: Brandon Sanders, male    DOB: May 27, 1956, 63 y.o.   MRN: [id number]  HPI  Here for wellness and f/u;  Overall doing ok;  Pt denies Chest pain, worsening SOB, DOE, wheezing, orthopnea, PND, worsening LE edema, dizziness or syncope.  Pt denies neurological change such as new headache, facial or extremity weakness.  Pt denies polydipsia, polyuria, or low sugar symptoms. Pt states overall good compliance with treatment and medications, good tolerability, and has been trying to follow appropriate diet.  Pt denies worsening depressive symptoms, suicidal ideation or panic. No fever, night sweats, wt loss, loss of appetite, or other constitutional symptoms.  Pt states good ability with ADL's, has low fall risk, home safety reviewed and adequate, no other significant changes in hearing or vision, and only occasionally active with exercise. Stopped the Affinitor, with all blood work back to 2012 levels per pt atter graphing at home. Does have several wks ongoing nasal allergy symptoms with clearish congestion, itch and sneezing, without fever, pain, ST, cough, swelling or wheezing.  Has had more palpitations symptomatic PVC recently but o/w tolerable.   Past Medical History:  Diagnosis Date  . Carpal tunnel syndrome, bilateral   . Chronic renal insufficiency   . COLITIS 12/17/2007  . DJD of shoulder   . Erectile dysfunction    managed with injections; no response with Viagra/Cialis  . HTN (hypertension)   . Hyperlipidemia   . PLANTAR FASCIITIS 09/05/2010  . Renal cell cancer ([place])    currently in clinical trial using Afinitor  . Solitary kidney, acquired   . SYNCOPE 09/05/2010   Past Surgical History:  Procedure Laterality Date  . NEPHRECTOMY     Right  . s.p right CTS release    . s/p right ulnar nerve release      reports that he has never smoked. He has never used smokeless tobacco. He reports that he does not drink alcohol or use drugs. family history includes Heart  disease in an other family member; Hypertension (age of onset: 17) in his father; Liver cancer in his maternal grandmother; Stroke in his father; Transient ischemic attack in his father. Allergies  Allergen Reactions  . Penicillins    Current Outpatient Medications on File Prior to Visit  Medication Sig Dispense Refill  . aspirin 81 MG EC tablet Take 81 mg by mouth daily.       No current facility-administered medications on file prior to visit.    Review of Systems Constitutional: Negative for other unusual diaphoresis, sweats, appetite or weight changes HENT: Negative for other worsening hearing loss, ear pain, facial swelling, mouth sores or neck stiffness.   Eyes: Negative for other worsening pain, redness or other visual disturbance.  Respiratory: Negative for other stridor or swelling Cardiovascular: Negative for other palpitations or other chest pain  Gastrointestinal: Negative for worsening diarrhea or loose stools, blood in stool, distention or other pain Genitourinary: Negative for hematuria, flank pain or other change in urine volume.  Musculoskeletal: Negative for myalgias or other joint swelling.  Skin: Negative for other color change, or other wound or worsening drainage.  Neurological: Negative for other syncope or numbness. Hematological: Negative for other adenopathy or swelling Psychiatric/Behavioral: Negative for hallucinations, other worsening agitation, SI, self-injury, or new decreased concentration All other system neg per pt    Objective:   Physical Exam BP 116/74   Pulse 68   Temp 97.7 F (36.5 C) (Oral)   Ht 6\' 2"  (1.88  m)   Wt 176 lb (79.8 kg)   SpO2 96%   BMI 22.60 kg/m  VS noted,  Constitutional: Pt is oriented to person, place, and time. Appears well-developed and well-nourished, in no significant distress and comfortable Head: Normocephalic and atraumatic  Eyes: Conjunctivae and EOM are normal. Pupils are equal, round, and reactive to  light Right Ear: External ear normal without discharge Left Ear: External ear normal without discharge Nose: Nose without discharge or deformity Bilat tm's with mild erythema.  Max sinus areas non tender.  Pharynx with mild erythema, no exudate Mouth/Throat: Oropharynx is without other ulcerations and moist  Neck: Normal range of motion. Neck supple. No JVD present. No tracheal deviation present or significant neck LA or mass Cardiovascular: Normal rate, regular rhythm, normal heart sounds and intact distal pulses.   Pulmonary/Chest: WOB normal and breath sounds without rales or wheezing  Abdominal: Soft. Bowel sounds are normal. NT. No HSM  Musculoskeletal: Normal range of motion. Exhibits no edema Lymphadenopathy: Has no other cervical adenopathy.  Neurological: Pt is alert and oriented to person, place, and time. Pt has normal reflexes. No cranial nerve deficit. Motor grossly intact, Gait intact Skin: Skin is warm and dry. No rash noted or new ulcerations Psychiatric:  Has normal mood and affect. Behavior is normal without agitation No other exam findings Lab Results  Component Value Date   WBC 5.7 10/10/2018   HGB 15.7 10/10/2018   HCT 46.5 10/10/2018   PLT 248.0 10/10/2018   GLUCOSE 88 10/10/2018   CHOL 149 10/10/2018   TRIG 116.0 10/10/2018   HDL 36.80 (L) 10/10/2018   LDLDIRECT 90.2 10/27/2009   LDLCALC 89 10/10/2018   ALT 16 10/10/2018   AST 19 10/10/2018   NA 137 10/10/2018   K 4.2 10/10/2018   CL 100 10/10/2018   CREATININE 1.48 10/10/2018   BUN 24 (H) 10/10/2018   CO2 29 10/10/2018   TSH 4.47 10/10/2018   PSA 1.38 10/10/2018   INR 0.92 02/16/2011   HGBA1C 5.9 08/14/2017   MICROALBUR 1.1 04/05/2016        Assessment & Plan:

## 2018-10-17 NOTE — Assessment & Plan Note (Signed)
Some increased frequency but o/w stable, cont BB

## 2018-10-17 NOTE — Assessment & Plan Note (Signed)
Mild to mod, for depomedrol IM 80, restart flonase and claritin,  to f/u any worsening symptoms or concerns

## 2018-11-13 DIAGNOSIS — C642 Malignant neoplasm of left kidney, except renal pelvis: Secondary | ICD-10-CM | POA: Diagnosis not present

## 2018-11-13 DIAGNOSIS — C751 Malignant neoplasm of pituitary gland: Secondary | ICD-10-CM | POA: Diagnosis not present

## 2018-11-13 DIAGNOSIS — C649 Malignant neoplasm of unspecified kidney, except renal pelvis: Secondary | ICD-10-CM | POA: Diagnosis not present

## 2018-11-13 DIAGNOSIS — C772 Secondary and unspecified malignant neoplasm of intra-abdominal lymph nodes: Secondary | ICD-10-CM | POA: Diagnosis not present

## 2018-11-13 DIAGNOSIS — Z79899 Other long term (current) drug therapy: Secondary | ICD-10-CM | POA: Diagnosis not present

## 2019-05-21 DIAGNOSIS — N486 Induration penis plastica: Secondary | ICD-10-CM | POA: Insufficient documentation

## 2019-07-28 ENCOUNTER — Other Ambulatory Visit: Payer: Self-pay | Admitting: Internal Medicine

## 2019-09-20 ENCOUNTER — Other Ambulatory Visit: Payer: Self-pay | Admitting: Internal Medicine

## 2019-09-20 NOTE — Telephone Encounter (Signed)
Please refill as per office routine med refill policy (all routine meds refilled for 3 mo or monthly per pt preference up to one year from last visit, then month to month grace period for 3 mo, then further med refills will have to be denied)  

## 2019-10-21 ENCOUNTER — Other Ambulatory Visit (INDEPENDENT_AMBULATORY_CARE_PROVIDER_SITE_OTHER): Payer: 59

## 2019-10-21 DIAGNOSIS — R739 Hyperglycemia, unspecified: Secondary | ICD-10-CM | POA: Diagnosis not present

## 2019-10-21 DIAGNOSIS — Z0001 Encounter for general adult medical examination with abnormal findings: Secondary | ICD-10-CM | POA: Diagnosis not present

## 2019-10-21 LAB — URINALYSIS, ROUTINE W REFLEX MICROSCOPIC
Bilirubin Urine: NEGATIVE
Hgb urine dipstick: NEGATIVE
Ketones, ur: NEGATIVE
Leukocytes,Ua: NEGATIVE
Nitrite: NEGATIVE
RBC / HPF: NONE SEEN (ref 0–?)
Specific Gravity, Urine: 1.025 (ref 1.000–1.030)
Total Protein, Urine: NEGATIVE
Urine Glucose: NEGATIVE
Urobilinogen, UA: 0.2 (ref 0.0–1.0)
pH: 6 (ref 5.0–8.0)

## 2019-10-21 LAB — BASIC METABOLIC PANEL
BUN: 22 mg/dL (ref 6–23)
CO2: 32 mEq/L (ref 19–32)
Calcium: 9.5 mg/dL (ref 8.4–10.5)
Chloride: 101 mEq/L (ref 96–112)
Creatinine, Ser: 1.48 mg/dL (ref 0.40–1.50)
GFR: 48.14 mL/min — ABNORMAL LOW (ref >60.00)
Glucose, Bld: 93 mg/dL (ref 70–99)
Potassium: 4.1 mEq/L (ref 3.5–5.1)
Sodium: 137 mEq/L (ref 135–145)

## 2019-10-21 LAB — PSA: PSA: 1.49 ng/mL (ref 0.10–4.00)

## 2019-10-21 LAB — CBC WITH DIFFERENTIAL/PLATELET
Basophils Absolute: 0 10*3/uL (ref 0.0–0.1)
Basophils Relative: 0.8 % (ref 0.0–3.0)
Eosinophils Absolute: 0.1 10*3/uL (ref 0.0–0.7)
Eosinophils Relative: 1.7 % (ref 0.0–5.0)
HCT: 43.9 % (ref 39.0–52.0)
Hemoglobin: 14.9 g/dL (ref 13.0–17.0)
Lymphocytes Relative: 23 % (ref 12.0–46.0)
Lymphs Abs: 1.3 10*3/uL (ref 0.7–4.0)
MCHC: 33.8 g/dL (ref 30.0–36.0)
MCV: 90.1 fl (ref 78.0–100.0)
Monocytes Absolute: 0.6 10*3/uL (ref 0.1–1.0)
Monocytes Relative: 10.5 % (ref 3.0–12.0)
Neutro Abs: 3.5 10*3/uL (ref 1.4–7.7)
Neutrophils Relative %: 64 % (ref 43.0–77.0)
Platelets: 249 10*3/uL (ref 150.0–400.0)
RBC: 4.87 Mil/uL (ref 4.22–5.81)
RDW: 13.3 % (ref 11.5–15.5)
WBC: 5.5 10*3/uL (ref 4.0–10.5)

## 2019-10-21 LAB — HEMOGLOBIN A1C: Hgb A1c MFr Bld: 5.9 % (ref 4.6–6.5)

## 2019-10-21 LAB — HEPATIC FUNCTION PANEL
ALT: 18 U/L (ref 0–53)
AST: 21 U/L (ref 0–37)
Albumin: 4.1 g/dL (ref 3.5–5.2)
Alkaline Phosphatase: 64 U/L (ref 39–117)
Bilirubin, Direct: 0.1 mg/dL (ref 0.0–0.3)
Total Bilirubin: 0.5 mg/dL (ref 0.2–1.2)
Total Protein: 6.6 g/dL (ref 6.0–8.3)

## 2019-10-21 LAB — LIPID PANEL
Cholesterol: 160 mg/dL (ref 0–200)
HDL: 45.2 mg/dL (ref >39.00)
LDL Cholesterol: 96 mg/dL (ref 0–99)
NonHDL: 114.72
Total CHOL/HDL Ratio: 4
Triglycerides: 93 mg/dL (ref 0.0–149.0)
VLDL: 18.6 mg/dL (ref 0.0–40.0)

## 2019-10-21 LAB — TSH: TSH: 3.67 u[IU]/mL (ref 0.35–4.50)

## 2019-10-28 ENCOUNTER — Ambulatory Visit (INDEPENDENT_AMBULATORY_CARE_PROVIDER_SITE_OTHER): Payer: 59 | Admitting: Internal Medicine

## 2019-10-28 ENCOUNTER — Encounter: Payer: Self-pay | Admitting: Internal Medicine

## 2019-10-28 ENCOUNTER — Other Ambulatory Visit: Payer: Self-pay

## 2019-10-28 VITALS — BP 130/62 | HR 60 | Temp 97.9°F | Ht 74.0 in | Wt 183.8 lb

## 2019-10-28 DIAGNOSIS — L301 Dyshidrosis [pompholyx]: Secondary | ICD-10-CM

## 2019-10-28 DIAGNOSIS — N289 Disorder of kidney and ureter, unspecified: Secondary | ICD-10-CM

## 2019-10-28 DIAGNOSIS — E538 Deficiency of other specified B group vitamins: Secondary | ICD-10-CM

## 2019-10-28 DIAGNOSIS — E611 Iron deficiency: Secondary | ICD-10-CM

## 2019-10-28 DIAGNOSIS — J309 Allergic rhinitis, unspecified: Secondary | ICD-10-CM | POA: Diagnosis not present

## 2019-10-28 DIAGNOSIS — Z0001 Encounter for general adult medical examination with abnormal findings: Secondary | ICD-10-CM

## 2019-10-28 DIAGNOSIS — Z5320 Procedure and treatment not carried out because of patient's decision for unspecified reasons: Secondary | ICD-10-CM | POA: Diagnosis not present

## 2019-10-28 DIAGNOSIS — R739 Hyperglycemia, unspecified: Secondary | ICD-10-CM

## 2019-10-28 DIAGNOSIS — H1013 Acute atopic conjunctivitis, bilateral: Secondary | ICD-10-CM

## 2019-10-28 DIAGNOSIS — E7800 Pure hypercholesterolemia, unspecified: Secondary | ICD-10-CM

## 2019-10-28 DIAGNOSIS — Z0000 Encounter for general adult medical examination without abnormal findings: Secondary | ICD-10-CM

## 2019-10-28 DIAGNOSIS — Z114 Encounter for screening for human immunodeficiency virus [HIV]: Secondary | ICD-10-CM | POA: Diagnosis not present

## 2019-10-28 DIAGNOSIS — E559 Vitamin D deficiency, unspecified: Secondary | ICD-10-CM

## 2019-10-28 MED ORDER — TRIAMCINOLONE ACETONIDE 0.1 % EX CREA: 1.0000 "application " | TOPICAL_CREAM | Freq: Two times a day (BID) | CUTANEOUS | 3 refills | Status: AC

## 2019-10-28 MED ORDER — AZELASTINE-FLUTICASONE 137-50 MCG/ACT NA SUSP: NASAL | 3 refills | Status: DC

## 2019-10-28 MED ORDER — FENOFIBRATE 160 MG PO TABS: 160.0000 mg | ORAL_TABLET | Freq: Every day | ORAL | 3 refills | Status: DC

## 2019-10-28 MED ORDER — AZELASTINE HCL 0.05 % OP SOLN: 1.0000 [drp] | Freq: Two times a day (BID) | OPHTHALMIC | 5 refills | Status: DC

## 2019-10-28 MED ORDER — AMLODIPINE BESYLATE 5 MG PO TABS: 5.0000 mg | ORAL_TABLET | Freq: Every day | ORAL | 3 refills | Status: DC

## 2019-10-28 MED ORDER — PRAVASTATIN SODIUM 40 MG PO TABS: 40.0000 mg | ORAL_TABLET | Freq: Every day | ORAL | 3 refills | Status: DC

## 2019-10-28 MED ORDER — METOPROLOL SUCCINATE ER 50 MG PO TB24: ORAL_TABLET | ORAL | 3 refills | Status: DC

## 2019-10-28 NOTE — Assessment & Plan Note (Signed)

## 2019-10-28 NOTE — Progress Notes (Signed)
Subjective:    Patient ID: Brandon Sanders, male    DOB: [birth year], 64 y.o.   MRN: [id number]  HPI  Here for wellness and f/u;  Overall doing ok;  Pt denies Chest pain, worsening SOB, DOE, wheezing, orthopnea, PND, worsening LE edema, palpitations, dizziness or syncope.  Pt denies neurological change such as new headache, facial or extremity weakness.  Pt denies polydipsia, polyuria, or low sugar symptoms. Pt states overall good compliance with treatment and medications, good tolerability, and has been trying to follow appropriate diet.  Pt denies worsening depressive symptoms, suicidal ideation or panic. No fever, night sweats, wt loss, loss of appetite, or other constitutional symptoms.  Pt states good ability with ADL's, has low fall risk, home safety reviewed and adequate, no other significant changes in hearing or vision, and only occasionally active with exercise. Also with c/o dry skin cracking skin to the hands not better with moisturizer for several months.  Does have several wks ongoing nasal allergy symptoms with clearish congestion, itch and sneezing, without fever, pain, ST, cough, swelling or wheezing, not well controlled with flonase and has significant persistent post nasal gtt.   Past Medical History:  Diagnosis Date  . Carpal tunnel syndrome, bilateral   . Chronic renal insufficiency   . COLITIS 12/17/2007  . DJD of shoulder   . Erectile dysfunction    managed with injections; no response with Viagra/Cialis  . HTN (hypertension)   . Hyperlipidemia   . PLANTAR FASCIITIS 09/05/2010  . Renal cell cancer ([place])    currently in clinical trial using Afinitor  . Solitary kidney, acquired   . SYNCOPE 09/05/2010   Past Surgical History:  Procedure Laterality Date  . NEPHRECTOMY     Right  . s.p right CTS release    . s/p right ulnar nerve release      reports that he has never smoked. He has never used smokeless tobacco. He reports that he does not drink alcohol or use drugs.  family history includes Heart disease in an other family member; Hypertension (age of onset: 91) in his father; Liver cancer in his maternal grandmother; Stroke in his father; Transient ischemic attack in his father. Allergies  Allergen Reactions  . Penicillins    Current Outpatient Medications on File Prior to Visit  Medication Sig Dispense Refill  . aspirin 81 MG EC tablet Take 81 mg by mouth daily.      . fluticasone (FLONASE) 50 MCG/ACT nasal spray USE 2 SPRAYS IN EACH  NOSTRIL DAILY 48 g 2  . ketoconazole (NIZORAL) 2 % cream APPLY TO AFFECTED AREA EVERY DAY 30 g 2  . loratadine (CLARITIN) 10 MG tablet Take 1 tablet (10 mg total) by mouth daily. 90 tablet 3   No current facility-administered medications on file prior to visit.   Review of Systems All otherwise neg per pt     Objective:   Physical Exam BP 130/62   Pulse 60   Temp 97.9 F (36.6 C)   Ht 6\' 2"  (1.88 m)   Wt 183 lb 12.8 oz (83.4 kg)   SpO2 99%   BMI 23.60 kg/m  VS noted,  Constitutional: Pt appears in NAD HENT: Head: NCAT.  Right Ear: External ear normal.  Left Ear: External ear normal.  Eyes: . Pupils are equal, round, and reactive to light. Conjunctivae and EOM are normal Nose: without d/c or deformity Neck: Neck supple. Gross normal ROM Cardiovascular: Normal rate and regular rhythm.   Pulmonary/Chest: Effort  normal and breath sounds without rales or wheezing.  Abd:  Soft, NT, ND, + BS, no organomegaly Neurological: Pt is alert. At baseline orientation, motor grossly intact Skin: Skin is warm. No rashes, other new lesions, no LE edema Psychiatric: Pt behavior is normal without agitation  All otherwise neg per pt Lab Results  Component Value Date   WBC 5.5 10/21/2019   HGB 14.9 10/21/2019   HCT 43.9 10/21/2019   PLT 249.0 10/21/2019   GLUCOSE 93 10/21/2019   CHOL 160 10/21/2019   TRIG 93.0 10/21/2019   HDL 45.20 10/21/2019   LDLDIRECT 90.2 10/27/2009   LDLCALC 96 10/21/2019   ALT 18  10/21/2019   AST 21 10/21/2019   NA 137 10/21/2019   K 4.1 10/21/2019   CL 101 10/21/2019   CREATININE 1.48 10/21/2019   BUN 22 10/21/2019   CO2 32 10/21/2019   TSH 3.67 10/21/2019   PSA 1.49 10/21/2019   INR 0.92 02/16/2011   HGBA1C 5.9 10/21/2019   MICROALBUR 1.1 04/05/2016      Assessment & Plan:

## 2019-10-28 NOTE — Assessment & Plan Note (Addendum)
Mild to mod, for steroid cream prn,  to f/u any worsening symptoms or concerns  I spent 32 minutes in addition to time for wellness examination in preparing to see the patient by review of recent labs, imaging and procedures, obtaining and reviewing separately obtained history, communicating with the patient and family or caregiver, ordering medications, tests or procedures, and documenting clinical information in the EHR including the differential Dx, treatment, and any further evaluation and other management of eczema, allergies, HLD, CKD

## 2019-10-28 NOTE — Assessment & Plan Note (Signed)
Also for ct cardiac score,, ma need goal ldl < 70 if abnormal

## 2019-10-28 NOTE — Assessment & Plan Note (Signed)
stable overall by history and exam, recent data reviewed with pt, and pt to continue medical treatment as before,  to f/u any worsening symptoms or concerns  

## 2019-10-28 NOTE — Patient Instructions (Signed)
Please take all new medication as prescribed - the cream [facility] to [facility]  Please take all new medication as prescribed - the nasal spray sent to [facility]  We have discussed the Cardiac CT Score test to measure the calcification level (if any) in your heart arteries.  This test has been ordered in our [facility], so please call  CT directly, as they prefer this, at [phone] to be scheduled.  Please continue all other medications as before, and refills have been done if requested.  Please have the pharmacy call with any other refills you may need.  Please continue your efforts at being more active, low cholesterol diet, and weight control.  You are otherwise up to date with prevention measures today.  Please keep your appointments with your specialists as you may have planned  Please make an Appointment to return for your 1 year visit, or sooner if needed, with Lab testing by Appointment as well, to be done about 3-5 days before at the [facility] (so this is for TWO appointments - please see the scheduling desk as you leave)

## 2019-10-28 NOTE — Assessment & Plan Note (Signed)
Ok for change flonase to dymista asd,  to f/u any worsening symptoms or concerns

## 2019-11-13 ENCOUNTER — Encounter: Payer: Self-pay | Admitting: Internal Medicine

## 2019-11-27 ENCOUNTER — Other Ambulatory Visit: Payer: Self-pay

## 2019-11-27 MED ORDER — LORATADINE 10 MG PO TABS: 10.0000 mg | ORAL_TABLET | Freq: Every day | ORAL | 3 refills | Status: DC

## 2020-01-11 ENCOUNTER — Telehealth: Payer: Self-pay | Admitting: Internal Medicine

## 2020-01-11 MED ORDER — METOPROLOL SUCCINATE ER 50 MG PO TB24: ORAL_TABLET | ORAL | 3 refills | Status: DC

## 2020-01-11 NOTE — Telephone Encounter (Signed)
Done erx to [facility]

## 2020-01-11 NOTE — Telephone Encounter (Signed)
   PLEASE CLARIFY INSTRUCTIONS WITH PHARMACY  1.Medication Requested:metoprolol succinate (TOPROL-XL) 50 MG 24 hr tablet  2. Pharmacy (Name, Street, [place]): [id number] - [place], [address] and send to [place]  3. On Med List: yes  4. Last Visit with PCP: 10/28/19  5. Next visit date with PCP:   Agent: Please be advised that RX refills may take up to 3 business days. We ask that you follow-up with your pharmacy.

## 2020-02-12 ENCOUNTER — Other Ambulatory Visit: Payer: Self-pay

## 2020-02-12 ENCOUNTER — Telehealth: Payer: Self-pay | Admitting: Internal Medicine

## 2020-02-12 MED ORDER — METOPROLOL SUCCINATE ER 50 MG PO TB24: ORAL_TABLET | ORAL | 3 refills | Status: DC

## 2020-02-12 NOTE — Telephone Encounter (Signed)
   1.Medication Requested: metoprolol succinate (TOPROL-XL) 50 MG 24 hr tablet  2. Pharmacy (Name, Street, [place]): [facility]  3. On Med List: yes  4. Last Visit with PCP: 10/28/19  5. Next visit date with PCP:   Agent: Please be advised that RX refills may take up to 3 business days. We ask that you follow-up with your pharmacy.

## 2020-06-17 ENCOUNTER — Ambulatory Visit: Payer: 59 | Admitting: Nurse Practitioner

## 2020-06-17 ENCOUNTER — Telehealth: Payer: Self-pay | Admitting: Internal Medicine

## 2020-06-17 ENCOUNTER — Ambulatory Visit: Payer: 59 | Admitting: Internal Medicine

## 2020-06-17 ENCOUNTER — Other Ambulatory Visit: Payer: Self-pay

## 2020-06-17 ENCOUNTER — Encounter: Payer: Self-pay | Admitting: Internal Medicine

## 2020-06-17 VITALS — BP 120/80 | HR 65 | Temp 98.1°F | Ht 74.0 in | Wt 187.0 lb

## 2020-06-17 DIAGNOSIS — G500 Trigeminal neuralgia: Secondary | ICD-10-CM | POA: Diagnosis not present

## 2020-06-17 DIAGNOSIS — R739 Hyperglycemia, unspecified: Secondary | ICD-10-CM | POA: Diagnosis not present

## 2020-06-17 DIAGNOSIS — I10 Essential (primary) hypertension: Secondary | ICD-10-CM | POA: Diagnosis not present

## 2020-06-17 MED ORDER — CARBAMAZEPINE ER 100 MG PO TB12: 100.0000 mg | ORAL_TABLET | Freq: Two times a day (BID) | ORAL | 11 refills | Status: DC

## 2020-06-17 NOTE — Telephone Encounter (Signed)
Patient called and states that he is having nerve pain on the left side of his face and his teeth are hurting. He said that this happened about 5 years ago. Transferred to [facility] at [employer].

## 2020-06-17 NOTE — Telephone Encounter (Signed)
   Patient called for appointment, scheduled today at [facility]

## 2020-06-17 NOTE — Progress Notes (Signed)
Subjective:    Patient ID: Brandon Sanders, male    DOB: 03/10/1956, 64 y.o.   MRN: [id number]  HPI  Here with 2-3 wks onset recurrence of left sided neuritic trigeminal neuralgia pain as occurred transiently several years ago, mild, constant, mostly mid left and upper facial/temple area, without swelling, fever, trauama or rash.  Has already seen dental with neg panorama films, no s/s sinusitis or allergies. Pt denies chest pain, increased sob or doe, wheezing, orthopnea, PND, increased LE swelling, palpitations, dizziness or syncope.  Pt denies other new neurological symptoms such as new headache, or facial or extremity weakness or numbness  Pt denies polydipsia, polyuria,  Past Medical History:  Diagnosis Date  . Carpal tunnel syndrome, bilateral   . Chronic renal insufficiency   . COLITIS 12/17/2007  . DJD of shoulder   . Erectile dysfunction    managed with injections; no response with Viagra/Cialis  . HTN (hypertension)   . Hyperlipidemia   . PLANTAR FASCIITIS 09/05/2010  . Renal cell cancer ([place])    currently in clinical trial using Afinitor  . Solitary kidney, acquired   . SYNCOPE 09/05/2010   Past Surgical History:  Procedure Laterality Date  . NEPHRECTOMY     Right  . s.p right CTS release    . s/p right ulnar nerve release      reports that he has never smoked. He has never used smokeless tobacco. He reports that he does not drink alcohol and does not use drugs. family history includes Heart disease in an other family member; Hypertension (age of onset: 53) in his father; Liver cancer in his maternal grandmother; Stroke in his father; Transient ischemic attack in his father. Allergies  Allergen Reactions  . Penicillins    Current Outpatient Medications on File Prior to Visit  Medication Sig Dispense Refill  . amLODipine (NORVASC) 5 MG tablet Take 1 tablet (5 mg total) by mouth daily. 90 tablet 3  . aspirin 81 MG EC tablet Take 81 mg by mouth daily.      Marland Kitchen azelastine  (OPTIVAR) 0.05 % ophthalmic solution Place 1 drop into both eyes 2 (two) times daily. 6 mL 5  . Azelastine-Fluticasone (DYMISTA) 137-50 MCG/ACT SUSP Use as directed twice daily each nares 69 g 3  . fenofibrate 160 MG tablet Take 1 tablet (160 mg total) by mouth daily. 90 tablet 3  . fluticasone (FLONASE) 50 MCG/ACT nasal spray USE 2 SPRAYS IN EACH  NOSTRIL DAILY 48 g 2  . ketoconazole (NIZORAL) 2 % cream APPLY TO AFFECTED AREA EVERY DAY 30 g 2  . loratadine (CLARITIN) 10 MG tablet Take 1 tablet (10 mg total) by mouth daily. 90 tablet 3  . metoprolol succinate (TOPROL-XL) 50 MG 24 hr tablet Take 1.5 tab by mouth daily 135 tablet 3  . pravastatin (PRAVACHOL) 40 MG tablet Take 1 tablet (40 mg total) by mouth daily. 90 tablet 3  . triamcinolone cream (KENALOG) 0.1 % Apply 1 application topically 2 (two) times daily. 30 g 3   No current facility-administered medications on file prior to visit.   Review of Systems All otherwise neg per pt    Objective:   Physical Exam BP 120/80 (BP Location: Left Arm, Patient Position: Sitting, Cuff Size: Large)   Pulse 65   Temp 98.1 F (36.7 C) (Oral)   Ht 6\' 2"  (1.88 m)   Wt 187 lb (84.8 kg)   SpO2 98%   BMI 24.01 kg/m  VS noted,  Constitutional:  Pt appears in NAD HENT: Head: NCAT.  Right Ear: External ear normal.  Left Ear: External ear normal.  Eyes: . Pupils are equal, round, and reactive to light. Conjunctivae and EOM are normal Nose: without d/c or deformity Neck: Neck supple. Gross normal ROM Cardiovascular: Normal rate and regular rhythm.   Pulmonary/Chest: Effort normal and breath sounds without rales or wheezing.  Neurological: Pt is alert. At baseline orientation, motor grossly intact, cn 2-12 intact Skin: Skin is warm. No rashes, other new lesions, no LE edema Psychiatric: Pt behavior is normal without agitation  All otherwise neg per pt Lab Results  Component Value Date   WBC 5.5 10/21/2019   HGB 14.9 10/21/2019   HCT 43.9  10/21/2019   PLT 249.0 10/21/2019   GLUCOSE 93 10/21/2019   CHOL 160 10/21/2019   TRIG 93.0 10/21/2019   HDL 45.20 10/21/2019   LDLDIRECT 90.2 10/27/2009   LDLCALC 96 10/21/2019   ALT 18 10/21/2019   AST 21 10/21/2019   NA 137 10/21/2019   K 4.1 10/21/2019   CL 101 10/21/2019   CREATININE 1.48 10/21/2019   BUN 22 10/21/2019   CO2 32 10/21/2019   TSH 3.67 10/21/2019   PSA 1.49 10/21/2019   INR 0.92 02/16/2011   HGBA1C 5.9 10/21/2019   MICROALBUR 1.1 04/05/2016      Assessment & Plan:

## 2020-06-17 NOTE — Telephone Encounter (Signed)
[facility] report/call : --Had a tooth pain for about 2 weeks. Seen dentist Wednesday . Could not find what is going on. Has only been lower and now moved to upper teeth. Had same thing 6 years ago. No earache. Pain is back lower and top teeth. No swelling. Sensitive to cold. Headache present with tooth pain.  Advised see PCP within 24 hours. - Appointment made.

## 2020-06-17 NOTE — Patient Instructions (Signed)
Please take all new medication as prescribed - the tegretol at 100 mg twice per day  Please call in 1 week if you feel you need a higher dose  Please call if you feel you need a referral to neurosurgury if it continues to get worse  Please continue all other medications as before, and refills have been done if requested.  Please have the pharmacy call with any other refills you may need.  Please continue your efforts at being more active, low cholesterol diet, and weight control.  Please keep your appointments with your specialists as you may have planned

## 2020-06-18 ENCOUNTER — Encounter: Payer: Self-pay | Admitting: Internal Medicine

## 2020-06-18 NOTE — Assessment & Plan Note (Signed)
stable overall by history and exam, recent data reviewed with pt, and pt to continue medical treatment as before,  to f/u any worsening symptoms or concerns  

## 2020-06-18 NOTE — Assessment & Plan Note (Addendum)
Mild recurrent , for restart low dose tegretol, consider MRI and/or NS referral,  to f/u any worsening symptoms or concerns  I spent 31 minutes in preparing to see the patient by review of recent labs, imaging and procedures, obtaining and reviewing separately obtained history, communicating with the patient and family or caregiver, ordering medications, tests or procedures, and documenting clinical information in the EHR including the differential Dx, treatment, and any further evaluation and other management of trigeminal neuralai, hyperglycemia, htn

## 2020-09-16 ENCOUNTER — Encounter: Payer: Self-pay | Admitting: Internal Medicine

## 2020-09-16 ENCOUNTER — Other Ambulatory Visit: Payer: Self-pay

## 2020-09-16 ENCOUNTER — Telehealth (INDEPENDENT_AMBULATORY_CARE_PROVIDER_SITE_OTHER): Payer: 59 | Admitting: Internal Medicine

## 2020-09-16 DIAGNOSIS — J309 Allergic rhinitis, unspecified: Secondary | ICD-10-CM | POA: Diagnosis not present

## 2020-09-16 MED ORDER — AZITHROMYCIN 250 MG PO TABS: ORAL_TABLET | ORAL | 0 refills | Status: DC

## 2020-09-16 NOTE — Assessment & Plan Note (Signed)
With sinusitis, rx azithromycin 7 day course.

## 2020-09-16 NOTE — Progress Notes (Signed)
Virtual Visit via Audio Note  I connected with Brandon Sanders on 09/16/20 at  8:20 AM EST by an audio-only enabled telemedicine application and verified that I am speaking with the correct person using two identifiers.  The patient and the provider were at separate locations throughout the entire encounter. Patient location: home, Provider location: work    I discussed the limitations of evaluation and management by telemedicine and the availability of in person appointments. The patient expressed understanding and agreed to proceed. The patient and the provider were the only parties present for the visit unless noted in HPI below.  History of Present Illness: The patient is a 65 y.o. man with visit for sinus problems. Started 1 month ago with allergies and has progressed since that time. Has some cough due to the drainage. Denies fevers and chills. Overall it is not improving and may be worsening. Has tried claritin and flonase routinely and did stop flonase for a time with dry nose symptoms.   Observations/Objective: A and O times 3, voice strong, no coughing during visit, speaking in full sentences  Assessment and Plan: See problem oriented charting  Follow Up Instructions: rx 7 day azithromycin due to pcn allergy  Visit time 8 minutes in non-face to face communication with patient and coordination of care.  I discussed the assessment and treatment plan with the patient. The patient was provided an opportunity to ask questions and all were answered. The patient agreed with the plan and demonstrated an understanding of the instructions.   The patient was advised to call back or seek an in-person evaluation if the symptoms worsen or if the condition fails to improve as anticipated.  Hoyt Koch, MD

## 2020-10-04 ENCOUNTER — Ambulatory Visit: Payer: 59 | Admitting: Internal Medicine

## 2020-10-04 ENCOUNTER — Other Ambulatory Visit: Payer: Self-pay

## 2020-10-04 ENCOUNTER — Encounter: Payer: Self-pay | Admitting: Internal Medicine

## 2020-10-04 VITALS — BP 130/80 | HR 59 | Temp 98.2°F | Ht 74.0 in | Wt 188.0 lb

## 2020-10-04 DIAGNOSIS — I10 Essential (primary) hypertension: Secondary | ICD-10-CM

## 2020-10-04 DIAGNOSIS — R739 Hyperglycemia, unspecified: Secondary | ICD-10-CM | POA: Diagnosis not present

## 2020-10-04 DIAGNOSIS — M79604 Pain in right leg: Secondary | ICD-10-CM

## 2020-10-06 DIAGNOSIS — M545 Low back pain, unspecified: Secondary | ICD-10-CM | POA: Insufficient documentation

## 2020-10-09 ENCOUNTER — Encounter: Payer: Self-pay | Admitting: Internal Medicine

## 2020-10-09 DIAGNOSIS — M79604 Pain in right leg: Secondary | ICD-10-CM | POA: Insufficient documentation

## 2020-10-09 NOTE — Progress Notes (Signed)
Established Patient Office Visit  Subjective:  Patient ID: Brandon Sanders, male    DOB: October 30, 1955  Age: 65 y.o. MRN: [id number]      Chief Complaint: right leg pain       HPI:  Brandon Sanders is a 65 y.o. male here with c/o right leg pain burning numb like without severe pain or weakness, but 1 wk starting at the right buttock with radation distal to below the knee, worse with sitting and standing up, but more walking seems to help as well as advil prn; Sitting with an extended leg will make worse.  Denies right low back at all.  No trauma, falls, fever, or urinary symptoms.  Pt denies chest pain, increased sob or doe, wheezing, orthopnea, PND, increased LE swelling, palpitations, dizziness or syncope.   Pt denies polydipsia, polyuria,   .        Wt Readings from Last 3 Encounters:  10/04/20 188 lb (85.3 kg)  06/17/20 187 lb (84.8 kg)  10/28/19 183 lb 12.8 oz (83.4 kg)   BP Readings from Last 3 Encounters:  10/04/20 130/80  06/17/20 120/80  10/28/19 130/62         Past Medical History:  Diagnosis Date  . Carpal tunnel syndrome, bilateral   . Chronic renal insufficiency   . COLITIS 12/17/2007  . DJD of shoulder   . Erectile dysfunction    managed with injections; no response with Viagra/Cialis  . HTN (hypertension)   . Hyperlipidemia   . PLANTAR FASCIITIS 09/05/2010  . Renal cell cancer ([place])    currently in clinical trial using Afinitor  . Solitary kidney, acquired   . SYNCOPE 09/05/2010   Past Surgical History:  Procedure Laterality Date  . NEPHRECTOMY     Right  . s.p right CTS release    . s/p right ulnar nerve release      reports that he has never smoked. He has never used smokeless tobacco. He reports that he does not drink alcohol and does not use drugs. family history includes Heart disease in an other family member; Hypertension (age of onset: 54) in his father; Liver cancer in his maternal grandmother; Stroke in his father; Transient ischemic attack in his  father. Allergies  Allergen Reactions  . Penicillins    Current Outpatient Medications on File Prior to Visit  Medication Sig Dispense Refill  . amLODipine (NORVASC) 5 MG tablet Take 1 tablet (5 mg total) by mouth daily. 90 tablet 3  . aspirin 81 MG EC tablet Take 81 mg by mouth daily.    Marland Kitchen azelastine (OPTIVAR) 0.05 % ophthalmic solution Place 1 drop into both eyes 2 (two) times daily. 6 mL 5  . Azelastine-Fluticasone (DYMISTA) 137-50 MCG/ACT SUSP Use as directed twice daily each nares 69 g 3  . azithromycin (ZITHROMAX) 250 MG tablet Day 1 take 2 pills, days 2-7 take 1 pill daily. 8 tablet 0  . carbamazepine (TEGRETOL-XR) 100 MG 12 hr tablet Take 1 tablet (100 mg total) by mouth 2 (two) times daily. 60 tablet 11  . fenofibrate 160 MG tablet Take 1 tablet (160 mg total) by mouth daily. 90 tablet 3  . fluticasone (FLONASE) 50 MCG/ACT nasal spray USE 2 SPRAYS IN EACH  NOSTRIL DAILY 48 g 2  . ketoconazole (NIZORAL) 2 % cream APPLY TO AFFECTED AREA EVERY DAY 30 g 2  . loratadine (CLARITIN) 10 MG tablet Take 1 tablet (10 mg total) by mouth daily. 90 tablet 3  . metoprolol  succinate (TOPROL-XL) 50 MG 24 hr tablet Take 1.5 tab by mouth daily 135 tablet 3  . pravastatin (PRAVACHOL) 40 MG tablet Take 1 tablet (40 mg total) by mouth daily. 90 tablet 3  . triamcinolone cream (KENALOG) 0.1 % Apply 1 application topically 2 (two) times daily. 30 g 3   No current facility-administered medications on file prior to visit.        ROS:  All others reviewed and negative.  Objective        PE:  BP 130/80   Pulse (!) 59   Temp 98.2 F (36.8 C) (Temporal)   Ht 6\' 2"  (1.88 m)   Wt 188 lb (85.3 kg)   SpO2 97%   BMI 24.14 kg/m                 Constitutional: Pt appears in NAD               HENT: Head: NCAT.                Right Ear: External ear normal.                 Left Ear: External ear normal.                Eyes: . Pupils are equal, round, and reactive to light. Conjunctivae and EOM are  normal               Nose: without d/c or deformity               Neck: Neck supple. Gross normal ROM               Cardiovascular: Normal rate and regular rhythm.                 Pulmonary/Chest: Effort normal and breath sounds without rales or wheezing.                Abd:  Soft, NT, ND, + BS, no organomegaly               Neurological: Pt is alert. At baseline orientation, motor grossly intact, RLE intact to LT               Skin: Skin is warm. No rashes, no other new lesions, LE edema - none               Psychiatric: Pt behavior is normal without agitation   Assessment/Plan:  Brandon Sanders is a 65 y.o. White or Caucasian [1] male with  has a past medical history of Carpal tunnel syndrome, bilateral, Chronic renal insufficiency, COLITIS (12/17/2007), DJD of shoulder, Erectile dysfunction, HTN (hypertension), Hyperlipidemia, PLANTAR FASCIITIS (09/05/2010), Renal cell cancer ([place]), Solitary kidney, acquired, and SYNCOPE (09/05/2010).   Micro: none  Cardiac tracings I have personally interpreted today:  none  Pertinent Radiological findings (summarize): none   Lab Results  Component Value Date   WBC 5.5 10/21/2019   HGB 14.9 10/21/2019   HCT 43.9 10/21/2019   PLT 249.0 10/21/2019   GLUCOSE 93 10/21/2019   CHOL 160 10/21/2019   TRIG 93.0 10/21/2019   HDL 45.20 10/21/2019   LDLDIRECT 90.2 10/27/2009   LDLCALC 96 10/21/2019   ALT 18 10/21/2019   AST 21 10/21/2019   NA 137 10/21/2019   K 4.1 10/21/2019   CL 101 10/21/2019   CREATININE 1.48 10/21/2019   BUN 22 10/21/2019   CO2 32 10/21/2019   TSH 3.67 10/21/2019  PSA 1.49 10/21/2019   INR 0.92 02/16/2011   HGBA1C 5.9 10/21/2019   MICROALBUR 1.1 04/05/2016     Assessment & Plan:   Problem List Items Addressed This Visit      Medium   Hyperglycemia    Lab Results  Component Value Date   HGBA1C 5.9 10/21/2019   Stable, pt to continue current medical treatment  - diet       Essential hypertension - Primary    BP  Readings from Last 3 Encounters:  10/04/20 130/80  06/17/20 120/80  10/28/19 130/62   Stable, pt to continue medical treatment norvasc toprol   Current Outpatient Medications (Cardiovascular):  .  amLODipine (NORVASC) 5 MG tablet, Take 1 tablet (5 mg total) by mouth daily. .  fenofibrate 160 MG tablet, Take 1 tablet (160 mg total) by mouth daily. .  metoprolol succinate (TOPROL-XL) 50 MG 24 hr tablet, Take 1.5 tab by mouth daily .  pravastatin (PRAVACHOL) 40 MG tablet, Take 1 tablet (40 mg total) by mouth daily.  Current Outpatient Medications (Respiratory):  Marland Kitchen  Azelastine-Fluticasone (DYMISTA) 137-50 MCG/ACT SUSP, Use as directed twice daily each nares .  fluticasone (FLONASE) 50 MCG/ACT nasal spray, USE 2 SPRAYS IN EACH  NOSTRIL DAILY .  loratadine (CLARITIN) 10 MG tablet, Take 1 tablet (10 mg total) by mouth daily.  Current Outpatient Medications (Analgesics):  .  aspirin 81 MG EC tablet, Take 81 mg by mouth daily.   Current Outpatient Medications (Other):  .  azelastine (OPTIVAR) 0.05 % ophthalmic solution, Place 1 drop into both eyes 2 (two) times daily. Marland Kitchen  azithromycin (ZITHROMAX) 250 MG tablet, Day 1 take 2 pills, days 2-7 take 1 pill daily. .  carbamazepine (TEGRETOL-XR) 100 MG 12 hr tablet, Take 1 tablet (100 mg total) by mouth 2 (two) times daily. Marland Kitchen  ketoconazole (NIZORAL) 2 % cream, APPLY TO AFFECTED AREA EVERY DAY .  triamcinolone cream (KENALOG) 0.1 %, Apply 1 application topically 2 (two) times daily.         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   Follow-up: Return if symptoms worsen or fail to improve.   Cathlean Cower, MD 10/09/2020 12:55  PM [facility]

## 2020-10-09 NOTE — Assessment & Plan Note (Signed)
BP Readings from Last 3 Encounters:  10/04/20 130/80  06/17/20 120/80  10/28/19 130/62   Stable, pt to continue medical treatment norvasc toprol   Current Outpatient Medications (Cardiovascular):  .  amLODipine (NORVASC) 5 MG tablet, Take 1 tablet (5 mg total) by mouth daily. .  fenofibrate 160 MG tablet, Take 1 tablet (160 mg total) by mouth daily. .  metoprolol succinate (TOPROL-XL) 50 MG 24 hr tablet, Take 1.5 tab by mouth daily .  pravastatin (PRAVACHOL) 40 MG tablet, Take 1 tablet (40 mg total) by mouth daily.  Current Outpatient Medications (Respiratory):  Marland Kitchen  Azelastine-Fluticasone (DYMISTA) 137-50 MCG/ACT SUSP, Use as directed twice daily each nares .  fluticasone (FLONASE) 50 MCG/ACT nasal spray, USE 2 SPRAYS IN EACH  NOSTRIL DAILY .  loratadine (CLARITIN) 10 MG tablet, Take 1 tablet (10 mg total) by mouth daily.  Current Outpatient Medications (Analgesics):  .  aspirin 81 MG EC tablet, Take 81 mg by mouth daily.   Current Outpatient Medications (Other):  .  azelastine (OPTIVAR) 0.05 % ophthalmic solution, Place 1 drop into both eyes 2 (two) times daily. Marland Kitchen  azithromycin (ZITHROMAX) 250 MG tablet, Day 1 take 2 pills, days 2-7 take 1 pill daily. .  carbamazepine (TEGRETOL-XR) 100 MG 12 hr tablet, Take 1 tablet (100 mg total) by mouth 2 (two) times daily. Marland Kitchen  ketoconazole (NIZORAL) 2 % cream, APPLY TO AFFECTED AREA EVERY DAY .  triamcinolone cream (KENALOG) 0.1 %, Apply 1 application topically 2 (two) times daily.

## 2020-10-09 NOTE — Assessment & Plan Note (Signed)
Lab Results  Component Value Date   HGBA1C 5.9 10/21/2019   Stable, pt to continue current medical treatment  - diet

## 2020-10-09 NOTE — Assessment & Plan Note (Signed)
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

## 2020-10-09 NOTE — Patient Instructions (Signed)
Please continue all other medications as before, and refills have been done if requested.  Please have the pharmacy call with any other refills you may need.  Please keep your appointments with your specialists as you may have planned  You will be contacted regarding the referral for: Dr Nelva Bush

## 2020-10-13 ENCOUNTER — Other Ambulatory Visit: Payer: Self-pay | Admitting: Internal Medicine

## 2020-10-13 NOTE — Telephone Encounter (Signed)
Please refill as per office routine med refill policy (all routine meds refilled for 3 mo or monthly per pt preference up to one year from last visit, then month to month grace period for 3 mo, then further med refills will have to be denied)  

## 2020-11-21 DIAGNOSIS — M5416 Radiculopathy, lumbar region: Secondary | ICD-10-CM | POA: Insufficient documentation

## 2020-12-01 ENCOUNTER — Other Ambulatory Visit: Payer: Self-pay | Admitting: Physical Medicine and Rehabilitation

## 2020-12-01 DIAGNOSIS — M5459 Other low back pain: Secondary | ICD-10-CM

## 2020-12-04 DIAGNOSIS — M48061 Spinal stenosis, lumbar region without neurogenic claudication: Secondary | ICD-10-CM | POA: Insufficient documentation

## 2020-12-07 ENCOUNTER — Other Ambulatory Visit: Payer: 59

## 2020-12-26 ENCOUNTER — Other Ambulatory Visit: Payer: Self-pay | Admitting: Orthopaedic Surgery

## 2020-12-26 ENCOUNTER — Telehealth: Payer: Self-pay

## 2020-12-26 DIAGNOSIS — G8929 Other chronic pain: Secondary | ICD-10-CM

## 2020-12-26 DIAGNOSIS — M545 Low back pain, unspecified: Secondary | ICD-10-CM

## 2020-12-26 NOTE — Telephone Encounter (Signed)
Phone call to patient to screen him for myelogram procedure ordered by Dr. Towanda Malkin office. Pt reports "I do not want to have this done". Pt states he is getting a second opinion and does not which to proceed with the myelogram procedure. Pt has our contact information if he were to change his mind in the near future.

## 2021-01-02 ENCOUNTER — Other Ambulatory Visit: Payer: Self-pay | Admitting: Internal Medicine

## 2021-01-03 ENCOUNTER — Other Ambulatory Visit: Payer: Self-pay | Admitting: Internal Medicine

## 2021-01-12 ENCOUNTER — Encounter: Payer: Self-pay | Admitting: Internal Medicine

## 2021-01-24 ENCOUNTER — Ambulatory Visit: Payer: 59 | Admitting: Internal Medicine

## 2021-02-16 ENCOUNTER — Other Ambulatory Visit (INDEPENDENT_AMBULATORY_CARE_PROVIDER_SITE_OTHER): Payer: 59

## 2021-02-16 DIAGNOSIS — E538 Deficiency of other specified B group vitamins: Secondary | ICD-10-CM | POA: Diagnosis not present

## 2021-02-16 DIAGNOSIS — Z0000 Encounter for general adult medical examination without abnormal findings: Secondary | ICD-10-CM

## 2021-02-16 DIAGNOSIS — Z114 Encounter for screening for human immunodeficiency virus [HIV]: Secondary | ICD-10-CM

## 2021-02-16 DIAGNOSIS — E611 Iron deficiency: Secondary | ICD-10-CM

## 2021-02-16 DIAGNOSIS — E559 Vitamin D deficiency, unspecified: Secondary | ICD-10-CM | POA: Diagnosis not present

## 2021-02-16 LAB — URINALYSIS, ROUTINE W REFLEX MICROSCOPIC
Bilirubin Urine: NEGATIVE
Hgb urine dipstick: NEGATIVE
Ketones, ur: NEGATIVE
Leukocytes,Ua: NEGATIVE
Nitrite: NEGATIVE
RBC / HPF: NONE SEEN (ref 0–?)
Specific Gravity, Urine: 1.025 (ref 1.000–1.030)
Total Protein, Urine: NEGATIVE
Urine Glucose: NEGATIVE
Urobilinogen, UA: 0.2 (ref 0.0–1.0)
pH: 5.5 (ref 5.0–8.0)

## 2021-02-16 LAB — CBC WITH DIFFERENTIAL/PLATELET
Basophils Absolute: 0 10*3/uL (ref 0.0–0.1)
Basophils Relative: 0.7 % (ref 0.0–3.0)
Eosinophils Absolute: 0.1 10*3/uL (ref 0.0–0.7)
Eosinophils Relative: 3.4 % (ref 0.0–5.0)
HCT: 45.5 % (ref 39.0–52.0)
Hemoglobin: 15.4 g/dL (ref 13.0–17.0)
Lymphocytes Relative: 34.2 % (ref 12.0–46.0)
Lymphs Abs: 1.3 10*3/uL (ref 0.7–4.0)
MCHC: 33.9 g/dL (ref 30.0–36.0)
MCV: 89.6 fl (ref 78.0–100.0)
Monocytes Absolute: 0.5 10*3/uL (ref 0.1–1.0)
Monocytes Relative: 13 % — ABNORMAL HIGH (ref 3.0–12.0)
Neutro Abs: 1.9 10*3/uL (ref 1.4–7.7)
Neutrophils Relative %: 48.7 % (ref 43.0–77.0)
Platelets: 235 10*3/uL (ref 150.0–400.0)
RBC: 5.08 Mil/uL (ref 4.22–5.81)
RDW: 13.3 % (ref 11.5–15.5)
WBC: 3.9 10*3/uL — ABNORMAL LOW (ref 4.0–10.5)

## 2021-02-16 LAB — PSA: PSA: 2.01 ng/mL (ref 0.10–4.00)

## 2021-02-16 LAB — HEPATIC FUNCTION PANEL
ALT: 20 U/L (ref 0–53)
AST: 22 U/L (ref 0–37)
Albumin: 4.3 g/dL (ref 3.5–5.2)
Alkaline Phosphatase: 74 U/L (ref 39–117)
Bilirubin, Direct: 0.1 mg/dL (ref 0.0–0.3)
Total Bilirubin: 0.4 mg/dL (ref 0.2–1.2)
Total Protein: 6.8 g/dL (ref 6.0–8.3)

## 2021-02-16 LAB — TSH: TSH: 4.45 u[IU]/mL (ref 0.35–4.50)

## 2021-02-16 LAB — LIPID PANEL
Cholesterol: 113 mg/dL (ref 0–200)
HDL: 34.9 mg/dL — ABNORMAL LOW (ref >39.00)
LDL Cholesterol: 58 mg/dL (ref 0–99)
NonHDL: 77.71
Total CHOL/HDL Ratio: 3
Triglycerides: 99 mg/dL (ref 0.0–149.0)
VLDL: 19.8 mg/dL (ref 0.0–40.0)

## 2021-02-16 LAB — BASIC METABOLIC PANEL
BUN: 24 mg/dL — ABNORMAL HIGH (ref 6–23)
CO2: 27 mEq/L (ref 19–32)
Calcium: 9.4 mg/dL (ref 8.4–10.5)
Chloride: 103 mEq/L (ref 96–112)
Creatinine, Ser: 1.42 mg/dL (ref 0.40–1.50)
GFR: 52.61 mL/min — ABNORMAL LOW (ref >60.00)
Glucose, Bld: 85 mg/dL (ref 70–99)
Potassium: 4.5 mEq/L (ref 3.5–5.1)
Sodium: 140 mEq/L (ref 135–145)

## 2021-02-16 LAB — IBC PANEL
Iron: 68 ug/dL (ref 42–165)
Saturation Ratios: 17.5 % — ABNORMAL LOW (ref 20.0–50.0)
Transferrin: 278 mg/dL (ref 212.0–360.0)

## 2021-02-16 LAB — VITAMIN B12: Vitamin B-12: 252 pg/mL (ref 211–911)

## 2021-02-16 LAB — VITAMIN D 25 HYDROXY (VIT D DEFICIENCY, FRACTURES): VITD: 37.37 ng/mL (ref 30.00–100.00)

## 2021-02-17 LAB — HIV ANTIBODY (ROUTINE TESTING W REFLEX): HIV 1&2 Ab, 4th Generation: NONREACTIVE

## 2021-02-21 ENCOUNTER — Other Ambulatory Visit: Payer: Self-pay

## 2021-02-22 ENCOUNTER — Encounter: Payer: 59 | Admitting: Internal Medicine

## 2021-02-22 ENCOUNTER — Encounter: Payer: Self-pay | Admitting: Internal Medicine

## 2021-02-22 ENCOUNTER — Other Ambulatory Visit: Payer: Self-pay

## 2021-02-22 ENCOUNTER — Ambulatory Visit (INDEPENDENT_AMBULATORY_CARE_PROVIDER_SITE_OTHER): Payer: 59 | Admitting: Internal Medicine

## 2021-02-22 VITALS — BP 126/74 | HR 54 | Temp 98.0°F | Ht 74.0 in | Wt 184.0 lb

## 2021-02-22 DIAGNOSIS — Z0001 Encounter for general adult medical examination with abnormal findings: Secondary | ICD-10-CM

## 2021-02-22 DIAGNOSIS — E7800 Pure hypercholesterolemia, unspecified: Secondary | ICD-10-CM

## 2021-02-22 DIAGNOSIS — E538 Deficiency of other specified B group vitamins: Secondary | ICD-10-CM

## 2021-02-22 DIAGNOSIS — C751 Malignant neoplasm of pituitary gland: Secondary | ICD-10-CM | POA: Insufficient documentation

## 2021-02-22 DIAGNOSIS — C649 Malignant neoplasm of unspecified kidney, except renal pelvis: Secondary | ICD-10-CM | POA: Insufficient documentation

## 2021-02-22 DIAGNOSIS — R739 Hyperglycemia, unspecified: Secondary | ICD-10-CM

## 2021-02-22 DIAGNOSIS — Z1211 Encounter for screening for malignant neoplasm of colon: Secondary | ICD-10-CM | POA: Diagnosis not present

## 2021-02-22 DIAGNOSIS — M5416 Radiculopathy, lumbar region: Secondary | ICD-10-CM

## 2021-02-22 DIAGNOSIS — E559 Vitamin D deficiency, unspecified: Secondary | ICD-10-CM

## 2021-02-22 DIAGNOSIS — N1831 Chronic kidney disease, stage 3a: Secondary | ICD-10-CM

## 2021-02-22 DIAGNOSIS — I10 Essential (primary) hypertension: Secondary | ICD-10-CM

## 2021-02-22 DIAGNOSIS — Z79899 Other long term (current) drug therapy: Secondary | ICD-10-CM | POA: Insufficient documentation

## 2021-02-22 MED ORDER — PANTOPRAZOLE SODIUM 40 MG PO TBEC: 40.0000 mg | DELAYED_RELEASE_TABLET | Freq: Every day | ORAL | 3 refills | Status: DC

## 2021-02-22 NOTE — Patient Instructions (Addendum)
Please take all new medication as prescribed - the protonix to help avoid bleeding stomach ulcers  You will be contacted regarding the referral for: colonoscopy  Please continue all other medications as before, and refills have been done if requested.  Please have the pharmacy call with any other refills you may need.  Please continue your efforts at being more active, low cholesterol diet, and weight control.  You are otherwise up to date with prevention measures today.  Please keep your appointments with your specialists as you may have planned [facility] soon  Please make an Appointment to return for your 1 year visit, or sooner if needed, with Lab testing by Appointment as well, to be done about 3-5 days before at the [place] (so this is for TWO appointments - please see the scheduling desk as you leave)   Due to the ongoing Covid 19 pandemic, our lab now requires an appointment for any labs done at our office.  If you need labs done and do not have an appointment, please call our office ahead of time to schedule before presenting to the lab for your testing.

## 2021-02-22 NOTE — Progress Notes (Signed)
Patient ID: Brandon Sanders, male   DOB: 1956/06/01, 65 y.o.   MRN: [id number]         Chief Complaint:: wellness exam and persistent right lumbar radiculopathy, htn, hld, hyperglycemia, ckd,        HPI:  Brandon Sanders is a 65 y.o. male here for wellness exam; declines immunizations, due for colonscopy for now, o/w up to date with preventive referrals and immunizations                        Also Pt continues to have recurring right LBP with RLE pain and weakness, but no bowel or bladder change, fever, wt loss, or falls.  Follows closely with surgury s/p ESI x 3, stopped gabapentin and celebrex as too sedating and did not seem to control pain, now better with ibuprofen 400 bid reguarly dosing, Denies worsening reflux, abd pain, dysphagia, n/v, bowel change or blood.  Does not take preventive PPI or similar.  Has f/u planned [place] neurology soon.  Also has CT f/u for hx of renal CA ongoing.  Pt denies chest pain, increased sob or doe, wheezing, orthopnea, PND, increased LE swelling, palpitations, dizziness or syncope.   Pt denies polydipsia, polyuria, or new focal neuro s/s.   Pt denies fever, wt loss, night sweats, loss of appetite, or other constitutional symptoms    Wt Readings from Last 3 Encounters:  02/22/21 184 lb (83.5 kg)  10/04/20 188 lb (85.3 kg)  06/17/20 187 lb (84.8 kg)   BP Readings from Last 3 Encounters:  02/22/21 126/74  10/04/20 130/80  06/17/20 120/80   Immunization History  Administered Date(s) Administered   H1N1 07/12/2008   Influenza Whole 07/12/2008   Influenza, High Dose Seasonal PF 06/09/2015   Influenza, Seasonal, Injecte, Preservative Fre 06/03/2014   Influenza,inj,Quad PF,6+ Mos 06/10/2014, 06/19/2018, 05/21/2019, 05/26/2020   Influenza,inj,quad, With Preservative 06/10/2017   Influenza-Unspecified 07/12/2011, 06/16/2012, 06/10/2014, 07/12/2015, 06/19/2018   PFIZER(Purple Top)SARS-COV-2 Vaccination 10/21/2019, 12/09/2019, 07/26/2020   Td 10/11/2000    Tdap 12/26/2010   Unspecified SARS-COV-2 Vaccination 11/18/2019   There are no preventive care reminders to display for this patient.     Past Medical History:  Diagnosis Date   Carpal tunnel syndrome, bilateral    Chronic renal insufficiency    COLITIS 12/17/2007   DJD of shoulder    Erectile dysfunction    managed with injections; no response with Viagra/Cialis   HTN (hypertension)    Hyperlipidemia    PLANTAR FASCIITIS 09/05/2010   Renal cell cancer ([place])    currently in clinical trial using Afinitor   Solitary kidney, acquired    SYNCOPE 09/05/2010   Past Surgical History:  Procedure Laterality Date   NEPHRECTOMY     Right   s.p right CTS release     s/p right ulnar nerve release      reports that he has never smoked. He has never used smokeless tobacco. He reports that he does not drink alcohol and does not use drugs. family history includes Heart disease in an other family member; Hypertension (age of onset: 84) in his father; Liver cancer in his maternal grandmother; Stroke in his father; Transient ischemic attack in his father. Allergies  Allergen Reactions   Penicillins    Current Outpatient Medications on File Prior to Visit  Medication Sig Dispense Refill   ALLERGY RELIEF 10 MG tablet TAKE 1 TABLET BY MOUTH  DAILY 90 tablet 0   amLODipine (NORVASC) 5 MG  tablet TAKE 1 TABLET BY MOUTH  DAILY 90 tablet 3   aspirin 81 MG EC tablet Take 81 mg by mouth daily.     azelastine (OPTIVAR) 0.05 % ophthalmic solution Place 1 drop into both eyes 2 (two) times daily. 6 mL 5   fenofibrate 160 MG tablet TAKE 1 TABLET BY MOUTH  DAILY 90 tablet 3   fluticasone (FLONASE) 50 MCG/ACT nasal spray USE 2 SPRAYS IN EACH  NOSTRIL DAILY 48 g 2   ketoconazole (NIZORAL) 2 % cream APPLY TO AFFECTED AREA EVERY DAY 30 g 2   metoprolol succinate (TOPROL-XL) 50 MG 24 hr tablet TAKE 1 AND 1/2 TABLETS BY  MOUTH DAILY 135 tablet 3   pravastatin (PRAVACHOL) 40 MG tablet TAKE 1 TABLET BY MOUTH  DAILY  90 tablet 3   No current facility-administered medications on file prior to visit.        ROS:  All others reviewed and negative.  Objective        PE:  BP 126/74 (BP Location: Left Arm, Patient Position: Sitting, Cuff Size: Normal)   Pulse (!) 54   Temp 98 F (36.7 C) (Oral)   Ht 6\' 2"  (1.88 m)   Wt 184 lb (83.5 kg)   SpO2 97%   BMI 23.62 kg/m                 Constitutional: Pt appears in NAD               HENT: Head: NCAT.                Right Ear: External ear normal.                 Left Ear: External ear normal.                Eyes: . Pupils are equal, round, and reactive to light. Conjunctivae and EOM are normal               Nose: without d/c or deformity               Neck: Neck supple. Gross normal ROM               Cardiovascular: Normal rate and regular rhythm.                 Pulmonary/Chest: Effort normal and breath sounds without rales or wheezing.                Abd:  Soft, NT, ND, + BS, no organomegaly               Neurological: Pt is alert. At baseline orientation, motor grossly intact               Skin: Skin is warm. No rashes, no other new lesions, LE edema - none               Psychiatric: Pt behavior is normal without agitation   Micro: none  Cardiac tracings I have personally interpreted today:  none  Pertinent Radiological findings (summarize): none   Lab Results  Component Value Date   WBC 3.9 (L) 02/16/2021   HGB 15.4 02/16/2021   HCT 45.5 02/16/2021   PLT 235.0 02/16/2021   GLUCOSE 85 02/16/2021   CHOL 113 02/16/2021   TRIG 99.0 02/16/2021   HDL 34.90 (L) 02/16/2021   LDLDIRECT 90.2 10/27/2009   LDLCALC 58 02/16/2021   ALT 20 02/16/2021  AST 22 02/16/2021   NA 140 02/16/2021   K 4.5 02/16/2021   CL 103 02/16/2021   CREATININE 1.42 02/16/2021   BUN 24 (H) 02/16/2021   CO2 27 02/16/2021   TSH 4.45 02/16/2021   PSA 2.01 02/16/2021   INR 0.92 02/16/2011   HGBA1C 5.9 10/21/2019   MICROALBUR 1.1 04/05/2016   Assessment/Plan:   Brandon Sanders is a 65 y.o. White or Caucasian [1] male with  has a past medical history of Carpal tunnel syndrome, bilateral, Chronic renal insufficiency, COLITIS (12/17/2007), DJD of shoulder, Erectile dysfunction, HTN (hypertension), Hyperlipidemia, PLANTAR FASCIITIS (09/05/2010), Renal cell cancer ([place]), Solitary kidney, acquired, and SYNCOPE (09/05/2010).  Encounter for well adult exam with abnormal findings Age and sex appropriate education and counseling updated with regular exercise and diet Referrals for preventative services - for colonoscopy Immunizations addressed - declines covid booster, shingrix pneumovax, tdap Smoking counseling  - none needed Evidence for depression or other mood disorder - none significant Most recent labs reviewed. I have personally reviewed and have noted: 1) the patient's medical and social history 2) The patient's current medications and supplements 3) The patient's height, weight, and BMI have been recorded in the chart   HYPERCHOLESTEROLEMIA Lab Results  Component Value Date   LDLCALC 58 02/16/2021   Stable, pt to continue current statin pravachol   HTN (hypertension) BP Readings from Last 3 Encounters:  02/22/21 126/74  10/04/20 130/80  06/17/20 120/80   Stable, pt to continue medical treatment toprol , norvasc   CKD (chronic kidney disease) stage 3, GFR 30-59 ml/min (HCC) Lab Results  Component Value Date   CREATININE 1.42 02/16/2021   Stable overall, cont to avoid nephrotoxins   Hyperglycemia Lab Results  Component Value Date   HGBA1C 5.9 10/21/2019   Stable, pt to continue current medical treatment  - diet   Lumbar radiculopathy Chronic persistent uncontrolled, pt taking ibuprofen bid in the setting of ckd; declines pain management referral, to f/u with neurology as planned, also add protonix 40 qd preventive  Followup: Return in about 1 year (around 02/22/2022).  Cathlean Cower, MD 02/26/2021 12:21 PM [facility]

## 2021-02-26 ENCOUNTER — Encounter: Payer: Self-pay | Admitting: Internal Medicine

## 2021-02-26 NOTE — Assessment & Plan Note (Addendum)
Age and sex appropriate education and counseling updated with regular exercise and diet Referrals for preventative services - for colonoscopy Immunizations addressed - declines covid booster, shingrix pneumovax, tdap Smoking counseling  - none needed Evidence for depression or other mood disorder - none significant Most recent labs reviewed. I have personally reviewed and have noted: 1) the patient's medical and social history 2) The patient's current medications and supplements 3) The patient's height, weight, and BMI have been recorded in the chart

## 2021-02-26 NOTE — Assessment & Plan Note (Signed)
Lab Results  Component Value Date   CREATININE 1.42 02/16/2021   Stable overall, cont to avoid nephrotoxins

## 2021-02-26 NOTE — Assessment & Plan Note (Addendum)
Chronic persistent uncontrolled, pt taking ibuprofen bid in the setting of ckd; declines pain management referral, to f/u with neurology as planned, also add protonix 40 qd preventive

## 2021-02-26 NOTE — Assessment & Plan Note (Signed)
Lab Results  Component Value Date   HGBA1C 5.9 10/21/2019   Stable, pt to continue current medical treatment  - diet

## 2021-02-26 NOTE — Assessment & Plan Note (Signed)
BP Readings from Last 3 Encounters:  02/22/21 126/74  10/04/20 130/80  06/17/20 120/80   Stable, pt to continue medical treatment toprol , norvasc

## 2021-02-26 NOTE — Assessment & Plan Note (Signed)
Lab Results  Component Value Date   LDLCALC 58 02/16/2021   Stable, pt to continue current statin pravachol

## 2021-03-03 ENCOUNTER — Ambulatory Visit: Payer: 59 | Admitting: Diagnostic Neuroimaging

## 2021-03-09 ENCOUNTER — Encounter: Payer: Self-pay | Admitting: *Deleted

## 2021-03-14 ENCOUNTER — Ambulatory Visit: Payer: 59 | Admitting: Diagnostic Neuroimaging

## 2021-03-14 ENCOUNTER — Encounter: Payer: Self-pay | Admitting: Internal Medicine

## 2021-03-21 ENCOUNTER — Other Ambulatory Visit: Payer: Self-pay | Admitting: Internal Medicine

## 2021-03-30 ENCOUNTER — Encounter: Payer: Self-pay | Admitting: Internal Medicine

## 2021-03-30 ENCOUNTER — Telehealth: Payer: 59 | Admitting: Internal Medicine

## 2021-03-30 DIAGNOSIS — J209 Acute bronchitis, unspecified: Secondary | ICD-10-CM

## 2021-03-30 MED ORDER — AMOXICILLIN 875 MG PO TABS: 875.0000 mg | ORAL_TABLET | Freq: Two times a day (BID) | ORAL | 0 refills | Status: DC

## 2021-03-30 NOTE — Assessment & Plan Note (Signed)
The patient will use over-the-counter cough and cold meds.  We emailed a prescription for amoxicillin in case he gets worse.

## 2021-03-30 NOTE — Progress Notes (Signed)
Virtual Visit via Video Note  I connected with Brandon Sanders on 03/30/21 at  1:40 PM EDT by a video enabled telemedicine application and verified that I am speaking with the correct person using two identifiers.   I discussed the limitations of evaluation and management by telemedicine and the availability of in person appointments. The patient expressed understanding and agreed to proceed.  I was located at our [facility] office. The patient was at home. There was no one else present in the visit.   History of Present Illness: The patient is complaining of on cough and congestion since Sunday.  His daughter was sick too.  They both tested negative for COVID.  His cough is mildly productive.  It is a little better now.  In the past he used to take amoxicillin for bronchitis/sinusitis with good results.   Observations/Objective: The patient appears to be in no acute distress, looks well.  Assessment and Plan:  See my Assessment and Plan. Follow Up Instructions:    I discussed the assessment and treatment plan with the patient. The patient was provided an opportunity to ask questions and all were answered. The patient agreed with the plan and demonstrated an understanding of the instructions.   The patient was advised to call back or seek an in-person evaluation if the symptoms worsen or if the condition fails to improve as anticipated.  I provided face-to-face time during this encounter. We were at different locations.   Walker Kehr, MD

## 2021-06-06 ENCOUNTER — Other Ambulatory Visit: Payer: Self-pay | Admitting: Internal Medicine

## 2021-06-12 ENCOUNTER — Ambulatory Visit: Payer: 59 | Admitting: Internal Medicine

## 2021-06-12 ENCOUNTER — Encounter: Payer: Self-pay | Admitting: Internal Medicine

## 2021-06-12 ENCOUNTER — Other Ambulatory Visit: Payer: Self-pay

## 2021-06-12 VITALS — BP 148/76 | HR 79 | Temp 98.9°F | Ht 74.0 in | Wt 186.0 lb

## 2021-06-12 DIAGNOSIS — E7800 Pure hypercholesterolemia, unspecified: Secondary | ICD-10-CM | POA: Diagnosis not present

## 2021-06-12 DIAGNOSIS — R7989 Other specified abnormal findings of blood chemistry: Secondary | ICD-10-CM

## 2021-06-12 DIAGNOSIS — R350 Frequency of micturition: Secondary | ICD-10-CM | POA: Diagnosis not present

## 2021-06-12 DIAGNOSIS — E559 Vitamin D deficiency, unspecified: Secondary | ICD-10-CM

## 2021-06-12 DIAGNOSIS — I10 Essential (primary) hypertension: Secondary | ICD-10-CM

## 2021-06-12 DIAGNOSIS — R739 Hyperglycemia, unspecified: Secondary | ICD-10-CM

## 2021-06-12 DIAGNOSIS — Z0001 Encounter for general adult medical examination with abnormal findings: Secondary | ICD-10-CM | POA: Diagnosis not present

## 2021-06-12 DIAGNOSIS — M79642 Pain in left hand: Secondary | ICD-10-CM

## 2021-06-12 DIAGNOSIS — F5232 Male orgasmic disorder: Secondary | ICD-10-CM | POA: Diagnosis not present

## 2021-06-12 DIAGNOSIS — M79641 Pain in right hand: Secondary | ICD-10-CM

## 2021-06-12 DIAGNOSIS — R252 Cramp and spasm: Secondary | ICD-10-CM

## 2021-06-12 DIAGNOSIS — I700 Atherosclerosis of aorta: Secondary | ICD-10-CM | POA: Insufficient documentation

## 2021-06-12 DIAGNOSIS — N1831 Chronic kidney disease, stage 3a: Secondary | ICD-10-CM

## 2021-06-12 DIAGNOSIS — E538 Deficiency of other specified B group vitamins: Secondary | ICD-10-CM

## 2021-06-12 LAB — URINALYSIS, ROUTINE W REFLEX MICROSCOPIC
Bilirubin Urine: NEGATIVE
Hgb urine dipstick: NEGATIVE
Ketones, ur: NEGATIVE
Leukocytes,Ua: NEGATIVE
Nitrite: NEGATIVE
RBC / HPF: NONE SEEN (ref 0–?)
Specific Gravity, Urine: 1.01 (ref 1.000–1.030)
Total Protein, Urine: NEGATIVE
Urine Glucose: NEGATIVE
Urobilinogen, UA: 0.2 (ref 0.0–1.0)
pH: 6.5 (ref 5.0–8.0)

## 2021-06-12 LAB — CBC WITH DIFFERENTIAL/PLATELET
Basophils Absolute: 0 10*3/uL (ref 0.0–0.1)
Basophils Relative: 0.3 % (ref 0.0–3.0)
Eosinophils Absolute: 0.1 10*3/uL (ref 0.0–0.7)
Eosinophils Relative: 1 % (ref 0.0–5.0)
HCT: 45.2 % (ref 39.0–52.0)
Hemoglobin: 15.5 g/dL (ref 13.0–17.0)
Lymphocytes Relative: 16.9 % (ref 12.0–46.0)
Lymphs Abs: 1.2 10*3/uL (ref 0.7–4.0)
MCHC: 34.3 g/dL (ref 30.0–36.0)
MCV: 91.9 fl (ref 78.0–100.0)
Monocytes Absolute: 0.8 10*3/uL (ref 0.1–1.0)
Monocytes Relative: 11.3 % (ref 3.0–12.0)
Neutro Abs: 5.2 10*3/uL (ref 1.4–7.7)
Neutrophils Relative %: 70.5 % (ref 43.0–77.0)
Platelets: 278 10*3/uL (ref 150.0–400.0)
RBC: 4.92 Mil/uL (ref 4.22–5.81)
RDW: 14.9 % (ref 11.5–15.5)
WBC: 7.4 10*3/uL (ref 4.0–10.5)

## 2021-06-12 LAB — HEPATIC FUNCTION PANEL
ALT: 41 U/L (ref 0–53)
AST: 34 U/L (ref 0–37)
Albumin: 3.9 g/dL (ref 3.5–5.2)
Alkaline Phosphatase: 45 U/L (ref 39–117)
Bilirubin, Direct: 0.1 mg/dL (ref 0.0–0.3)
Total Bilirubin: 0.7 mg/dL (ref 0.2–1.2)
Total Protein: 6.1 g/dL (ref 6.0–8.3)

## 2021-06-12 LAB — PSA: PSA: 1.15 ng/mL (ref 0.10–4.00)

## 2021-06-12 LAB — LIPID PANEL
Cholesterol: 185 mg/dL (ref 0–200)
HDL: 53.4 mg/dL (ref >39.00)
LDL Cholesterol: 101 mg/dL — ABNORMAL HIGH (ref 0–99)
NonHDL: 131.84
Total CHOL/HDL Ratio: 3
Triglycerides: 156 mg/dL — ABNORMAL HIGH (ref 0.0–149.0)
VLDL: 31.2 mg/dL (ref 0.0–40.0)

## 2021-06-12 LAB — BASIC METABOLIC PANEL
BUN: 23 mg/dL (ref 6–23)
CO2: 29 mEq/L (ref 19–32)
Calcium: 10 mg/dL (ref 8.4–10.5)
Chloride: 101 mEq/L (ref 96–112)
Creatinine, Ser: 1.3 mg/dL (ref 0.40–1.50)
GFR: 58.36 mL/min — ABNORMAL LOW (ref >60.00)
Glucose, Bld: 106 mg/dL — ABNORMAL HIGH (ref 70–99)
Potassium: 4.3 mEq/L (ref 3.5–5.1)
Sodium: 138 mEq/L (ref 135–145)

## 2021-06-12 LAB — VITAMIN D 25 HYDROXY (VIT D DEFICIENCY, FRACTURES): VITD: 41.18 ng/mL (ref 30.00–100.00)

## 2021-06-12 LAB — HEMOGLOBIN A1C: Hgb A1c MFr Bld: 6.1 % (ref 4.6–6.5)

## 2021-06-12 LAB — VITAMIN B12: Vitamin B-12: 751 pg/mL (ref 211–911)

## 2021-06-12 LAB — TSH: TSH: 2.77 u[IU]/mL (ref 0.35–5.50)

## 2021-06-12 MED ORDER — NYSTATIN 100000 UNIT/ML MT SUSP: 500000.0000 [IU] | Freq: Four times a day (QID) | OROMUCOSAL | 5 refills | Status: AC

## 2021-06-12 NOTE — Assessment & Plan Note (Signed)
BP Readings from Last 3 Encounters:  06/12/21 (!) 148/76  02/22/21 126/74  10/04/20 130/80   Uncontrolled today, likely reactive,, pt to continue medical treatment norvasc, toprol as declines change

## 2021-06-12 NOTE — Patient Instructions (Signed)

## 2021-06-12 NOTE — Assessment & Plan Note (Signed)
Lab Results  Component Value Date   LDLCALC 101 (H) 06/12/2021   Goal ldl < 100, mild uncontrolled,  pt to continue current statin pravachol as declines changes

## 2021-06-12 NOTE — Progress Notes (Signed)
Patient ID: Brandon Sanders, male   DOB: 26-Jan-1956, 65 y.o.   MRN: [id number]        Chief Complaint: follow up possible thrush, recurring right sciatica, low b12 per neurology, elevated BP today, thryoid tesitng, and hand and feet cramping, uinary frquency       HPI:  Brandon Sanders is a 65 y.o. male here with c/o multiple issues, has had 3 wks onset off white tongue rash seems worse at night, wihtout pain and biotene not resolving.  Also has recent right sciatica followed per ortho, then [facility] [facility] with ESI now improved.  Saw neuro, who suggested per pt that b12 about 250 is low started b12 1000 mcg weekly for 3 months, pt not sure if needs to continue as this seems a bit moch.  Thinks he has lost recnelty, asking for thyroid testingk fees tired and exhausted at times.  BP usually < 140/90 elsewhere.  Also has recurring bilateral hand and feet cramping for several months for unclear reason.  Alse anorgasmia much more frequent lately, but no ED seems worsening overall since 2014 urologic surgury.  Also with several days urinary frequency, though Denies urinary symptoms such as dysuria, urgency, flank pain, hematuria or n/v, fever, chills.        Wt Readings from Last 3 Encounters:  06/12/21 186 lb (84.4 kg)  02/22/21 184 lb (83.5 kg)  10/04/20 188 lb (85.3 kg)   BP Readings from Last 3 Encounters:  06/12/21 (!) 148/76  02/22/21 126/74  10/04/20 130/80         Past Medical History:  Diagnosis Date   Carpal tunnel syndrome, bilateral    Chronic renal insufficiency    COLITIS 12/17/2007   DJD of shoulder    Erectile dysfunction    managed with injections; no response with Viagra/Cialis   HTN (hypertension)    Hyperlipidemia    PLANTAR FASCIITIS 09/05/2010   Renal cell cancer ([place])    currently in clinical trial using Afinitor   Solitary kidney, acquired    SYNCOPE 09/05/2010   Past Surgical History:  Procedure Laterality Date   NEPHRECTOMY     Right   s.p right CTS release      s/p right ulnar nerve release     VASECTOMY      reports that he has never smoked. He has never used smokeless tobacco. He reports that he does not drink alcohol and does not use drugs. family history includes Heart disease in an other family member; Hypertension (age of onset: 9) in his father; Liver cancer in his maternal grandmother; Stroke in his father; Transient ischemic attack in his father. Allergies  Allergen Reactions   Penicillins     The patient has taken amoxicillin in the past.  No problem with it   Current Outpatient Medications on File Prior to Visit  Medication Sig Dispense Refill   ALLERGY RELIEF 10 MG tablet TAKE 1 TABLET BY MOUTH  DAILY 90 tablet 0   amLODipine (NORVASC) 5 MG tablet TAKE 1 TABLET BY MOUTH  DAILY 90 tablet 3   aspirin 81 MG EC tablet Take 81 mg by mouth daily.     azelastine (OPTIVAR) 0.05 % ophthalmic solution Place 1 drop into both eyes 2 (two) times daily. 6 mL 5   fenofibrate 160 MG tablet TAKE 1 TABLET BY MOUTH  DAILY 90 tablet 3   fluticasone (FLONASE) 50 MCG/ACT nasal spray USE 2 SPRAYS IN BOTH  NOSTRILS DAILY 48 g 2  ketoconazole (NIZORAL) 2 % cream APPLY TO AFFECTED AREA EVERY DAY 30 g 2   metoprolol succinate (TOPROL-XL) 50 MG 24 hr tablet TAKE 1 AND 1/2 TABLETS BY  MOUTH DAILY 135 tablet 3   pravastatin (PRAVACHOL) 40 MG tablet TAKE 1 TABLET BY MOUTH  DAILY 90 tablet 3   No current facility-administered medications on file prior to visit.        ROS:  All others reviewed and negative.  Objective        PE:  BP (!) 148/76 (BP Location: Right Arm, Patient Position: Sitting, Cuff Size: Normal)   Pulse 79   Temp 98.9 F (37.2 C) (Oral)   Ht 6\' 2"  (1.88 m)   Wt 186 lb (84.4 kg)   SpO2 97%   BMI 23.88 kg/m                 Constitutional: Pt appears in NAD               HENT: Head: NCAT.                Right Ear: External ear normal.                 Left Ear: External ear normal.                Eyes: . Pupils are equal, round, and  reactive to light. Conjunctivae and EOM are normal               Nose: without d/c or deformity               Neck: Neck supple. Gross normal ROM               Cardiovascular: Normal rate and regular rhythm.                 Pulmonary/Chest: Effort normal and breath sounds without rales or wheezing.                Abd:  Soft, NT, ND, + BS, no organomegaly               Neurological: Pt is alert. At baseline orientation, motor grossly intact               Skin: Skin is warm. No rashes, no other new lesions, LE edema - none               Psychiatric: Pt behavior is normal without agitation   Micro: none  Cardiac tracings I have personally interpreted today:  none  Pertinent Radiological findings (summarize): none   Lab Results  Component Value Date   WBC 7.4 06/12/2021   HGB 15.5 06/12/2021   HCT 45.2 06/12/2021   PLT 278.0 06/12/2021   GLUCOSE 106 (H) 06/12/2021   CHOL 185 06/12/2021   TRIG 156.0 (H) 06/12/2021   HDL 53.40 06/12/2021   LDLDIRECT 90.2 10/27/2009   LDLCALC 101 (H) 06/12/2021   ALT 41 06/12/2021   AST 34 06/12/2021   NA 138 06/12/2021   K 4.3 06/12/2021   CL 101 06/12/2021   CREATININE 1.30 06/12/2021   BUN 23 06/12/2021   CO2 29 06/12/2021   TSH 2.77 06/12/2021   PSA 1.15 06/12/2021   INR 0.92 02/16/2011   HGBA1C 6.1 06/12/2021   MICROALBUR 1.1 04/05/2016   Assessment/Plan:  Brandon Sanders is a 65 y.o. White or Caucasian [1] male with  has a past medical history of Carpal  tunnel syndrome, bilateral, Chronic renal insufficiency, COLITIS (12/17/2007), DJD of shoulder, Erectile dysfunction, HTN (hypertension), Hyperlipidemia, PLANTAR FASCIITIS (09/05/2010), Renal cell cancer ([place]), Solitary kidney, acquired, and SYNCOPE (09/05/2010).  HYPERCHOLESTEROLEMIA Lab Results  Component Value Date   LDLCALC 101 (H) 06/12/2021   Goal ldl < 100, mild uncontrolled,  pt to continue current statin pravachol as declines changes   HTN (hypertension) BP Readings from Last  3 Encounters:  06/12/21 (!) 148/76  02/22/21 126/74  10/04/20 130/80   Uncontrolled today, likely reactive,, pt to continue medical treatment norvasc, toprol as declines change  CKD (chronic kidney disease) stage 3, GFR 30-59 ml/min (HCC) Lab Results  Component Value Date   CREATININE 1.30 06/12/2021   Stable overall, cont to avoid nephrotoxins   Cramps, extremity None today, but for magnesium with labs  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  Cathlean Cower, MD 06/12/2021 10:57 PM [facility]

## 2021-06-12 NOTE — Assessment & Plan Note (Signed)
Suspect worsening overall realted to to prior urologic surgury - pt to f/u urology

## 2021-06-12 NOTE — Assessment & Plan Note (Signed)
Lab Results  Component Value Date   CREATININE 1.30 06/12/2021   Stable overall, cont to avoid nephrotoxins  

## 2021-06-12 NOTE — Assessment & Plan Note (Signed)
None today, but for magnesium with labs

## 2021-06-12 NOTE — Assessment & Plan Note (Signed)
Etiology unclaerar, for UA with labs

## 2021-06-12 NOTE — Assessment & Plan Note (Signed)
Lab Results  Component Value Date   [id number] 269 06/12/2021   Stable, cont oral replacement - b12 1000 mcg qd

## 2021-06-19 ENCOUNTER — Other Ambulatory Visit: Payer: Self-pay

## 2021-06-19 ENCOUNTER — Ambulatory Visit: Payer: 59 | Admitting: Orthopedic Surgery

## 2021-06-19 ENCOUNTER — Encounter: Payer: Self-pay | Admitting: Orthopedic Surgery

## 2021-06-19 VITALS — BP 150/87 | HR 84 | Ht 74.0 in | Wt 186.0 lb

## 2021-06-19 DIAGNOSIS — R252 Cramp and spasm: Secondary | ICD-10-CM | POA: Diagnosis not present

## 2021-06-19 NOTE — Progress Notes (Signed)
Office Visit Note   Patient: Brandon Sanders           Date of Birth: Oct 15, 1955           MRN: [id number] Visit Date: 06/19/2021              Requested by: Biagio Borg, MD [place],  [place] [id number] PCP: Biagio Borg, MD   Assessment & Plan: Visit Diagnoses:  1. Cramps, extremity     Plan: Discussed with patient the nature of muscle cramping.  He was previously diagnosed with R CTS and underwent R CTR.  He was having numbness and paresthesias in his hand which resolved after surgery.  These are not the symptoms that he's been having lately.  He does not have any finger triggering. We discussed prevention of muscle cramps with adequate hydration, vitamin supplementation, and stretching.  We discussed the potential role for hand therapy in hand strengthening.  He is going to continue to monitor his symptoms and follow up again in several months if he's still having issues or develops new symptoms.   Follow-Up Instructions: No follow-ups on file.   Orders:  No orders of the defined types were placed in this encounter.  No orders of the defined types were placed in this encounter.     Procedures: No procedures performed   Clinical Data: No additional findings.   Subjective: Chief Complaint  Patient presents with   Right Hand - Pain    RIGHT hand Dom   Left Hand - Pain    This is a 65 yo RHD M who presents w/ bilateral hand cramping for several months.  He is also having similar problems in his feet and toes.  He notes that his index and middle fingers will adduct and thumb will flex and "lock up" while he's doing certain activities like using a fork, painting, or doing manual labor.  He denies any numbness or paresthesias.  He had severe numbness and tingling prior to his CTR on the R but they resolved after surgery.  He was seen by his PCP who obtained a CMP which was largely normal.  He has no history of neuromuscular disorders. He denies any symptoms of  finger triggering.     Review of Systems  Constitutional: Negative.   Respiratory: Negative.    Cardiovascular: Negative.   Skin: Negative.     Objective: Vital Signs: BP (!) 150/87 (BP Location: Left Arm, Patient Position: Sitting)   Pulse 84   Ht 6\' 2"  (1.88 m)   Wt 186 lb (84.4 kg)   BMI 23.88 kg/m   Physical Exam Constitutional:      Appearance: Normal appearance.  Cardiovascular:     Pulses: Normal pulses.  Pulmonary:     Effort: Pulmonary effort is normal.  Skin:    General: Skin is warm and dry.     Capillary Refill: Capillary refill takes less than 2 seconds.  Neurological:     Mental Status: He is alert.    Right Hand Exam   Tenderness  The patient is experiencing no tenderness.   Range of Motion  The patient has normal right wrist ROM.   Muscle Strength  The patient has normal right wrist strength.  Other  Sensation: normal Pulse: present  Comments:  No palpable triggering.  Negative Tinel at wrist.  Negative carpal tunnel compression test.    Left Hand Exam   Tenderness  The patient is experiencing no tenderness.  Range of Motion  The patient has normal left wrist ROM.  Muscle Strength  The patient has normal left wrist strength.  Other  Sensation: normal Pulse: present  Comments:  No palpable triggering.  Negative Tinel at wrist.  Negative carpal tunnel compression test.      Specialty Comments:  No specialty comments available.  Imaging: No results found.   PMFS History: Patient Active Problem List   Diagnosis Date Noted   Aortic atherosclerosis ([place]) 06/12/2021   Cramps, extremity 06/12/2021   Anorgasmia of male 06/12/2021   Low vitamin B12 level 06/12/2021   Urinary frequency 06/12/2021   High risk medication use 02/22/2021   Renal cell carcinoma ([place]) 02/22/2021   Spinal stenosis of lumbar region 12/04/2020   Lumbar radiculopathy 11/21/2020   Right leg pain 10/09/2020   Low back pain 10/06/2020   Trigeminal  neuralgia 06/17/2020   Dyshidrotic eczema 10/28/2019   Peyronie disease 05/21/2019   LLQ pain 06/19/2018   Dysfunction of left eustachian tube 06/19/2018   Bilateral hearing loss 04/30/2018   DDD (degenerative disc disease), cervical 09/19/2017   Cervical radiculopathy 09/19/2017   Rash 04/18/2017   Allergic rhinitis 04/18/2017   Right arm pain 04/16/2017   Left facial pain 03/15/2017   Left ear hearing loss 04/12/2016   Ventral hernia 04/12/2016   Hyperglycemia 04/12/2016   Chest pain on breathing 12/20/2015   Cough 10/24/2015   Leg cramps 10/24/2015   Abdominal wall hernia 10/03/2014   Prostate nodule without urinary obstruction 03/04/2014   Acute bronchitis 01/28/2014   Atypical facial pain 07/07/2013   Incisional hernia 06/09/2013   Acute postoperative pain of abdomen 08/18/2012   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   CKD (chronic kidney disease) stage 3, GFR 30-59 ml/min ([place]) 12/26/2010   Encounter for well adult exam with abnormal findings 12/22/2010   Erectile dysfunction    Carpal tunnel syndrome, bilateral    Solitary kidney, acquired    DJD of shoulder    PLANTAR FASCIITIS 09/05/2010   SYNCOPE 09/05/2010   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[place]) 10/28/2008   COLITIS 12/17/2007   HTN (hypertension) 10/27/2007   HYPERCHOLESTEROLEMIA 06/24/2007   ERECTILE DYSFUNCTION 06/24/2007   Past Medical History:  Diagnosis Date   Carpal tunnel syndrome, bilateral    Chronic renal insufficiency    COLITIS 12/17/2007   DJD of shoulder    Erectile dysfunction    managed with injections; no response with Viagra/Cialis   HTN (hypertension)    Hyperlipidemia    PLANTAR FASCIITIS 09/05/2010   Renal cell cancer (HCC)    currently in clinical trial using Afinitor   Solitary kidney, acquired    SYNCOPE 09/05/2010    Family History  Problem Relation Age of Onset   Stroke Father    Transient ischemic attack Father    Hypertension  Father 32       CABG   Liver cancer Maternal Grandmother    Heart disease Other     Past Surgical History:  Procedure Laterality Date   NEPHRECTOMY     Right   s.p right CTS release     s/p right ulnar nerve release     VASECTOMY     Social History   Occupational History   Occupation: Scientist, clinical (histocompatibility and immunogenetics): [employer]  Tobacco Use   Smoking status: Never   Smokeless tobacco: Never   Tobacco comments:    quit 25 yrs ago  Substance and Sexual  Activity   Alcohol use: No   Drug use: No   Sexual activity: Yes

## 2021-06-23 ENCOUNTER — Encounter: Payer: Self-pay | Admitting: Internal Medicine

## 2021-07-03 ENCOUNTER — Other Ambulatory Visit: Payer: Self-pay | Admitting: Internal Medicine

## 2021-07-03 ENCOUNTER — Telehealth: Payer: Self-pay | Admitting: Internal Medicine

## 2021-07-03 DIAGNOSIS — E7800 Pure hypercholesterolemia, unspecified: Secondary | ICD-10-CM

## 2021-07-03 NOTE — Telephone Encounter (Signed)
Spoke with Nunzio Cobbs with [facility] and provided diagnosis code E78.00 Hypercholesterolemia.

## 2021-07-03 NOTE — Telephone Encounter (Signed)
Taryn from [facility] has called requesting a diagnostic code from calcium score order for pt.    Callback #- [phone]

## 2021-07-24 ENCOUNTER — Other Ambulatory Visit: Payer: Self-pay | Admitting: Internal Medicine

## 2021-07-24 ENCOUNTER — Ambulatory Visit (INDEPENDENT_AMBULATORY_CARE_PROVIDER_SITE_OTHER)
Admission: RE | Admit: 2021-07-24 | Discharge: 2021-07-24 | Disposition: A | Discharge status: Home / Self Care | Payer: Self-pay | Source: Ambulatory Visit | Attending: Internal Medicine | Admitting: Internal Medicine

## 2021-07-24 ENCOUNTER — Other Ambulatory Visit: Payer: Self-pay

## 2021-07-24 ENCOUNTER — Encounter: Payer: Self-pay | Admitting: Internal Medicine

## 2021-07-24 DIAGNOSIS — R931 Abnormal findings on diagnostic imaging of heart and coronary circulation: Secondary | ICD-10-CM

## 2021-07-24 DIAGNOSIS — E7800 Pure hypercholesterolemia, unspecified: Secondary | ICD-10-CM

## 2021-07-25 NOTE — Telephone Encounter (Signed)
See result note; reviewed results with pateint

## 2021-07-27 ENCOUNTER — Ambulatory Visit: Payer: 59 | Admitting: Cardiology

## 2021-08-01 ENCOUNTER — Other Ambulatory Visit: Payer: Self-pay

## 2021-08-01 ENCOUNTER — Encounter: Payer: Self-pay | Admitting: Internal Medicine

## 2021-08-01 ENCOUNTER — Ambulatory Visit: Payer: 59 | Admitting: Internal Medicine

## 2021-08-01 VITALS — BP 132/70 | HR 87 | Resp 20 | Ht 74.0 in | Wt 189.4 lb

## 2021-08-01 DIAGNOSIS — R9431 Abnormal electrocardiogram [ECG] [EKG]: Secondary | ICD-10-CM

## 2021-08-01 DIAGNOSIS — R931 Abnormal findings on diagnostic imaging of heart and coronary circulation: Secondary | ICD-10-CM

## 2021-08-01 MED ORDER — METOPROLOL SUCCINATE ER 50 MG PO TB24: 100.0000 mg | ORAL_TABLET | Freq: Every day | ORAL | 0 refills | Status: DC

## 2021-08-01 MED ORDER — PRAVASTATIN SODIUM 80 MG PO TABS: 80.0000 mg | ORAL_TABLET | Freq: Every day | ORAL | 3 refills | Status: DC

## 2021-08-01 NOTE — Progress Notes (Addendum)
Cardiology Office Note:    Date:  08/01/2021   ID:  NILES ESS, DOB [birth year], MRN [id number]  PCP:  Biagio Borg, MD   [facility] Providers Cardiologist:  Janina Mayo, MD     Referring MD: Biagio Borg, MD   No chief complaint on file. High CAC  History of Present Illness:    Brandon Sanders is a 65 y.o. male with a hx of HTN, CKD, RCC, HLD CAC 2052, predominant RCA lesion  His father has had heart disease at 38 years old and passed of heart failure. He had bypass surgery. He had a stroke.  He has had high cholesterol. He started taking metoprolol for prevention purposes. He has two PCPs. One with [facility]. He is exhausted. He was offered the CAC scoring. He has not felt good in hears. He denies chest pain , no exertional dyspnea. He averages 5,000 steps a day. Her blood pressure is fairly well controlled 132/70 mmHg. He denies LH, dizziness, or syncope. No orthopnea, PND, no LE edema.   He has history of RCC on afinitor. He stopped it as to not wanting to be immunosuppressed. He took it for 8 years.  He was seen by Dr. Johnsie Cancel in 01/2011 for palpitations. He had a holter that showed PAC, and PVCs that were benign. He was started on metoprolol and noted dosing up to 100 mg XL. He had a myovue and echo that January that was normal.   Family Hx : mother had diabetes. Father had heart disease per above.  LDL 101 mg/dL TC 185 mg/dL  CVD Risk/Equivalent: HLD- yes HTN- yes PAD- No DMII- No Smoker-No Stroke-No Premature Family History- Yes  07/24/2021 FINDINGS: Non-cardiac: See separate report from [facility].   Ascending Aorta: Normal caliber.  Aortic atherosclerosis.   Aortic Valve: Aortic valve calcium score 419.   Pericardium: Normal.   Coronary arteries: Normal origins.   Coronary Calcium Score:   Left main: 36   Left anterior descending artery: 616   Left circumflex artery: 303   Right coronary artery: 1097   Total: 2052  Past  Medical History:  Diagnosis Date   Carpal tunnel syndrome, bilateral    Chronic renal insufficiency    COLITIS 12/17/2007   DJD of shoulder    Erectile dysfunction    managed with injections; no response with Viagra/Cialis   HTN (hypertension)    Hyperlipidemia    PLANTAR FASCIITIS 09/05/2010   Renal cell cancer ([place])    currently in clinical trial using Afinitor   Solitary kidney, acquired    SYNCOPE 09/05/2010    Past Surgical History:  Procedure Laterality Date   NEPHRECTOMY     Right   s.p right CTS release     s/p right ulnar nerve release     VASECTOMY      Current Medications: Current Meds  Medication Sig   ALLERGY RELIEF 10 MG tablet TAKE 1 TABLET BY MOUTH  DAILY   amLODipine (NORVASC) 5 MG tablet TAKE 1 TABLET BY MOUTH  DAILY   aspirin 81 MG EC tablet Take 81 mg by mouth daily.   azelastine (OPTIVAR) 0.05 % ophthalmic solution Place 1 drop into both eyes 2 (two) times daily.   cyanocobalamin (,VITAMIN B-12,) 1000 MCG/ML injection Inject into the muscle. weekly   fenofibrate 160 MG tablet TAKE 1 TABLET BY MOUTH  DAILY   fluticasone (FLONASE) 50 MCG/ACT nasal spray USE 2 SPRAYS IN BOTH  NOSTRILS DAILY  ketoconazole (NIZORAL) 2 % cream APPLY TO AFFECTED AREA EVERY DAY   [DISCONTINUED] metoprolol succinate (TOPROL-XL) 50 MG 24 hr tablet TAKE 1 AND 1/2 TABLETS BY  MOUTH DAILY   [DISCONTINUED] pravastatin (PRAVACHOL) 40 MG tablet TAKE 1 TABLET BY MOUTH  DAILY     Allergies:   Penicillins   Social History   Socioeconomic History   Marital status: Married    Spouse name: Not on file   Number of children: 3   Years of education: Not on file   Highest education level: Not on file  Occupational History   Occupation: Scientist, clinical (histocompatibility and immunogenetics): [employer]  Tobacco Use   Smoking status: Never   Smokeless tobacco: Never   Tobacco comments:    quit 25 yrs ago  Substance and Sexual Activity   Alcohol use: No   Drug use: No   Sexual activity: Yes  Other Topics  Concern   Not on file  Social History Narrative   Lives with wife   Social Determinants of Health   Financial Resource Strain: Not on file  Food Insecurity: Not on file  Transportation Needs: Not on file  Physical Activity: Not on file  Stress: Not on file  Social Connections: Not on file     Family History: The patient's family history includes Heart disease in an other family member; Hypertension (age of onset: 45) in his father; Liver cancer in his maternal grandmother; Stroke in his father; Transient ischemic attack in his father.  ROS:   Please see the history of present illness.     All other systems reviewed and are negative.  EKGs/Labs/Other Studies Reviewed:    The following studies were reviewed today:   EKG:  EKG is  ordered today.  The ekg ordered today demonstrates   NSR, [place] PVCs  Recent Labs: 06/12/2021: ALT 41; BUN 23; Creatinine, Ser 1.30; Hemoglobin 15.5; Platelets 278.0; Potassium 4.3; Sodium 138; TSH 2.77  Recent Lipid Panel    Component Value Date/Time   CHOL 185 06/12/2021 0851   TRIG 156.0 (H) 06/12/2021 0851   HDL 53.40 06/12/2021 0851   CHOLHDL 3 06/12/2021 0851   VLDL 31.2 06/12/2021 0851   LDLCALC 101 (H) 06/12/2021 0851   LDLDIRECT 90.2 10/27/2009 0722     Risk Assessment/Calculations:    The 10-year ASCVD risk score (Arnett DK, et al., 2019) is: 12.1%   Values used to calculate the score:     Age: 35 years     Sex: Male     Is Non-Hispanic African American: No     Diabetic: No     Tobacco smoker: No     Systolic Blood Pressure: 539 mmHg     Is BP treated: Yes     HDL Cholesterol: 53.4 mg/dL     Total Cholesterol: 185 mg/dL        Physical Exam:    VS:  BP 132/70 (BP Location: Left Arm, Patient Position: Sitting, Cuff Size: Normal)   Pulse 87   Resp 20   Ht 6\' 2"  (1.88 m)   Wt 189 lb 6.4 oz (85.9 kg)   SpO2 96%   BMI 24.32 kg/m     Wt Readings from Last 3 Encounters:  08/01/21 189 lb 6.4 oz (85.9 kg)  06/19/21  186 lb (84.4 kg)  06/12/21 186 lb (84.4 kg)     GEN:  Well nourished, well developed in no acute distress HEENT: Normal NECK: No JVD; No carotid bruits LYMPHATICS: No  lymphadenopathy CARDIAC: RRR, no murmurs, rubs, gallops RESPIRATORY:  Clear to auscultation without rales, wheezing or rhonchi  ABDOMEN: Soft, non-tender, non-distended MUSCULOSKELETAL:  No edema; No deformity  SKIN: Warm and dry NEUROLOGIC:  Alert and oriented x 3 PSYCHIATRIC:  Normal affect   ASSESSMENT:    #High CAC:His CAC is very high and he is at intermediate risk for CVD. He also has family hx of premature CAD. Further, his PVCs are more frequent. And PVC morphology in lead III is different than prior. Will opt for ischemic work up at this time as well as an echo to assess LV function with ongoing PVCs. - increase pravastatin 80 mg - TTE - treadmill nuclear stress  #Cardio-Oncology: Now off everolimus. No known high risk for cardiotoxicity   PLAN:    In order of problems listed above:  Exercise NM SPECT Echocardiogram Follow up 3 months Lipid profile in 3 months Increase metoprolol 100 mg daily      Shared Decision Making/Informed Consent The risks [chest pain, shortness of breath, cardiac arrhythmias, dizziness, blood pressure fluctuations, myocardial infarction, stroke/transient ischemic attack, nausea, vomiting, allergic reaction, radiation exposure, metallic taste sensation and life-threatening complications (estimated to be 1 in 10,000)], benefits (risk stratification, diagnosing coronary artery disease, treatment guidance) and alternatives of a nuclear stress test were discussed in detail with Mr. Schrieber and he agrees to proceed.    Medication Adjustments/Labs and Tests Ordered: Current medicines are reviewed at length with the patient today.  Concerns regarding medicines are outlined above.    Signed, Janina Mayo, MD  08/01/2021 3:28 PM    [facility]

## 2021-08-01 NOTE — Patient Instructions (Addendum)
Medication Instructions:  START: Pravastatin 80mg  ONCE DAILY   INCREASE: METOPROLOL SUCCINATE TO 100mg  ONCE DAILY  *If you need a refill on your cardiac medications before your next appointment, please call your pharmacy*  Lab Work: LIPIDS IN 3 MONTHS- please return for this right before your next appointment. No lab appointment needed. Lab hours are Monday-Friday from 8am-4pm. If you have labs (blood work) drawn today and your tests are completely normal, you will receive your results only by: [place] (if you have MyChart) OR A paper copy in the mail If you have any lab test that is abnormal or we need to change your treatment, we will call you to review the results.  Testing/Procedures: Your physician has requested that you have an echocardiogram. Echocardiography is a painless test that uses sound waves to create images of your heart. It provides your doctor with information about the size and shape of your heart and how well your heart's chambers and valves are working. You may receive an ultrasound enhancing agent through an IV if needed to better visualize your heart during the echo.This procedure takes approximately one hour. There are no restrictions for this procedure. This will take place at the [address]   Your physician has requested that you have en exercise stress myoview. For further information please visit [email or web address]. Please follow instruction sheet, as given. This will take place at [address]  Follow-Up: At [facility], you and your health needs are our priority.  As part of our continuing mission to provide you with exceptional heart care, we have created designated Provider Care Teams.  These Care Teams include your primary Cardiologist (physician) and Advanced Practice Providers (APPs -  Physician Assistants and Nurse Practitioners) who all work together to provide you with the care you need, when you need it.  Your  next appointment:   3 MONTHS   The format for your next appointment:   In Person  Provider:   Janina Mayo, MD

## 2021-08-02 NOTE — Addendum Note (Signed)
Addended byPhineas Inches on: 08/02/2021 09:34 AM   Modules accepted: Orders

## 2021-08-07 DIAGNOSIS — M79642 Pain in left hand: Secondary | ICD-10-CM | POA: Insufficient documentation

## 2021-08-09 ENCOUNTER — Telehealth (HOSPITAL_COMMUNITY): Payer: Self-pay | Admitting: Radiology

## 2021-08-09 NOTE — Telephone Encounter (Signed)
Patient given detailed instructions per Myocardial Perfusion Study Information Sheet for the test on 12/01 at 7:15. Patient notified to arrive 15 minutes early and that it is imperative to arrive on time for appointment to keep from having the test rescheduled.  If you need to cancel or reschedule your appointment, please call the office within 24 hours of your appointment. . Patient verbalized understanding.EHK

## 2021-08-10 ENCOUNTER — Other Ambulatory Visit: Payer: Self-pay

## 2021-08-10 ENCOUNTER — Ambulatory Visit (HOSPITAL_COMMUNITY): Payer: 59 | Attending: Cardiovascular Disease

## 2021-08-10 DIAGNOSIS — R9431 Abnormal electrocardiogram [ECG] [EKG]: Secondary | ICD-10-CM | POA: Diagnosis not present

## 2021-08-10 LAB — MYOCARDIAL PERFUSION IMAGING
Angina Index: 0
Duke Treadmill Score: 8
Estimated workload: 9.8
Exercise duration (min): 7 min
Exercise duration (sec): 50 s
LV dias vol: 77 mL (ref 62–150)
LV sys vol: 30 mL
MPHR: 157 {beats}/min
Nuc Stress EF: 61 %
Peak HR: 137 {beats}/min
Percent HR: 87 %
Rest HR: 70 {beats}/min
Rest Nuclear Isotope Dose: 10.3 mCi
SDS: 0
SRS: 0
SSS: 0
ST Depression (mm): 0 mm
Stress Nuclear Isotope Dose: 32.4 mCi
TID: 0.81

## 2021-08-10 MED ORDER — TECHNETIUM TC 99M TETROFOSMIN IV KIT
32.4000 | PACK | Freq: Once | INTRAVENOUS | Status: AC | PRN
  Administered 2021-08-10: 32.4 via INTRAVENOUS
  Filled 2021-08-10: qty 33

## 2021-08-10 MED ORDER — TECHNETIUM TC 99M TETROFOSMIN IV KIT
10.3000 | PACK | Freq: Once | INTRAVENOUS | Status: AC | PRN
  Administered 2021-08-10: 10.3 via INTRAVENOUS
  Filled 2021-08-10: qty 11

## 2021-08-18 ENCOUNTER — Ambulatory Visit: Payer: 59 | Admitting: Cardiovascular Disease

## 2021-08-22 ENCOUNTER — Other Ambulatory Visit: Payer: Self-pay

## 2021-08-22 ENCOUNTER — Ambulatory Visit (HOSPITAL_COMMUNITY): Payer: 59 | Attending: Internal Medicine

## 2021-08-22 DIAGNOSIS — R931 Abnormal findings on diagnostic imaging of heart and coronary circulation: Secondary | ICD-10-CM | POA: Insufficient documentation

## 2021-08-22 DIAGNOSIS — R9431 Abnormal electrocardiogram [ECG] [EKG]: Secondary | ICD-10-CM | POA: Diagnosis not present

## 2021-08-22 LAB — ECHOCARDIOGRAM COMPLETE
Area-P 1/2: 3.42 cm2
S' Lateral: 3.4 cm

## 2021-09-15 ENCOUNTER — Other Ambulatory Visit: Payer: Self-pay | Admitting: Internal Medicine

## 2021-09-16 NOTE — Telephone Encounter (Signed)
Please refill as per office routine med refill policy (all routine meds to be refilled for 3 mo or monthly (per pt preference) up to one year from last visit, then month to month grace period for 3 mo, then further med refills will have to be denied) ? ?

## 2021-09-18 ENCOUNTER — Other Ambulatory Visit: Payer: Self-pay | Admitting: Internal Medicine

## 2021-09-18 MED ORDER — METOPROLOL SUCCINATE ER 100 MG PO TB24: 100.0000 mg | ORAL_TABLET | Freq: Every day | ORAL | 3 refills | Status: DC

## 2021-09-18 MED ORDER — PRAVASTATIN SODIUM 80 MG PO TABS: 80.0000 mg | ORAL_TABLET | Freq: Every day | ORAL | 3 refills | Status: DC

## 2021-10-10 ENCOUNTER — Other Ambulatory Visit: Payer: Self-pay | Admitting: Internal Medicine

## 2021-11-07 ENCOUNTER — Other Ambulatory Visit: Payer: 59 | Admitting: *Deleted

## 2021-11-07 ENCOUNTER — Other Ambulatory Visit: Payer: Self-pay

## 2021-11-07 ENCOUNTER — Other Ambulatory Visit: Payer: 59

## 2021-11-07 DIAGNOSIS — R9431 Abnormal electrocardiogram [ECG] [EKG]: Secondary | ICD-10-CM

## 2021-11-07 DIAGNOSIS — R931 Abnormal findings on diagnostic imaging of heart and coronary circulation: Secondary | ICD-10-CM

## 2021-11-07 LAB — LIPID PANEL
Chol/HDL Ratio: 3.8 ratio (ref 0.0–5.0)
Cholesterol, Total: 175 mg/dL (ref 100–199)
HDL: 46 mg/dL (ref >39)
LDL Chol Calc (NIH): 108 mg/dL — ABNORMAL HIGH (ref 0–99)
Triglycerides: 118 mg/dL (ref 0–149)
VLDL Cholesterol Cal: 21 mg/dL (ref 5–40)

## 2021-11-08 ENCOUNTER — Telehealth: Payer: Self-pay | Admitting: Internal Medicine

## 2021-11-08 ENCOUNTER — Ambulatory Visit (INDEPENDENT_AMBULATORY_CARE_PROVIDER_SITE_OTHER): Payer: 59

## 2021-11-08 ENCOUNTER — Encounter: Payer: Self-pay | Admitting: Internal Medicine

## 2021-11-08 ENCOUNTER — Ambulatory Visit: Payer: 59 | Admitting: Internal Medicine

## 2021-11-08 VITALS — BP 130/78 | HR 65 | Ht 74.0 in | Wt 189.2 lb

## 2021-11-08 DIAGNOSIS — R002 Palpitations: Secondary | ICD-10-CM

## 2021-11-08 MED ORDER — EZETIMIBE 10 MG PO TABS: 10.0000 mg | ORAL_TABLET | Freq: Every day | ORAL | 3 refills | Status: DC

## 2021-11-08 NOTE — Progress Notes (Unsigned)
Enrolled patient for a 7 day Zio XT monitor to be mailed to patients home.  

## 2021-11-08 NOTE — Telephone Encounter (Signed)
? ? ?*  STAT* If patient is at the pharmacy, call can be transferred to refill team. ? ? ?1. Which medications need to be refilled? (please list name of each medication and dose if known) ezetimibe (ZETIA) 10 MG tablet ? ?2. Which pharmacy/location (including street and city if local pharmacy) is medication to be sent to? [facility] ([place], [place] ? ?3. Do they need a 30 day or 90 day supply? 90 days ? ?Pt wants this prescription [facility] to [facility]  ?

## 2021-11-08 NOTE — Patient Instructions (Signed)
Medication Instructions:  ?START: ZETIA 10mg  ONCE DAILY  ?*If you need a refill on your cardiac medications before your next appointment, please call your pharmacy* ? ?Testing/Procedures: ? ?ZIO XT- Long Term Monitor Instructions  ? ?Your physician has requested you wear your ZIO patch monitor___7____days.  ? ?This is a single patch monitor.  [employer] supplies one patch monitor per enrollment.  Additional stickers are not available. ?  ?Please do not apply patch if you will be having a Nuclear Stress Test, Echocardiogram, Cardiac CT, MRI, or Chest Xray during the time frame you would be wearing the monitor. The patch cannot be worn during these tests.  You cannot remove and re-apply the ZIO XT patch monitor. ?  ?Your ZIO patch monitor will be sent USPS Priority mail from [facility] directly to your home address. The monitor may also be mailed to a PO BOX if home delivery is not available.   It may take 3-5 days to receive your monitor after you have been enrolled. ?  ?Once you have received you monitor, please review enclosed instructions.  Your monitor has already been registered assigning a specific monitor serial # to you. ?  ?Applying the monitor  ? ?Shave hair from upper left chest. ?  ?Hold abrader disc by orange tab.  Rub abrader in 40 strokes over left upper chest as indicated in your monitor instructions. ?  ?Clean area with 4 enclosed alcohol pads .  Use all pads to assure are is cleaned thoroughly.  Let dry.  ? ?Apply patch as indicated in monitor instructions.  Patch will be place under collarbone on left side of chest with arrow pointing upward. ?  ?Rub patch adhesive wings for 2 minutes.Remove white label marked "1".  Remove white label marked "2".  Rub patch adhesive wings for 2 additional minutes. ?  ?While looking in a mirror, press and release button in center of patch.  A small green light will flash 3-4 times .  This will be your only indicator the monitor has been turned on. ?    ?Do  not shower for the first 24 hours.  You may shower after the first 24 hours. ?  ?Press button if you feel a symptom. You will hear a small click.  Record Date, Time and Symptom in the Patient Log Book. ?  ?When you are ready to remove patch, follow instructions on last 2 pages of Patient Log Book.  Stick patch monitor onto last page of Patient Log Book. ?  ?Place Patient Log Book in [facility] box.  Use locking tab on box and tape box closed securely.  The [employer] has [employer] on it.  Please place in mailbox as soon as possible.  Your physician should have your test results approximately 7 days after the monitor has been mailed back to [facility]. ?  ?Call [facility] at [phone] if you have questions regarding your ZIO XT patch monitor.  Call them immediately if you see an orange light blinking on your monitor. ?  ?If your monitor falls off in less than 4 days contact our Monitor department at [phone].  If your monitor becomes loose or falls off after 4 days call [employer] at [phone] for suggestions on securing your monitor.  ? ?Follow-Up: ?At [facility], you and your health needs are our priority.  As part of our continuing mission to provide you with exceptional heart care, we have created designated Provider Care Teams.  These Care  Teams include your primary Cardiologist (physician) and Advanced Practice Providers (APPs -  Physician Assistants and Nurse Practitioners) who all work together to provide you with the care you need, when you need it. ? ?Your next appointment:   ?6 month(s) ? ?The format for your next appointment:   ?In Person ? ?Provider:   ?Janina Mayo, MD =  ? ?

## 2021-11-08 NOTE — Progress Notes (Signed)
Cardiology Office Note:    Date:  11/08/2021   ID:  Brandon Sanders, DOB 20-Nov-1955, MRN [id number]  PCP:  Biagio Borg, MD   [facility] Cardiologist:  Janina Mayo, MD     Referring MD: Biagio Borg, MD   No chief complaint on file.  High CAC  History of Present Illness:    Brandon Sanders is a 66 y.o. male with a hx of HTN, CKD, RCC, HLD CAC 2052, predominant RCA lesion  His father has had heart disease at 3 years old and passed of heart failure. He had bypass surgery. He had a stroke.  He has had high cholesterol. He started taking metoprolol for prevention purposes. He has two PCPs. One with [facility] and [facility]. He is exhausted. He was offered the CAC scoring. He has not felt good in hears. He denies chest pain , no exertional dyspnea. He averages 5,000 steps a day. Her blood pressure is fairly well controlled 132/70 mmHg. He denies LH, dizziness, or syncope. No orthopnea, PND, no LE edema.   He has history of RCC on afinitor. He stopped it as to not wanting to be immunosuppressed. He took it for 8 years.  He was seen by Dr. Johnsie Cancel in 01/2011 for palpitations. He had a holter that showed PAC, and PVCs that were benign. He was started on metoprolol and noted dosing up to 100 mg XL. He had a myovue and echo that January that was normal.   Family Hx : mother had diabetes. Father had heart disease per above.  LDL 101 mg/dL TC 185 mg/dL  CVD Risk/Equivalent: HLD- yes HTN- yes PAD- No DMII- No Smoker-No Stroke-No Premature Family History- Yes  Interim hx:  He notes frequent palpitations. He is worried something is wrong. It makes him feel anxious. He feels fatigued. He notes about 6 hours of sleep. He denies syncope. He denies [facility]    07/24/2021 FINDINGS: Non-cardiac: See separate report from [facility].   Ascending Aorta: Normal caliber.  Aortic atherosclerosis.   Aortic Valve: Aortic valve calcium score 419.   Pericardium: Normal.   Coronary  arteries: Normal origins.   Coronary Calcium Score:   Left main: 36   Left anterior descending artery: 616   Left circumflex artery: 303   Right coronary artery: 1097   Total: 2052  08/10/2021 Exercise Spect- METS 9, 87% MPHR. Low risk nuclear study  08/22/2021 TTE- normal EF, normal strain, no valve disease, no pulmonary htn   Past Medical History:  Diagnosis Date   Carpal tunnel syndrome, bilateral    Chronic renal insufficiency    COLITIS 12/17/2007   DJD of shoulder    Erectile dysfunction    managed with injections; no response with Viagra/Cialis   HTN (hypertension)    Hyperlipidemia    PLANTAR FASCIITIS 09/05/2010   Renal cell cancer ([place])    currently in clinical trial using Afinitor   Solitary kidney, acquired    SYNCOPE 09/05/2010    Past Surgical History:  Procedure Laterality Date   NEPHRECTOMY     Right   s.p right CTS release     s/p right ulnar nerve release     VASECTOMY      Current Medications: Current Meds  Medication Sig   ALLERGY RELIEF 10 MG tablet TAKE 1 TABLET BY MOUTH DAILY   amLODipine (NORVASC) 5 MG tablet TAKE 1 TABLET BY MOUTH  DAILY   aspirin 81 MG EC tablet Take 81 mg by  mouth daily.   azelastine (OPTIVAR) 0.05 % ophthalmic solution Place 1 drop into both eyes 2 (two) times daily.   cyanocobalamin (,VITAMIN B-12,) 1000 MCG/ML injection Inject into the muscle. weekly   fenofibrate 160 MG tablet TAKE 1 TABLET BY MOUTH  DAILY   fluticasone (FLONASE) 50 MCG/ACT nasal spray USE 2 SPRAYS IN BOTH  NOSTRILS DAILY   ketoconazole (NIZORAL) 2 % cream APPLY TO AFFECTED AREA EVERY DAY   metoprolol succinate (TOPROL-XL) 100 MG 24 hr tablet Take 1 tablet (100 mg total) by mouth daily.   pravastatin (PRAVACHOL) 80 MG tablet Take 1 tablet (80 mg total) by mouth daily.     Allergies:   Penicillins   Social History   Socioeconomic History   Marital status: Married    Spouse name: Not on file   Number of children: 3   Years of education:  Not on file   Highest education level: Not on file  Occupational History   Occupation: Scientist, clinical (histocompatibility and immunogenetics): [employer]  Tobacco Use   Smoking status: Never   Smokeless tobacco: Never   Tobacco comments:    quit 25 yrs ago  Substance and Sexual Activity   Alcohol use: No   Drug use: No   Sexual activity: Yes  Other Topics Concern   Not on file  Social History Narrative   Lives with wife   Social Determinants of Health   Financial Resource Strain: Not on file  Food Insecurity: Not on file  Transportation Needs: Not on file  Physical Activity: Not on file  Stress: Not on file  Social Connections: Not on file     Family History: The patient's family history includes Heart disease in an other family member; Hypertension (age of onset: 62) in his father; Liver cancer in his maternal grandmother; Stroke in his father; Transient ischemic attack in his father.  ROS:   Please see the history of present illness.     All other systems reviewed and are negative.  EKGs/Labs/Other Studies Reviewed:    The following studies were reviewed today:   EKG:  EKG is  ordered today.  The ekg ordered today demonstrates   11/2/222-NSR, [place] PVCs  Recent Labs: 06/12/2021: ALT 41; BUN 23; Creatinine, Ser 1.30; Hemoglobin 15.5; Platelets 278.0; Potassium 4.3; Sodium 138; TSH 2.77  Recent Lipid Panel    Component Value Date/Time   CHOL 175 11/07/2021 0802   TRIG 118 11/07/2021 0802   HDL 46 11/07/2021 0802   CHOLHDL 3.8 11/07/2021 0802   CHOLHDL 3 06/12/2021 0851   VLDL 31.2 06/12/2021 0851   LDLCALC 108 (H) 11/07/2021 0802   LDLDIRECT 90.2 10/27/2009 0722     Risk Assessment/Calculations:    The 10-year ASCVD risk score (Arnett DK, et al., 2019) is: 13.4%   Values used to calculate the score:     Age: 76 years     Sex: Male     Is Non-Hispanic African American: No     Diabetic: No     Tobacco smoker: No     Systolic Blood Pressure: 597 mmHg     Is BP treated: Yes      HDL Cholesterol: 46 mg/dL     Total Cholesterol: 175 mg/dL        Physical Exam:    VS:  BP 130/78    Pulse 65    Ht 6\' 2"  (1.88 m)    Wt 189 lb 3.2 oz (85.8 kg)    SpO2 98%  BMI 24.29 kg/m     Wt Readings from Last 3 Encounters:  11/08/21 189 lb 3.2 oz (85.8 kg)  08/10/21 189 lb (85.7 kg)  08/01/21 189 lb 6.4 oz (85.9 kg)     GEN:  Well nourished, well developed in no acute distress HEENT: moist mucous membranes LYMPHATICS: No lymphadenopathy CARDIAC: RRR, no murmurs, rubs, gallops RESPIRATORY:  Clear to auscultation without rales, wheezing or rhonchi  ABDOMEN: Soft, non-tender, non-distended MUSCULOSKELETAL:  No edema; No deformity  SKIN: Warm and dry NEUROLOGIC:  Alert and oriented x 3 PSYCHIATRIC:  Normal affect   ASSESSMENT:    #Palpitations: he had noted PACs and PVCs in the past. He is anxious about his rhythm, we discussed that he can repeat a cardiac event monitor.   #High CAC:His CAC is very high and he is at intermediate risk for CVD. He also has family hx of premature CAD. Further, his PVCs are more frequent. And PVC morphology in lead III is different than prior. Will opt for ischemic work up at this time as well as an echo to assess LV function with ongoing PVCs. - continue pravastatin 80 mg - adding zetia 10 mg daily  - LDL goal < 70 mg/dL (LDL 108 mg/dL  11/07/2021) - repeat lipids  #Cardio-Oncology: Now off everolimus. No known high risk for cardiotoxicity   PLAN:    In order of problems listed above:  Add zetia Cardiac monitor Follow up in 6 months      Shared Decision Making/Informed Consent The risks [chest pain, shortness of breath, cardiac arrhythmias, dizziness, blood pressure fluctuations, myocardial infarction, stroke/transient ischemic attack, nausea, vomiting, allergic reaction, radiation exposure, metallic taste sensation and life-threatening complications (estimated to be 1 in 10,000)], benefits (risk stratification, diagnosing  coronary artery disease, treatment guidance) and alternatives of a nuclear stress test were discussed in detail with Mr. Lammert and he agrees to proceed.    Medication Adjustments/Labs and Tests Ordered: Current medicines are reviewed at length with the patient today.  Concerns regarding medicines are outlined above.    Signed, Janina Mayo, MD  11/08/2021 8:02 AM    [facility]

## 2021-11-09 MED ORDER — EZETIMIBE 10 MG PO TABS: 10.0000 mg | ORAL_TABLET | Freq: Every day | ORAL | 3 refills | Status: DC

## 2021-11-13 DIAGNOSIS — R002 Palpitations: Secondary | ICD-10-CM | POA: Diagnosis not present

## 2021-11-28 ENCOUNTER — Other Ambulatory Visit: Payer: Self-pay

## 2021-11-28 MED ORDER — METOPROLOL SUCCINATE ER 50 MG PO TB24: 150.0000 mg | ORAL_TABLET | Freq: Every day | ORAL | 0 refills | Status: DC

## 2022-03-27 ENCOUNTER — Encounter: Payer: Self-pay | Admitting: *Deleted

## 2022-03-27 DIAGNOSIS — Z006 Encounter for examination for normal comparison and control in clinical research program: Secondary | ICD-10-CM

## 2022-03-27 NOTE — Research (Signed)
I called patient about the Victorion1-Prevention Study. I discussed the study with the patient. I sent e-mail to patient with consent enclosed for his viewing. Patient will let us know if he wants to participate.

## 2022-04-02 ENCOUNTER — Other Ambulatory Visit: Payer: Self-pay | Admitting: Internal Medicine

## 2022-04-06 ENCOUNTER — Other Ambulatory Visit (INDEPENDENT_AMBULATORY_CARE_PROVIDER_SITE_OTHER): Payer: 59

## 2022-04-06 DIAGNOSIS — R7989 Other specified abnormal findings of blood chemistry: Secondary | ICD-10-CM

## 2022-04-06 DIAGNOSIS — R739 Hyperglycemia, unspecified: Secondary | ICD-10-CM | POA: Diagnosis not present

## 2022-04-06 DIAGNOSIS — I10 Essential (primary) hypertension: Secondary | ICD-10-CM | POA: Diagnosis not present

## 2022-04-06 DIAGNOSIS — R252 Cramp and spasm: Secondary | ICD-10-CM | POA: Diagnosis not present

## 2022-04-06 DIAGNOSIS — R350 Frequency of micturition: Secondary | ICD-10-CM

## 2022-04-06 DIAGNOSIS — E538 Deficiency of other specified B group vitamins: Secondary | ICD-10-CM | POA: Diagnosis not present

## 2022-04-06 DIAGNOSIS — F5232 Male orgasmic disorder: Secondary | ICD-10-CM | POA: Diagnosis not present

## 2022-04-06 LAB — VITAMIN B12: Vitamin B-12: 1047 pg/mL — ABNORMAL HIGH (ref 211–911)

## 2022-04-06 LAB — URINALYSIS, ROUTINE W REFLEX MICROSCOPIC
Bilirubin Urine: NEGATIVE
Hgb urine dipstick: NEGATIVE
Ketones, ur: NEGATIVE
Leukocytes,Ua: NEGATIVE
Nitrite: NEGATIVE
Specific Gravity, Urine: 1.025 (ref 1.000–1.030)
Total Protein, Urine: NEGATIVE
Urine Glucose: NEGATIVE
Urobilinogen, UA: 0.2 (ref 0.0–1.0)
pH: 6 (ref 5.0–8.0)

## 2022-04-06 LAB — CBC WITH DIFFERENTIAL/PLATELET
Basophils Absolute: 0.1 10*3/uL (ref 0.0–0.1)
Basophils Relative: 1 % (ref 0.0–3.0)
Eosinophils Absolute: 0.1 10*3/uL (ref 0.0–0.7)
Eosinophils Relative: 2.4 % (ref 0.0–5.0)
HCT: 43.8 % (ref 39.0–52.0)
Hemoglobin: 14.7 g/dL (ref 13.0–17.0)
Lymphocytes Relative: 24.3 % (ref 12.0–46.0)
Lymphs Abs: 1.4 10*3/uL (ref 0.7–4.0)
MCHC: 33.6 g/dL (ref 30.0–36.0)
MCV: 90.7 fl (ref 78.0–100.0)
Monocytes Absolute: 0.6 10*3/uL (ref 0.1–1.0)
Monocytes Relative: 11 % (ref 3.0–12.0)
Neutro Abs: 3.5 10*3/uL (ref 1.4–7.7)
Neutrophils Relative %: 61.3 % (ref 43.0–77.0)
Platelets: 256 10*3/uL (ref 150.0–400.0)
RBC: 4.82 Mil/uL (ref 4.22–5.81)
RDW: 12.7 % (ref 11.5–15.5)
WBC: 5.7 10*3/uL (ref 4.0–10.5)

## 2022-04-06 LAB — HEPATIC FUNCTION PANEL
ALT: 21 U/L (ref 0–53)
AST: 24 U/L (ref 0–37)
Albumin: 4.3 g/dL (ref 3.5–5.2)
Alkaline Phosphatase: 56 U/L (ref 39–117)
Bilirubin, Direct: 0.1 mg/dL (ref 0.0–0.3)
Total Bilirubin: 0.5 mg/dL (ref 0.2–1.2)
Total Protein: 7 g/dL (ref 6.0–8.3)

## 2022-04-06 LAB — TSH: TSH: 3.86 u[IU]/mL (ref 0.35–5.50)

## 2022-04-06 LAB — T3, FREE: T3, Free: 3.4 pg/mL (ref 2.3–4.2)

## 2022-04-06 LAB — MAGNESIUM: Magnesium: 1.9 mg/dL (ref 1.5–2.5)

## 2022-04-06 LAB — SEDIMENTATION RATE: Sed Rate: 14 mm/hr (ref 0–20)

## 2022-04-06 LAB — T4, FREE: Free T4: 0.75 ng/dL (ref 0.60–1.60)

## 2022-04-06 LAB — HEMOGLOBIN A1C: Hgb A1c MFr Bld: 6 % (ref 4.6–6.5)

## 2022-04-08 LAB — URINE CULTURE
MICRO NUMBER:: 13708221
Result:: NO GROWTH
SPECIMEN QUALITY:: ADEQUATE

## 2022-04-09 ENCOUNTER — Encounter: Payer: 59 | Admitting: Internal Medicine

## 2022-04-14 LAB — TESTOSTERONE, FREE, TOTAL, SHBG
Sex Hormone Binding: 54.9 nmol/L (ref 19.3–76.4)
Testosterone, Free: 5.1 pg/mL — ABNORMAL LOW (ref 6.6–18.1)
Testosterone: 610 ng/dL (ref 264–916)

## 2022-04-17 ENCOUNTER — Encounter: Payer: Self-pay | Admitting: Internal Medicine

## 2022-04-17 ENCOUNTER — Ambulatory Visit (INDEPENDENT_AMBULATORY_CARE_PROVIDER_SITE_OTHER): Payer: 59 | Admitting: Internal Medicine

## 2022-04-17 VITALS — BP 122/62 | HR 71 | Temp 98.2°F | Ht 74.0 in | Wt 183.0 lb

## 2022-04-17 DIAGNOSIS — Z0001 Encounter for general adult medical examination with abnormal findings: Secondary | ICD-10-CM

## 2022-04-17 DIAGNOSIS — R739 Hyperglycemia, unspecified: Secondary | ICD-10-CM

## 2022-04-17 DIAGNOSIS — E7800 Pure hypercholesterolemia, unspecified: Secondary | ICD-10-CM | POA: Diagnosis not present

## 2022-04-17 DIAGNOSIS — I10 Essential (primary) hypertension: Secondary | ICD-10-CM

## 2022-04-17 DIAGNOSIS — E559 Vitamin D deficiency, unspecified: Secondary | ICD-10-CM | POA: Diagnosis not present

## 2022-04-17 DIAGNOSIS — Z125 Encounter for screening for malignant neoplasm of prostate: Secondary | ICD-10-CM

## 2022-04-17 DIAGNOSIS — H9193 Unspecified hearing loss, bilateral: Secondary | ICD-10-CM

## 2022-04-17 DIAGNOSIS — N1831 Chronic kidney disease, stage 3a: Secondary | ICD-10-CM

## 2022-04-17 DIAGNOSIS — I700 Atherosclerosis of aorta: Secondary | ICD-10-CM

## 2022-04-17 DIAGNOSIS — E538 Deficiency of other specified B group vitamins: Secondary | ICD-10-CM

## 2022-04-17 DIAGNOSIS — Z1211 Encounter for screening for malignant neoplasm of colon: Secondary | ICD-10-CM

## 2022-04-17 NOTE — Progress Notes (Unsigned)
PRE-PROCEDURE EXAM:  TM cannot be visualized due to total occlusion/impaction of the ear canal.  PROCEDURE INDICATION: remove wax to visualize ear drum & relieve discomfort  CONSENT:  Verbal     PROCEDURE NOTE:     BILATERAL EAR:  I used a plastic wax curette under direct vision with an otoscope to free the wax bolus from the ear wall and then successfully removed a small bit of wax. The ear was then irrigated with warm water to remove the remaining wax.  POST- PROCEDURE EXAM: TMs successfully visualized and found to have no erythema     The patient tolerated the procedure well.

## 2022-04-17 NOTE — Patient Instructions (Addendum)
Your ears were irrigated of wax today  Ok to decrease the frequency of the B12 shots to monthly  Please continue all other medications as before, and refills have been done if requested.  Please have the pharmacy call with any other refills you may need.  Please continue your efforts at being more active, low cholesterol diet, and weight control.  You are otherwise up to date with prevention measures today.  Please keep your appointments with your specialists as you may have planned  Please go to the LAB at the blood drawing area for the tests to be done - at the [facility] lab just before your Cardiology appt soon  You will be contacted by phone if any changes need to be made immediately.  Otherwise, you will receive a letter about your results with an explanation, but please check with MyChart first.  Please remember to sign up for MyChart if you have not done so, as this will be important to you in the future with finding out test results, communicating by private email, and scheduling acute appointments online when needed.  Please make an Appointment to return for your 1 year visit, or sooner if needed, with Lab testing by Appointment as well, to be done about 3-5 days before at the [facility] (so this is for TWO appointments - please see the scheduling desk as you leave)

## 2022-04-17 NOTE — Progress Notes (Unsigned)
Patient ID: Brandon Sanders, male   DOB: April 08, 1956, 66 y.o.   MRN: [id number]        Chief Complaint:: wellness exam and low B12, hld, bilat hearing reduced, htn, hyperglycemia       HPI:  Brandon Sanders is a 66 y.o. male here for wellness exam; due for tdap and shingrix and plans to have done at the pharmacy, declines colonoscopy, o/w up to date                        Also has been taking b12 daily.  Good compliance with pravachol 80 mg qd and tolerating well.  Has reduced hearing recently for 1 wk, without HA, fever, ST, cough.  Pt denies chest pain, increased sob or doe, wheezing, orthopnea, PND, increased LE swelling, palpitations, dizziness or syncope.   Pt denies polydipsia, polyuria, or new focal neuro s/s.    Pt denies fever, wt loss, night sweats, loss of appetite, or other constitutional symptoms     Wt Readings from Last 3 Encounters:  04/17/22 183 lb (83 kg)  11/08/21 189 lb 3.2 oz (85.8 kg)  08/10/21 189 lb (85.7 kg)   BP Readings from Last 3 Encounters:  04/17/22 122/62  11/08/21 130/78  08/01/21 132/70   Immunization History  Administered Date(s) Administered   H1N1 07/12/2008   Influenza Whole 07/12/2008   Influenza, High Dose Seasonal PF 06/09/2015   Influenza, Seasonal, Injecte, Preservative Fre 06/03/2014   Influenza,inj,Quad PF,6+ Mos 06/10/2014, 06/19/2018, 05/21/2019, 05/26/2020   Influenza,inj,quad, With Preservative 06/10/2017   Influenza-Unspecified 07/12/2011, 06/16/2012, 06/10/2014, 07/12/2015, 06/19/2018   PFIZER(Purple Top)SARS-COV-2 Vaccination 10/21/2019, 12/09/2019, 07/26/2020   Td 10/11/2000   Tdap 12/26/2010   Unspecified SARS-COV-2 Vaccination 11/18/2019   There are no preventive care reminders to display for this patient.     Past Medical History:  Diagnosis Date   Carpal tunnel syndrome, bilateral    Chronic renal insufficiency    COLITIS 12/17/2007   DJD of shoulder    Erectile dysfunction    managed with injections; no response with  Viagra/Cialis   HTN (hypertension)    Hyperlipidemia    PLANTAR FASCIITIS 09/05/2010   Renal cell cancer ([place])    currently in clinical trial using Afinitor   Solitary kidney, acquired    SYNCOPE 09/05/2010   Past Surgical History:  Procedure Laterality Date   NEPHRECTOMY     Right   s.p right CTS release     s/p right ulnar nerve release     VASECTOMY      reports that he has never smoked. He has never used smokeless tobacco. He reports that he does not drink alcohol and does not use drugs. family history includes Heart disease in an other family member; Hypertension (age of onset: 67) in his father; Liver cancer in his maternal grandmother; Stroke in his father; Transient ischemic attack in his father. Allergies  Allergen Reactions   Penicillins     The patient has taken amoxicillin in the past.  No problem with it   Current Outpatient Medications on File Prior to Visit  Medication Sig Dispense Refill   ALLERGY RELIEF 10 MG tablet TAKE 1 TABLET BY MOUTH DAILY 90 tablet 0   amLODipine (NORVASC) 5 MG tablet TAKE 1 TABLET BY MOUTH  DAILY 90 tablet 3   aspirin 81 MG EC tablet Take 81 mg by mouth daily.     azelastine (OPTIVAR) 0.05 % ophthalmic solution Place 1 drop into  both eyes 2 (two) times daily. 6 mL 5   cyanocobalamin (,VITAMIN B-12,) 1000 MCG/ML injection Inject into the muscle. weekly     ezetimibe (ZETIA) 10 MG tablet Take 1 tablet (10 mg total) by mouth daily. 90 tablet 3   fenofibrate 160 MG tablet TAKE 1 TABLET BY MOUTH  DAILY 90 tablet 3   fluticasone (FLONASE) 50 MCG/ACT nasal spray USE 2 SPRAYS IN BOTH  NOSTRILS DAILY 48 g 2   ketoconazole (NIZORAL) 2 % cream APPLY TO AFFECTED AREA EVERY DAY 30 g 2   metoprolol succinate (TOPROL-XL) 50 MG 24 hr tablet Take 3 tablets (150 mg total) by mouth daily. 90 tablet 0   pravastatin (PRAVACHOL) 80 MG tablet Take 1 tablet (80 mg total) by mouth daily. 90 tablet 3   No current facility-administered medications on file prior to  visit.        ROS:  All others reviewed and negative.  Objective        PE:  BP 122/62 (BP Location: Right Arm, Patient Position: Sitting, Cuff Size: Large)   Pulse 71   Temp 98.2 F (36.8 C) (Oral)   Ht '6\' 2"'$  (1.88 m)   Wt 183 lb (83 kg)   SpO2 96%   BMI 23.50 kg/m                 Constitutional: Pt appears in NAD               HENT: Head: NCAT.                Right Ear: External ear normal.                 Left Ear: External ear normal.                Eyes: . Pupils are equal, round, and reactive to light. Conjunctivae and EOM are normal               Nose: without d/c or deformity               Neck: Neck supple. Gross normal ROM               Cardiovascular: Normal rate and regular rhythm.                 Pulmonary/Chest: Effort normal and breath sounds without rales or wheezing.                Abd:  Soft, NT, ND, + BS, no organomegaly               Neurological: Pt is alert. At baseline orientation, motor grossly intact               Skin: Skin is warm. No rashes, no other new lesions, LE edema - none               Psychiatric: Pt behavior is normal without agitation   Micro: none  Cardiac tracings I have personally interpreted today:  none  Pertinent Radiological findings (summarize): none   Lab Results  Component Value Date   WBC 5.7 04/06/2022   HGB 14.7 04/06/2022   HCT 43.8 04/06/2022   PLT 256.0 04/06/2022   GLUCOSE 106 (H) 06/12/2021   CHOL 175 11/07/2021   TRIG 118 11/07/2021   HDL 46 11/07/2021   LDLDIRECT 90.2 10/27/2009   LDLCALC 108 (H) 11/07/2021   ALT 21 04/06/2022   AST 24 04/06/2022  NA 138 06/12/2021   K 4.3 06/12/2021   CL 101 06/12/2021   CREATININE 1.30 06/12/2021   BUN 23 06/12/2021   CO2 29 06/12/2021   TSH 3.86 04/06/2022   PSA 1.15 06/12/2021   INR 0.92 02/16/2011   HGBA1C 6.0 04/06/2022   MICROALBUR 1.1 04/05/2016   Assessment/Plan:  Brandon Sanders is a 66 y.o. White or Caucasian [1] male with  has a past medical history of  Carpal tunnel syndrome, bilateral, Chronic renal insufficiency, COLITIS (12/17/2007), DJD of shoulder, Erectile dysfunction, HTN (hypertension), Hyperlipidemia, PLANTAR FASCIITIS (09/05/2010), Renal cell cancer ([place]), Solitary kidney, acquired, and SYNCOPE (09/05/2010).  Encounter for well adult exam with abnormal findings Age and sex appropriate education and counseling updated with regular exercise and diet Referrals for preventative services - for colonoscopy Immunizations addressed - to have shingrix and tdap done at local pharmacy Smoking counseling  - none needed Evidence for depression or other mood disorder - none significant Most recent labs reviewed. I have personally reviewed and have noted: 1) the patient's medical and social history 2) The patient's current medications and supplements 3) The patient's height, weight, and BMI have been recorded in the chart   HYPERCHOLESTEROLEMIA Lab Results  Component Value Date   LDLCALC 108 (H) 11/07/2021   Uncontrolled, goal ldl < 100 , pt to continue current pravachol 80 mg, add zetia 10 qd    HTN (hypertension) BP Readings from Last 3 Encounters:  04/17/22 122/62  11/08/21 130/78  08/01/21 132/70   Stable, pt to continue medical treatment norvasc 5 qd, toprol xl 50 qd   CKD (chronic kidney disease) stage 3, GFR 30-59 ml/min (HCC) Lab Results  Component Value Date   CREATININE 1.30 06/12/2021   Stable overall, cont to avoid nephrotoxins   Hyperglycemia Lab Results  Component Value Date   HGBA1C 6.0 04/06/2022   Stable, pt to continue current medical treatment  - diet, wt control, excercise   Bilateral hearing loss Improved with wax irrigation  Ceruminosis is noted.  Wax is removed by syringing and manual debridement. Instructions for home care to prevent wax buildup are given.   Aortic atherosclerosis (HCC) Pt to continue pravachol 80 qd, zetia 10 qd, low chol diet  Low vitamin B12 level Lab Results   Component Value Date   VITAMINB12 1,047 (H) 04/06/2022   Overcontrolled, to decrease oral replacement - b12 1000 mcg to qod  Followup: Return in about 1 year (around 04/18/2023).  Cathlean Cower, MD 04/18/2022 7:48 PM [facility]

## 2022-04-18 ENCOUNTER — Encounter: Payer: Self-pay | Admitting: Internal Medicine

## 2022-04-18 NOTE — Assessment & Plan Note (Signed)
Lab Results  Component Value Date   CREATININE 1.30 06/12/2021   Stable overall, cont to avoid nephrotoxins

## 2022-04-18 NOTE — Assessment & Plan Note (Signed)
Pt to continue pravachol 80 qd, zetia 10 qd, low chol diet

## 2022-04-18 NOTE — Assessment & Plan Note (Signed)
Lab Results  Component Value Date   VITAMINB12 1,047 (H) 04/06/2022   Overcontrolled, to decrease oral replacement - b12 1000 mcg to qod

## 2022-04-18 NOTE — Assessment & Plan Note (Signed)
Improved with wax irrigation  Ceruminosis is noted.  Wax is removed by syringing and manual debridement. Instructions for home care to prevent wax buildup are given.

## 2022-04-18 NOTE — Addendum Note (Signed)
Addended by: Biagio Borg on: 04/18/2022 07:50 PM   Modules accepted: Orders

## 2022-04-18 NOTE — Assessment & Plan Note (Signed)
Lab Results  Component Value Date   LDLCALC 108 (H) 11/07/2021   Uncontrolled, goal ldl < 100 , pt to continue current pravachol 80 mg, add zetia 10 qd

## 2022-04-18 NOTE — Assessment & Plan Note (Signed)
BP Readings from Last 3 Encounters:  04/17/22 122/62  11/08/21 130/78  08/01/21 132/70   Stable, pt to continue medical treatment norvasc 5 qd, toprol xl 50 qd

## 2022-04-18 NOTE — Assessment & Plan Note (Signed)
Age and sex appropriate education and counseling updated with regular exercise and diet Referrals for preventative services - for colonoscopy Immunizations addressed - to have shingrix and tdap done at local pharmacy Smoking counseling  - none needed Evidence for depression or other mood disorder - none significant Most recent labs reviewed. I have personally reviewed and have noted: 1) the patient's medical and social history 2) The patient's current medications and supplements 3) The patient's height, weight, and BMI have been recorded in the chart

## 2022-04-18 NOTE — Assessment & Plan Note (Signed)
Lab Results  Component Value Date   HGBA1C 6.0 04/06/2022   Stable, pt to continue current medical treatment  - diet, wt control, excercise

## 2022-04-26 ENCOUNTER — Ambulatory Visit: Payer: 59 | Admitting: Family Medicine

## 2022-04-26 ENCOUNTER — Encounter: Payer: Self-pay | Admitting: Family Medicine

## 2022-04-26 VITALS — BP 136/76 | HR 61 | Temp 97.7°F | Ht 74.0 in | Wt 183.0 lb

## 2022-04-26 DIAGNOSIS — H60502 Unspecified acute noninfective otitis externa, left ear: Secondary | ICD-10-CM

## 2022-04-26 DIAGNOSIS — H66001 Acute suppurative otitis media without spontaneous rupture of ear drum, right ear: Secondary | ICD-10-CM

## 2022-04-26 MED ORDER — AMOXICILLIN-POT CLAVULANATE 875-125 MG PO TABS: 1.0000 | ORAL_TABLET | Freq: Two times a day (BID) | ORAL | 0 refills | Status: DC

## 2022-04-26 MED ORDER — CIPROFLOXACIN-DEXAMETHASONE 0.3-0.1 % OT SUSP: 4.0000 [drp] | Freq: Two times a day (BID) | OTIC | 0 refills | Status: DC

## 2022-04-26 NOTE — Progress Notes (Signed)
Subjective:  Brandon Sanders is a 66 y.o. male who presents for a 1-2 wk hx of left ear feeling full and painful as well as his right ear feeling abnormal but not as bad as his left ear.  Denies fever, chills, headache, dizziness, nasal congestion, sore throat, cough, N/V/D  He has not taken anything for pain  No other aggravating or relieving factors.  No other c/o.  ROS as in subjective.  Past Medical History:  Diagnosis Date   Carpal tunnel syndrome, bilateral    Chronic renal insufficiency    COLITIS 12/17/2007   DJD of shoulder    Erectile dysfunction    managed with injections; no response with Viagra/Cialis   HTN (hypertension)    Hyperlipidemia    PLANTAR FASCIITIS 09/05/2010   Renal cell cancer ([place])    currently in clinical trial using Afinitor   Solitary kidney, acquired    SYNCOPE 09/05/2010      Objective: Vitals:   04/26/22 1440  BP: 136/76  Pulse: 61  Temp: 97.7 F (36.5 C)  SpO2: 98%    General appearance: Alert, WD/WN, no distress, well appearing                             Skin: warm, no rash                           Head: no sinus tenderness                            Eyes: conjunctiva normal, corneas clear, PERRLA                            Ears: right TM with exudate at lower rim, canal clear, left TM with erythema, retraction and  external canal with edema and erythema.              Mouth/throat: MMM                           Neck: supple, no adenopathy, nontender                           Assessment: Acute otitis externa of left ear, unspecified type - Plan: ciprofloxacin-dexamethasone (CIPRODEX) OTIC suspension, amoxicillin-clavulanate (AUGMENTIN) 875-125 MG tablet  Non-recurrent acute suppurative otitis media of right ear without spontaneous rupture of tympanic membrane - Plan: amoxicillin-clavulanate (AUGMENTIN) 875-125 MG tablet   Plan: Augmentin and Ciprodex otic suspension prescribed.  Tylenol or Ibuprofen OTC for pain prn.   Call/return if worsening or if not back to baseline when he completes the antibiotic.

## 2022-04-26 NOTE — Patient Instructions (Signed)
Take the oral antibiotic as prescribed.  I also prescribed topical antibiotic/steroid for your left ear.  Follow-up if you are getting worse or not back to baseline when you complete the medications.

## 2022-05-25 ENCOUNTER — Ambulatory Visit: Payer: 59 | Attending: Internal Medicine | Admitting: Internal Medicine

## 2022-05-25 VITALS — BP 122/72 | HR 67 | Ht 74.0 in | Wt 182.0 lb

## 2022-05-25 DIAGNOSIS — R931 Abnormal findings on diagnostic imaging of heart and coronary circulation: Secondary | ICD-10-CM

## 2022-05-25 DIAGNOSIS — R002 Palpitations: Secondary | ICD-10-CM | POA: Diagnosis not present

## 2022-05-25 MED ORDER — METOPROLOL SUCCINATE ER 50 MG PO TB24: 150.0000 mg | ORAL_TABLET | Freq: Every day | ORAL | 0 refills | Status: DC

## 2022-05-25 MED ORDER — METOPROLOL SUCCINATE ER 50 MG PO TB24: 150.0000 mg | ORAL_TABLET | Freq: Every day | ORAL | 3 refills | Status: DC

## 2022-05-25 NOTE — Progress Notes (Signed)
Cardiology Office Note:    Date:  05/25/2022   ID:  Brandon Sanders, DOB Apr 06, 1956, MRN [id number]  PCP:  Biagio Borg, MD   [facility] Providers Cardiologist:  Janina Mayo, MD     Referring MD: Biagio Borg, MD   No chief complaint on file.  High CAC  History of Present Illness:    Brandon Sanders is a 66 y.o. male with a hx of HTN, CKD, RCC, HLD CAC 2052, predominant RCA lesion  His father has had heart disease at 53 years old and passed of heart failure. He had bypass surgery. He had a stroke.  He has had high cholesterol. He started taking metoprolol for prevention purposes. He has two PCPs. One with [facility] and [facility]. He is exhausted. He was offered the CAC scoring. He has not felt good in hears. He denies chest pain , no exertional dyspnea. He averages 5,000 steps a day. Her blood pressure is fairly well controlled 132/70 mmHg. He denies LH, dizziness, or syncope. No orthopnea, PND, no LE edema.   He has history of RCC on afinitor. He stopped it as to not wanting to be immunosuppressed. He took it for 8 years.  He was seen by Dr. Johnsie Cancel in 01/2011 for palpitations. He had a holter that showed PAC, and PVCs that were benign. He was started on metoprolol and noted dosing up to 100 mg XL. He had a myovue and echo that January that was normal.   Family Hx : mother had diabetes. Father had heart disease per above.  LDL 101 mg/dL TC 185 mg/dL  CVD Risk/Equivalent: HLD- yes HTN- yes PAD- No DMII- No Smoker-No Stroke-No Premature Family History- Yes  Interim hx: He notes frequent palpitations. He is worried something is wrong. It makes him feel anxious. He feels fatigued. He notes about 6 hours of sleep. He denies syncope. He denies LH  Interim Hx 05/25/2022 His zio showed mild PVC burden, I increased his metoprolol to 150 mg XL. His PVCs have improved. He is taking 125 mg. No DOE or chest pressure    07/24/2021 FINDINGS: Non-cardiac: See separate report from  [facility].   Ascending Aorta: Normal caliber.  Aortic atherosclerosis.   Aortic Valve: Aortic valve calcium score 419.   Pericardium: Normal.   Coronary arteries: Normal origins.   Coronary Calcium Score:   Left main: 36   Left anterior descending artery: 616   Left circumflex artery: 303   Right coronary artery: 1097   Total: 2052  08/10/2021 Exercise Spect- METS 9, 87% MPHR. Low risk nuclear study  08/22/2021 TTE- normal EF, normal strain, no valve disease, no pulmonary htn   Past Medical History:  Diagnosis Date   Carpal tunnel syndrome, bilateral    Chronic renal insufficiency    COLITIS 12/17/2007   DJD of shoulder    Erectile dysfunction    managed with injections; no response with Viagra/Cialis   HTN (hypertension)    Hyperlipidemia    PLANTAR FASCIITIS 09/05/2010   Renal cell cancer ([place])    currently in clinical trial using Afinitor   Solitary kidney, acquired    SYNCOPE 09/05/2010    Past Surgical History:  Procedure Laterality Date   NEPHRECTOMY     Right   s.p right CTS release     s/p right ulnar nerve release     VASECTOMY      Current Medications: No outpatient medications have been marked as taking for the 05/25/22 encounter (  Appointment) with Janina Mayo, MD.     Allergies:   Penicillins   Social History   Socioeconomic History   Marital status: Married    Spouse name: Not on file   Number of children: 3   Years of education: Not on file   Highest education level: Not on file  Occupational History   Occupation: Scientist, clinical (histocompatibility and immunogenetics): [employer]  Tobacco Use   Smoking status: Never   Smokeless tobacco: Never   Tobacco comments:    quit 25 yrs ago  Substance and Sexual Activity   Alcohol use: No   Drug use: No   Sexual activity: Yes  Other Topics Concern   Not on file  Social History Narrative   Lives with wife   Social Determinants of Health   Financial Resource Strain: Not on file  Food  Insecurity: Not on file  Transportation Needs: Not on file  Physical Activity: Not on file  Stress: Not on file  Social Connections: Not on file     Family History: The patient's family history includes Heart disease in an other family member; Hypertension (age of onset: 49) in his father; Liver cancer in his maternal grandmother; Stroke in his father; Transient ischemic attack in his father.  ROS:   Please see the history of present illness.     All other systems reviewed and are negative.  EKGs/Labs/Other Studies Reviewed:    The following studies were reviewed today:   EKG:  EKG is  ordered today.  The ekg ordered today demonstrates   11/2/222-NSR, [place] PVCs  02/22/2022- NSR   Recent Labs: 06/12/2021: BUN 23; Creatinine, Ser 1.30; Potassium 4.3; Sodium 138 04/06/2022: ALT 21; Hemoglobin 14.7; Magnesium 1.9; Platelets 256.0; TSH 3.86  Recent Lipid Panel    Component Value Date/Time   CHOL 175 11/07/2021 0802   TRIG 118 11/07/2021 0802   HDL 46 11/07/2021 0802   CHOLHDL 3.8 11/07/2021 0802   CHOLHDL 3 06/12/2021 0851   VLDL 31.2 06/12/2021 0851   LDLCALC 108 (H) 11/07/2021 0802   LDLDIRECT 90.2 10/27/2009 0722     Risk Assessment/Calculations:    The 10-year ASCVD risk score (Arnett DK, et al., 2019) is: 14.5%   Values used to calculate the score:     Age: 79 years     Sex: Male     Is Non-Hispanic African American: No     Diabetic: No     Tobacco smoker: No     Systolic Blood Pressure: 614 mmHg     Is BP treated: Yes     HDL Cholesterol: 46 mg/dL     Total Cholesterol: 175 mg/dL        Physical Exam:    VS:  There were no vitals taken for this visit.    Wt Readings from Last 3 Encounters:  04/26/22 183 lb (83 kg)  04/17/22 183 lb (83 kg)  11/08/21 189 lb 3.2 oz (85.8 kg)     GEN:  Well nourished, well developed in no acute distress HEENT: moist mucous membranes LYMPHATICS: No lymphadenopathy CARDIAC: RRR, no murmurs, rubs,  gallops RESPIRATORY:  Clear to auscultation without rales, wheezing or rhonchi  ABDOMEN: Soft, non-tender, non-distended MUSCULOSKELETAL:  No edema; No deformity  SKIN: Warm and dry NEUROLOGIC:  Alert and oriented x 3 PSYCHIATRIC:  Normal affect   ASSESSMENT:    #Palpitations: he had noted PACs and PVCs in the past. He is anxious about his rhythm. He had mild PVCs.  Increased his metop. Continue metop XL 125 mg mg daily  #High CAC:His CAC is very high and he is at intermediate risk for CVD. He also has family hx of premature CAD. Further, his PVCs are more frequent. And PVC morphology in lead III is different than prior. Will opt for ischemic work up at this time as well as an echo to assess LV function with ongoing PVCs. - continue pravastatin 80 mg - adding zetia 10 mg daily  - LDL goal < 70 mg/dL (LDL 108 mg/dL  11/07/2021) - repeat lipids  #Cardio-Oncology: Now off everolimus. No known high risk for cardiotoxicity   PLAN:    In order of problems listed above:  Fasting lipids Follow up in 12 months     Signed, Janina Mayo, MD  05/25/2022 7:51 AM    [facility]

## 2022-05-25 NOTE — Patient Instructions (Signed)
Medication Instructions:  Your physician recommends that you continue on your current medications as directed. Please refer to the Current Medication list given to you today.  *If you need a refill on your cardiac medications before your next appointment, please call your pharmacy*   Lab Work: Your physician recommends that you return for lab work in: next week or 2 for FASTING lipid panel  If you have labs (blood work) drawn today and your tests are completely normal, you will receive your results only by: [place] (if you have MyChart) OR A paper copy in the mail If you have any lab test that is abnormal or we need to change your treatment, we will call you to review the results.   Follow-Up: At [facility], you and your health needs are our priority.  As part of our continuing mission to provide you with exceptional heart care, we have created designated Provider Care Teams.  These Care Teams include your primary Cardiologist (physician) and Advanced Practice Providers (APPs -  Physician Assistants and Nurse Practitioners) who all work together to provide you with the care you need, when you need it.  We recommend signing up for the patient portal called "MyChart".  Sign up information is provided on this After Visit Summary.  MyChart is used to connect with patients for Virtual Visits (Telemedicine).  Patients are able to view lab/test results, encounter notes, upcoming appointments, etc.  Non-urgent messages can be sent to your provider as well.   To learn more about what you can do with MyChart, go to [email or web address].    Your next appointment:   12 month(s)  The format for your next appointment:   In Person  Provider:   Janina Mayo, MD

## 2022-06-12 ENCOUNTER — Other Ambulatory Visit: Payer: 59

## 2022-06-13 LAB — LIPID PANEL
Chol/HDL Ratio: 3.1 ratio (ref 0.0–5.0)
Cholesterol, Total: 141 mg/dL (ref 100–199)
HDL: 46 mg/dL (ref >39)
LDL Chol Calc (NIH): 77 mg/dL (ref 0–99)
Triglycerides: 94 mg/dL (ref 0–149)
VLDL Cholesterol Cal: 18 mg/dL (ref 5–40)

## 2022-07-29 ENCOUNTER — Other Ambulatory Visit: Payer: Self-pay | Admitting: Internal Medicine

## 2022-08-17 ENCOUNTER — Other Ambulatory Visit: Payer: Self-pay | Admitting: Internal Medicine

## 2022-09-09 ENCOUNTER — Telehealth: Payer: 59 | Admitting: Physician Assistant

## 2022-09-09 DIAGNOSIS — R6889 Other general symptoms and signs: Secondary | ICD-10-CM

## 2022-09-09 MED ORDER — AZELASTINE HCL 0.1 % NA SOLN: 1.0000 | Freq: Two times a day (BID) | NASAL | 0 refills | Status: DC

## 2022-09-09 MED ORDER — BENZONATATE 100 MG PO CAPS: 100.0000 mg | ORAL_CAPSULE | Freq: Three times a day (TID) | ORAL | 0 refills | Status: DC | PRN

## 2022-09-09 NOTE — Progress Notes (Signed)
E visit for Flu like symptoms   We are sorry that you are not feeling well.  Here is how we plan to help! Based on what you have shared with me it looks like you may have flu-like symptoms that should be watched but do not seem to indicate anti-viral treatment.  Influenza or "the flu" is   an infection caused by a respiratory virus. The flu virus is highly contagious and persons who did not receive their yearly flu vaccination may "catch" the flu from close contact.  We have anti-viral medications to treat the viruses that cause this infection. They are not a "cure" and only shorten the course of the infection. These prescriptions are most effective when they are given within the first 2 days of "flu" symptoms. Antiviral medication are indicated if you have a high risk of complications from the flu. You should  also consider an antiviral medication if you are in close contact with someone who is at risk. These medications can help patients avoid complications from the flu  but have side effects that you should know. Possible side effects from Tamiflu or oseltamivir include nausea, vomiting, diarrhea, dizziness, headaches, eye redness, sleep problems or other respiratory symptoms. You should not take Tamiflu if you have an allergy to oseltamivir or any to the ingredients in Tamiflu.  Based upon your symptoms and potential risk factors I recommend that you follow the flu symptoms recommendation that I have listed below.  This is an infection that is most likely caused by a virus. There are no specific treatments other than to help you with the symptoms until the infection runs its course.  We are sorry you are not feeling well.  Here is how we plan to help!  For nasal congestion, you may use an oral decongestants such as Mucinex D or if you have glaucoma or high blood pressure use plain Mucinex.  Saline nasal spray or nasal drops can help and can safely be used as often as needed for congestion.  For  your congestion, I have prescribed Azelastine nasal spray two sprays in each nostril twice a day  If you do not have a history of heart disease, hypertension, diabetes or thyroid disease, prostate/bladder issues or glaucoma, you may also use Sudafed to treat nasal congestion.  It is highly recommended that you consult with a pharmacist or your primary care physician to ensure this medication is safe for you to take.     If you have a cough, you may use cough suppressants such as Delsym and Robitussin.  If you have glaucoma or high blood pressure, you can also use Coricidin HBP.   For cough I have prescribed for you A prescription cough medication called Tessalon Perles 100 mg. You may take 1-2 capsules every 8 hours as needed for cough  If you have a sore or scratchy throat, use a saltwater gargle-  to  teaspoon of salt dissolved in a 4-ounce to 8-ounce glass of warm water.  Gargle the solution for approximately 15-30 seconds and then spit.  It is important not to swallow the solution.  You can also use throat lozenges/cough drops and Chloraseptic spray to help with throat pain or discomfort.  Warm or cold liquids can also be helpful in relieving throat pain.  For headache, pain or general discomfort, you can use Ibuprofen or Tylenol as directed.   Some authorities believe that zinc sprays or the use of Echinacea may shorten the course of your symptoms.  ANYONE WHO HAS FLU SYMPTOMS SHOULD: Stay home. The flu is highly contagious and going out or to work exposes others! Be sure to drink plenty of fluids. Water is fine as well as fruit juices, sodas and electrolyte beverages. You may want to stay away from caffeine or alcohol. If you are nauseated, try taking small sips of liquids. How do you know if you are getting enough fluid? Your urine should be a pale yellow or almost colorless. Get rest. Taking a steamy shower or using a humidifier may help nasal congestion and ease sore throat pain. Using a  saline nasal spray works much the same way. Cough drops, hard candies and sore throat lozenges may ease your cough. Line up a caregiver. Have someone check on you regularly.   GET HELP RIGHT AWAY IF: You cannot keep down liquids or your medications. You become short of breath Your fell like you are going to pass out or loose consciousness. Your symptoms persist after you have completed your treatment plan MAKE SURE YOU  Understand these instructions. Will watch your condition. Will get help right away if you are not doing well or get worse.  Your e-visit answers were reviewed by a board certified advanced clinical practitioner to complete your personal care plan.  Depending on the condition, your plan could have included both over the counter or prescription medications.  If there is a problem please reply  once you have received a response from your provider.  Your safety is important to [place].  If you have drug allergies check your prescription carefully.    You can use MyChart to ask questions about today's visit, request a non-urgent call back, or ask for a work or school excuse for 24 hours related to this e-Visit. If it has been greater than 24 hours you will need to follow up with your provider, or enter a new e-Visit to address those concerns.  You will get an e-mail in the next two days asking about your experience.  I hope that your e-visit has been valuable and will speed your recovery. Thank you for using e-visits.  I have spent 5 minutes in review of e-visit questionnaire, review and updating patient chart, medical decision making and response to patient.   Mar Daring, PA-C

## 2022-09-14 ENCOUNTER — Other Ambulatory Visit: Payer: Self-pay | Admitting: Internal Medicine

## 2022-09-15 NOTE — Telephone Encounter (Signed)
Please refill as per office routine med refill policy (all routine meds to be refilled for 3 mo or monthly (per pt preference) up to one year from last visit, then month to month grace period for 3 mo, then further med refills will have to be denied) ? ?

## 2022-10-26 ENCOUNTER — Other Ambulatory Visit: Payer: Self-pay | Admitting: Internal Medicine

## 2022-12-23 ENCOUNTER — Other Ambulatory Visit: Payer: Self-pay | Admitting: Internal Medicine

## 2022-12-24 NOTE — Telephone Encounter (Signed)
Patient would like to get 7 days from [facility] on [address] to hold him over until he receives this rx from [place].

## 2023-01-14 ENCOUNTER — Telehealth: Payer: Self-pay | Admitting: Internal Medicine

## 2023-01-14 ENCOUNTER — Other Ambulatory Visit: Payer: Self-pay | Admitting: Internal Medicine

## 2023-01-14 ENCOUNTER — Other Ambulatory Visit: Payer: Self-pay

## 2023-01-14 NOTE — Telephone Encounter (Signed)
Prescription Request  01/14/2023  LOV: 04/17/2022  What is the name of the medication or equipment? fenofibrate 160 MG tablet   Have you contacted your pharmacy to request a refill? Yes   Which pharmacy would you like this sent to?  [facility] - [place], [place] - [address] [facility] [employer] [place] [address] Phone: [phone] Fax: [phone]    Patient notified that their request is being sent to the clinical staff for review and that they should receive a response within 2 business days.   Please advise at Mobile [phone] (mobile)   Patient requesting 90 day supply.

## 2023-01-14 NOTE — Telephone Encounter (Signed)
Refill sent.

## 2023-03-26 ENCOUNTER — Telehealth: Payer: Self-pay | Admitting: Internal Medicine

## 2023-03-26 NOTE — Telephone Encounter (Signed)
Labs in epic already expired would like cpx labs entered again.Marland KitchenRaechel Chute

## 2023-03-26 NOTE — Telephone Encounter (Signed)
Labs appear to expire in feb 2027, so ok to use the ones already ordered, thanks

## 2023-03-26 NOTE — Telephone Encounter (Signed)
Pt called to make his physical appt and wanted blood work orders put in. Please advise.

## 2023-03-27 NOTE — Telephone Encounter (Signed)
Notified pt labs in epic.Marland KitchenRaechel Chute

## 2023-04-01 ENCOUNTER — Other Ambulatory Visit: Payer: Self-pay | Admitting: Specialist

## 2023-04-01 ENCOUNTER — Encounter: Payer: Self-pay | Admitting: Specialist

## 2023-04-01 DIAGNOSIS — M5416 Radiculopathy, lumbar region: Secondary | ICD-10-CM

## 2023-04-02 LAB — LAB REPORT - SCANNED: EGFR: 59

## 2023-04-09 ENCOUNTER — Telehealth: Payer: Self-pay | Admitting: Internal Medicine

## 2023-04-09 ENCOUNTER — Encounter: Payer: Self-pay | Admitting: Adult Health

## 2023-04-09 ENCOUNTER — Ambulatory Visit: Payer: 59 | Admitting: Adult Health

## 2023-04-09 VITALS — BP 118/74 | HR 65 | Ht 74.0 in | Wt 176.8 lb

## 2023-04-09 DIAGNOSIS — I10 Essential (primary) hypertension: Secondary | ICD-10-CM | POA: Diagnosis not present

## 2023-04-09 MED ORDER — ATORVASTATIN CALCIUM 80 MG PO TABS: 80.0000 mg | ORAL_TABLET | Freq: Every day | ORAL | 3 refills | Status: DC

## 2023-04-09 NOTE — Progress Notes (Signed)
Cardiology Clinic Note   Patient Name: Brandon Sanders Date of Encounter: 04/09/2023  Primary Care Provider:  Corwin Levins, MD Primary Cardiologist:  Maisie Fus, MD  Patient Profile    67 year old male with history of coronary artery disease with an RCA frequent palpitations, lesion, hypertension, hyperlipidemia, palpitations on metoprolol coronary artery calcium score of 2052, chronic kidney disease, with a solitary kidney.    Past Medical History    Past Medical History:  Diagnosis Date   Carpal tunnel syndrome, bilateral    Chronic renal insufficiency    COLITIS 12/17/2007   DJD of shoulder    Erectile dysfunction    managed with injections; no response with Viagra/Cialis   HTN (hypertension)    Hyperlipidemia    PLANTAR FASCIITIS 09/05/2010   Renal cell cancer (HCC)    currently in clinical trial using Afinitor   Solitary kidney, acquired    SYNCOPE 09/05/2010   Past Surgical History:  Procedure Laterality Date   NEPHRECTOMY     Right   s.p right CTS release     s/p right ulnar nerve release     VASECTOMY      Allergies  No Active Allergies   History of Present Illness    Brandon Sanders comes today 1 week status post myocardial infarction while on vacation in [place].  The patient had been visiting there a white water rafting and hiking when he began to have worsening neck and right shoulder pain radiating into his chest.    He had been having neck pain radiating down his right arm for 3 weeks prior to this episode.  Had been seen by neurology for L2-L3 radiculopathy receiving injections in his back for the past 2 years.  However the pain changed and was radiating into his chest from the right side.  He treated himself with aspirin at first.  His wife is a Engineer, civil (consulting), and suggested he do so.  But around 3 AM in the morning of March 30, 2023 he presented to the ED at [facility],  [place], [place] with NSTEMI.  And route to ED he did take 2  more 325 mg aspirin.  The patient was admitted placed on heparin drip and plan for urgent cardiac catheterization.  Due to single kidney he was placed on renal protective strategies during angiogram.  Cardiac catheterization was performed on 04/01/2023, by Dr. Mikey College  [place].  Angiographic images demonstrated severe single-vessel disease involving the the mid circumflex.  Left main patent bifurcating into the LAD and nondominant circumflex.  Circumflex was nondominant vessel with diffuse calcified atherosclerosis within the proximal and mid segment.  The mid circumflex leading into the large caliber old distal OM had 95% stenosis which was believed to be the culprit vessel.  There was also 50% stenosis more proximally.  LAD is a transapical vessel with diffuse luminal irregularities.  There was a 40% stenosis within the midportion of the vessel at the takeoff of a moderate caliber branch.  Right coronary artery was large dominant vessel with diffuse calcified atherosclerosis with focal 40% to 50% stenosis in its mid segment.  There were several focal 67% stenosis distally just before the crux.  Beyond the crux there was a moderate caliber posterior lateral manage and PDA both of which have luminal irregularities.  Intervention: A 2.5 x 26 mm Resolute stent to the mid circumflex leading into the marginal branch was deployed.  (Please see scanned cardiac catheterization report for more details).  The patient was discharged on April 04, 2023.  He was placed on pravastatin 80 mg daily and taken off of rosuvastatin 20 mg daily.  He was also started on ticagrelor 90 mg twice daily continued on 81 mg aspirin, metoprolol 100 mg twice daily, he was also continued on Zetia 10 mg daily, amlodipine 5 mg daily, fenofibrate 160 mg daily, and given a prescription for sublingual nitroglycerin 0.4 mg as needed.  Since returning home the patient has felt tired, he denies any recurrent chest pain.  He did have  brief episodes of shortness of breath while for starting his Brilinta which has subsided within the last 24 hours.  He denies recurrent discomfort in his chest.  He does have occasional palpitations which are short-lived.  Home Medications    Current Outpatient Medications  Medication Sig Dispense Refill   ALLERGY RELIEF 10 MG tablet TAKE 1 TABLET BY MOUTH DAILY 90 tablet 0   amLODipine (NORVASC) 5 MG tablet TAKE 1 TABLET BY MOUTH DAILY 30 tablet 11   aspirin 81 MG EC tablet Take 81 mg by mouth daily.     atorvastatin (LIPITOR) 80 MG tablet Take 1 tablet (80 mg total) by mouth daily. 90 tablet 3   azelastine (ASTELIN) 0.1 % nasal spray Place 1 spray into both nostrils 2 (two) times daily. Use in each nostril as directed 30 mL 0   benzonatate (TESSALON) 100 MG capsule Take 1 capsule (100 mg total) by mouth 3 (three) times daily as needed. 30 capsule 0   cyanocobalamin (,VITAMIN B-12,) 1000 MCG/ML injection Inject into the muscle. weekly     ezetimibe (ZETIA) 10 MG tablet TAKE 1 TABLET BY MOUTH DAILY 90 tablet 3   fenofibrate 160 MG tablet TAKE 1 TABLET BY MOUTH DAILY 30 tablet 11   fluticasone (FLONASE) 50 MCG/ACT nasal spray USE 2 SPRAYS IN BOTH  NOSTRILS DAILY 48 g 2   metoprolol succinate (TOPROL-XL) 50 MG 24 hr tablet Take 3 tablets (150 mg total) by mouth daily. 270 tablet 3   ticagrelor (BRILINTA) 90 MG TABS tablet Take 90 mg by mouth 2 (two) times daily.     No current facility-administered medications for this visit.     Family History    Family History  Problem Relation Age of Onset   Stroke Father    Transient ischemic attack Father    Hypertension Father 22       CABG   Liver cancer Maternal Grandmother    Heart disease Other    He indicated that his mother is alive. He indicated that his father is deceased. He indicated that the status of his maternal grandmother is unknown. He indicated that the status of his other is unknown.  Social History    Social History    Socioeconomic History   Marital status: Married    Spouse name: Not on file   Number of children: 3   Years of education: Not on file   Highest education level: Not on file  Occupational History   Occupation: Investment banker, corporate: [employer]  Tobacco Use   Smoking status: Never   Smokeless tobacco: Never   Tobacco comments:    quit 25 yrs ago  Substance and Sexual Activity   Alcohol use: No   Drug use: No   Sexual activity: Yes  Other Topics Concern   Not on file  Social History Narrative   Lives with wife   Social Determinants of Health   Financial  Resource Strain: Not on file  Food Insecurity: Not on file  Transportation Needs: Not on file  Physical Activity: Not on file  Stress: Not on file  Social Connections: Unknown (01/21/2022)   Received from [facility]   Social Network    Social Network: Not on file  Intimate Partner Violence: Unknown (12/13/2021)   Received from [facility], [facility]   HITS    Physically Hurt: Not on file    Insult or Talk Down To: Not on file    Threaten Physical Harm: Not on file    Scream or Curse: Not on file     Review of Systems    General:  No chills, fever, night sweats or weight changes.  Cardiovascular:  No chest pain, dyspnea on exertion, edema, orthopnea, positive for brief palpitations, paroxysmal nocturnal dyspnea. Dermatological: No rash, lesions/masses Respiratory: No cough, dyspnea Urologic: No hematuria, dysuria Abdominal:   No nausea, vomiting, diarrhea, bright red blood per rectum, melena, or hematemesis Neurologic:  No visual changes, wkns, changes in mental status. All other systems reviewed and are otherwise negative except as noted above.  EKG Interpretation Date/Time:  Tuesday April 09 2023 15:52:39 EDT Ventricular Rate:  65 PR Interval:  202 QRS Duration:  76 QT Interval:  380 QTC Calculation: 395 R Axis:   -28  Text Interpretation: Normal sinus rhythm ST & T wave  abnormality, consider lateral ischemia When compared with ECG of 10-Aug-2021 10:00, QRS axis Shifted left T wave inversion now evident in Lateral leads Confirmed by Joni Reining [phone]) on 04/09/2023 5:18:35 PM    Physical Exam    VS:  BP 118/74   Pulse 65   Ht 6\' 2"  (1.88 m)   Wt 176 lb 12.8 oz (80.2 kg)   SpO2 97%   BMI 22.70 kg/m  , BMI Body mass index is 22.7 kg/m.     GEN: Well nourished, well developed, in no acute distress. HEENT: normal. Neck: Supple, no JVD, carotid bruits, or masses. Cardiac: RRR, no murmurs, rubs, or gallops. No clubbing, cyanosis, edema.  Radials/DP/PT 2+ and equal bilaterally.  Respiratory:  Respirations regular and unlabored, clear to auscultation bilaterally. GI: Soft, nontender, nondistended, BS + x 4. MS: no deformity or atrophy.  Right radial cardiac cath insertion site well-healed, some old bruising is noted. Skin: warm and dry, no rash. Neuro:  Strength and sensation are intact. Psych: Normal affect.  EKG Interpretation Date/Time:  Tuesday April 09 2023 15:52:39 EDT Ventricular Rate:  65 PR Interval:  202 QRS Duration:  76 QT Interval:  380 QTC Calculation: 395 R Axis:   -28  Text Interpretation: Normal sinus rhythm ST & T wave abnormality, consider lateral ischemia When compared with ECG of 10-Aug-2021 10:00, QRS axis Shifted left T wave inversion now evident in Lateral leads Confirmed by Joni Reining [phone]) on 04/09/2023 5:18:35 PM   Lab Results  Component Value Date   WBC 5.7 04/06/2022   HGB 14.7 04/06/2022   HCT 43.8 04/06/2022   MCV 90.7 04/06/2022   PLT 256.0 04/06/2022   Lab Results  Component Value Date   CREATININE 1.30 06/12/2021   BUN 23 06/12/2021   NA 138 06/12/2021   K 4.3 06/12/2021   CL 101 06/12/2021   CO2 29 06/12/2021   Lab Results  Component Value Date   ALT 21 04/06/2022   AST 24 04/06/2022   ALKPHOS 56 04/06/2022   BILITOT 0.5 04/06/2022   Lab Results  Component Value Date  CHOL 141  06/12/2022   HDL 46 06/12/2022   LDLCALC 77 06/12/2022   LDLDIRECT 90.2 10/27/2009   TRIG 94 06/12/2022   CHOLHDL 3.1 06/12/2022    Lab Results  Component Value Date   HGBA1C 6.0 04/06/2022     Review of Prior Studies EKG Interpretation Date/Time:  Tuesday April 09 2023 15:52:39 EDT Ventricular Rate:  65 PR Interval:  202 QRS Duration:  76 QT Interval:  380 QTC Calculation: 395 R Axis:   -28  Text Interpretation: Normal sinus rhythm ST & T wave abnormality, consider lateral ischemia When compared with ECG of 10-Aug-2021 10:00, QRS axis Shifted left T wave inversion now evident in Lateral leads Confirmed by Joni Reining [phone]) on 04/09/2023 5:18:35 PM  EKG dated April 02, 2023 from [facility], revealed normal sinus rhythm with left axis deviation with ST-T wave abnormality with lateral ischemia noted. Please see scanned EKGs from July 20th, 2024 through April 02, 2023 for more details.   Please see scanned cardiac catheterization report from [facility], [place] for 04/01/2023.  Assessment & Plan   1.  Coronary artery disease: Status post NSTEMI while visiting [place] requiring urgent cardiac catheterization as outlined above.  He is status post DES to the first marginal branch of the left circumflex.  He has multivessel disease elsewhere, up to 67% in the right coronary artery.  He is now on DAPT with aspirin and Brilinta and tolerating it well.  He is interested in cardiac rehab but as it is only been 1 week I would like him to wait at least 30 days follow-up with Dr. Wyline Mood, and be released at her discretion.  Continue metoprolol as directed.  2.  Hypercholesterolemia: Goal of LDL less than 70.  Labs reviewed from [place] have different scales and therefore uninterpretable compared to [place] calculation.  Please review labs on scanned documents in epic.  I did change him from pravastatin 80 mg daily to atorvastatin 80 mg  daily.  He is counseled on a low-cholesterol diet.  Would hold off on exertional purposeful exercise until cleared by Dr. Wyline Mood.  Walking is fine.   3.  Hypertension: Blood pressure is very well controlled today in the office.  Continue amlodipine as directed.  He is not on ACE inhibitor.  He does have a single kidney.   Signed, Bettey Mare. Liborio Nixon, ANP, AACC   04/09/2023 5:18 PM      Office [phone] Fax [phone]  Notice: This dictation was prepared with Dragon dictation along with smaller phrase technology. Any transcriptional errors that result from this process are unintentional and may not be corrected upon review.

## 2023-04-09 NOTE — Telephone Encounter (Signed)
Patient returned LPN's call and has been scheduled for this afternoon, 07/30 to see NP, Joni Reining at 3:35 pm regarding this.

## 2023-04-09 NOTE — Telephone Encounter (Signed)
Returned pt call to inform him that his message will be sent to his provider and nurse and to schedule an appt. LVM for pt to call back.  Patient is calling to report he had a heart attack while out of the country on 03/30/23. He reports he has 5 blockages and a stent was placed to the 95% blockage on 07/22. Per the patient one of the other four blockages is at 60% and the remaining three are at 40%. He was put on the blood thinner Tarao-ticagrelor 90 mg twice a day upon admission and reports his CP subsided once blood thinner was started. He has not had any CP since stent was placed. Patient has all records given upon discharge and just got back yesterday due to not being able to fly for a week after the procedure. He is requesting a callback to discuss next steps. Please advise.

## 2023-04-09 NOTE — Telephone Encounter (Signed)
Patient is calling to report he had a heart attack while [place]. He reports he has 5 blockages and a stent was placed to the 95% blockage on 07/22. Per the patient one of the other four blockages is at 60% and the remaining three are at 40%. He was put on the blood thinner Tarao-ticagrelor 90 mg twice a day upon admission and reports his CP subsided once blood thinner was started. He has not had any CP since stent was placed. Patient has all records given upon discharge and just got back yesterday due to not being able to fly for a week after the procedure. He is requesting a callback to discuss next steps. Please advise.

## 2023-04-09 NOTE — Patient Instructions (Signed)
Medication Instructions:  Start Atorvastatin 80 mg ( Take 1 tablet Daily). Stop pravastatin. *If you need a refill on your cardiac medications before your next appointment, please call your pharmacy*   Lab Work: No labs If you have labs (blood work) drawn today and your tests are completely normal, you will receive your results only by: MyChart Message (if you have MyChart) OR A paper copy in the mail If you have any lab test that is abnormal or we need to change your treatment, we will call you to review the results.   Testing/Procedures: No Testing   Follow-Up: At [facility], you and your health needs are our priority.  As part of our continuing mission to provide you with exceptional heart care, we have created designated Provider Care Teams.  These Care Teams include your primary Cardiologist (physician) and Advanced Practice Providers (APPs -  Physician Assistants and Nurse Practitioners) who all work together to provide you with the care you need, when you need it.  We recommend signing up for the patient portal called "MyChart".  Sign up information is provided on this After Visit Summary.  MyChart is used to connect with patients for Virtual Visits (Telemedicine).  Patients are able to view lab/test results, encounter notes, upcoming appointments, etc.  Non-urgent messages can be sent to your provider as well.   To learn more about what you can do with MyChart, go to [email or web address].    Your next appointment:   Keep Scheduled Appointment   Provider:   Maisie Fus, MD

## 2023-04-10 ENCOUNTER — Encounter (INDEPENDENT_AMBULATORY_CARE_PROVIDER_SITE_OTHER): Payer: Self-pay

## 2023-04-17 ENCOUNTER — Other Ambulatory Visit (INDEPENDENT_AMBULATORY_CARE_PROVIDER_SITE_OTHER): Payer: 59

## 2023-04-17 DIAGNOSIS — R739 Hyperglycemia, unspecified: Secondary | ICD-10-CM

## 2023-04-17 DIAGNOSIS — E7800 Pure hypercholesterolemia, unspecified: Secondary | ICD-10-CM

## 2023-04-17 DIAGNOSIS — R7989 Other specified abnormal findings of blood chemistry: Secondary | ICD-10-CM

## 2023-04-17 DIAGNOSIS — Z125 Encounter for screening for malignant neoplasm of prostate: Secondary | ICD-10-CM

## 2023-04-17 DIAGNOSIS — E559 Vitamin D deficiency, unspecified: Secondary | ICD-10-CM

## 2023-04-17 LAB — CBC WITH DIFFERENTIAL/PLATELET
Basophils Absolute: 0 10*3/uL (ref 0.0–0.1)
Basophils Relative: 0.7 % (ref 0.0–3.0)
Eosinophils Absolute: 0.2 10*3/uL (ref 0.0–0.7)
Eosinophils Relative: 3.1 % (ref 0.0–5.0)
HCT: 47.3 % (ref 39.0–52.0)
Hemoglobin: 15.3 g/dL (ref 13.0–17.0)
Lymphocytes Relative: 24 % (ref 12.0–46.0)
Lymphs Abs: 1.4 10*3/uL (ref 0.7–4.0)
MCHC: 32.3 g/dL (ref 30.0–36.0)
MCV: 92.5 fl (ref 78.0–100.0)
Monocytes Absolute: 0.7 10*3/uL (ref 0.1–1.0)
Monocytes Relative: 11.1 % (ref 3.0–12.0)
Neutro Abs: 3.6 10*3/uL (ref 1.4–7.7)
Neutrophils Relative %: 61.1 % (ref 43.0–77.0)
Platelets: 310 10*3/uL (ref 150.0–400.0)
RBC: 5.11 Mil/uL (ref 4.22–5.81)
RDW: 13.8 % (ref 11.5–15.5)
WBC: 5.9 10*3/uL (ref 4.0–10.5)

## 2023-04-17 LAB — URINALYSIS, ROUTINE W REFLEX MICROSCOPIC
Bilirubin Urine: NEGATIVE
Hgb urine dipstick: NEGATIVE
Ketones, ur: NEGATIVE
Leukocytes,Ua: NEGATIVE
Nitrite: NEGATIVE
RBC / HPF: NONE SEEN (ref 0–?)
Specific Gravity, Urine: >=1.03 — AB (ref 1.000–1.030)
Total Protein, Urine: NEGATIVE
Urine Glucose: NEGATIVE
Urobilinogen, UA: 0.2 (ref 0.0–1.0)
pH: 6 (ref 5.0–8.0)

## 2023-04-17 LAB — BASIC METABOLIC PANEL
BUN: 26 mg/dL — ABNORMAL HIGH (ref 6–23)
CO2: 30 mEq/L (ref 19–32)
Calcium: 10.4 mg/dL (ref 8.4–10.5)
Chloride: 101 mEq/L (ref 96–112)
Creatinine, Ser: 1.53 mg/dL — ABNORMAL HIGH (ref 0.40–1.50)
GFR: 47.38 mL/min — ABNORMAL LOW (ref >60.00)
Glucose, Bld: 90 mg/dL (ref 70–99)
Potassium: 4.5 mEq/L (ref 3.5–5.1)
Sodium: 140 mEq/L (ref 135–145)

## 2023-04-17 LAB — HEMOGLOBIN A1C: Hgb A1c MFr Bld: 6.1 % (ref 4.6–6.5)

## 2023-04-17 LAB — VITAMIN B12: Vitamin B-12: 949 pg/mL — ABNORMAL HIGH (ref 211–911)

## 2023-04-17 LAB — HEPATIC FUNCTION PANEL
ALT: 27 U/L (ref 0–53)
AST: 30 U/L (ref 0–37)
Albumin: 4.3 g/dL (ref 3.5–5.2)
Alkaline Phosphatase: 57 U/L (ref 39–117)
Bilirubin, Direct: 0.2 mg/dL (ref 0.0–0.3)
Total Bilirubin: 0.7 mg/dL (ref 0.2–1.2)
Total Protein: 6.8 g/dL (ref 6.0–8.3)

## 2023-04-17 LAB — TSH: TSH: 2.6 u[IU]/mL (ref 0.35–5.50)

## 2023-04-17 LAB — VITAMIN D 25 HYDROXY (VIT D DEFICIENCY, FRACTURES): VITD: 61.16 ng/mL (ref 30.00–100.00)

## 2023-04-17 LAB — PSA: PSA: 3.3 ng/mL (ref 0.10–4.00)

## 2023-04-19 ENCOUNTER — Encounter: Payer: Self-pay | Admitting: Internal Medicine

## 2023-04-19 ENCOUNTER — Telehealth: Payer: Self-pay | Admitting: Internal Medicine

## 2023-04-19 DIAGNOSIS — E7800 Pure hypercholesterolemia, unspecified: Secondary | ICD-10-CM

## 2023-04-19 DIAGNOSIS — E559 Vitamin D deficiency, unspecified: Secondary | ICD-10-CM

## 2023-04-19 DIAGNOSIS — R739 Hyperglycemia, unspecified: Secondary | ICD-10-CM

## 2023-04-19 DIAGNOSIS — Z125 Encounter for screening for malignant neoplasm of prostate: Secondary | ICD-10-CM

## 2023-04-19 DIAGNOSIS — R7989 Other specified abnormal findings of blood chemistry: Secondary | ICD-10-CM

## 2023-04-19 DIAGNOSIS — Z0001 Encounter for general adult medical examination with abnormal findings: Secondary | ICD-10-CM

## 2023-04-19 NOTE — Telephone Encounter (Signed)
Patient had labs done prior to his scheduled appointment on 04/24/2023. He noticed there was not a lipid panel ordered for him to do. He would like to know if that order can be put in for him to get done prior to that appointment as well. Best callback is [phone].

## 2023-04-19 NOTE — Addendum Note (Signed)
Addended by: Delsa Grana R on: 04/19/2023 09:30 AM   Modules accepted: Orders

## 2023-04-19 NOTE — Telephone Encounter (Signed)
Order has been placed for pt to get his lipid panel done before visit.

## 2023-04-22 ENCOUNTER — Other Ambulatory Visit (INDEPENDENT_AMBULATORY_CARE_PROVIDER_SITE_OTHER): Payer: 59

## 2023-04-22 DIAGNOSIS — E7800 Pure hypercholesterolemia, unspecified: Secondary | ICD-10-CM

## 2023-04-22 DIAGNOSIS — R7989 Other specified abnormal findings of blood chemistry: Secondary | ICD-10-CM

## 2023-04-22 DIAGNOSIS — Z0001 Encounter for general adult medical examination with abnormal findings: Secondary | ICD-10-CM | POA: Diagnosis not present

## 2023-04-22 DIAGNOSIS — R739 Hyperglycemia, unspecified: Secondary | ICD-10-CM

## 2023-04-22 DIAGNOSIS — E559 Vitamin D deficiency, unspecified: Secondary | ICD-10-CM

## 2023-04-22 DIAGNOSIS — Z125 Encounter for screening for malignant neoplasm of prostate: Secondary | ICD-10-CM

## 2023-04-22 LAB — LIPID PANEL
Cholesterol: 113 mg/dL (ref 0–200)
HDL: 39.6 mg/dL (ref >39.00)
LDL Cholesterol: 56 mg/dL (ref 0–99)
NonHDL: 73.45
Total CHOL/HDL Ratio: 3
Triglycerides: 88 mg/dL (ref 0.0–149.0)
VLDL: 17.6 mg/dL (ref 0.0–40.0)

## 2023-04-22 LAB — T3, FREE: T3, Free: 3.6 pg/mL (ref 2.3–4.2)

## 2023-04-22 LAB — T4, FREE: Free T4: 0.78 ng/dL (ref 0.60–1.60)

## 2023-04-22 LAB — TSH: TSH: 2.41 u[IU]/mL (ref 0.35–5.50)

## 2023-04-22 LAB — VITAMIN D 25 HYDROXY (VIT D DEFICIENCY, FRACTURES): VITD: 61.24 ng/mL (ref 30.00–100.00)

## 2023-04-22 LAB — TESTOSTERONE: Testosterone: 438.87 ng/dL (ref 300.00–890.00)

## 2023-04-23 ENCOUNTER — Ambulatory Visit: Payer: 59 | Admitting: Adult Health

## 2023-04-24 ENCOUNTER — Telehealth: Payer: Self-pay | Admitting: Internal Medicine

## 2023-04-24 ENCOUNTER — Ambulatory Visit (INDEPENDENT_AMBULATORY_CARE_PROVIDER_SITE_OTHER): Payer: 59 | Admitting: Internal Medicine

## 2023-04-24 VITALS — BP 104/68 | HR 67 | Temp 97.7°F | Ht 74.0 in | Wt 175.8 lb

## 2023-04-24 DIAGNOSIS — I252 Old myocardial infarction: Secondary | ICD-10-CM

## 2023-04-24 DIAGNOSIS — N1831 Chronic kidney disease, stage 3a: Secondary | ICD-10-CM | POA: Diagnosis not present

## 2023-04-24 DIAGNOSIS — R972 Elevated prostate specific antigen [PSA]: Secondary | ICD-10-CM | POA: Diagnosis not present

## 2023-04-24 DIAGNOSIS — R739 Hyperglycemia, unspecified: Secondary | ICD-10-CM

## 2023-04-24 DIAGNOSIS — Z0000 Encounter for general adult medical examination without abnormal findings: Secondary | ICD-10-CM

## 2023-04-24 DIAGNOSIS — E7800 Pure hypercholesterolemia, unspecified: Secondary | ICD-10-CM

## 2023-04-24 DIAGNOSIS — Z0001 Encounter for general adult medical examination with abnormal findings: Secondary | ICD-10-CM

## 2023-04-24 NOTE — Telephone Encounter (Signed)
Pt c/o Shortness Of Breath: STAT if SOB developed within the last 24 hours or pt is noticeably SOB on the phone  1. Are you currently SOB (can you hear that pt is SOB on the phone)? Yes but not as bad as it usually is because it's more so coming and going.  2. How long have you been experiencing SOB? Since last office visit   3. Are you SOB when sitting or when up moving around? Moving around   4. Are you currently experiencing any other symptoms? Fatigue and dizziness and it's really bad when climbing multiple stairs.

## 2023-04-24 NOTE — Patient Instructions (Signed)
Please continue all other medications as before, and refills have been done if requested.  Please have the pharmacy call with any other refills you may need.  Please continue your efforts at being more active, low cholesterol diet, and weight control.  You are otherwise up to date with prevention measures today.  Please keep your appointments with your specialists as you may have planned - Sept 4 cardiology  Please remember to have your lab testing done in 6 months  Please make an Appointment to return for your 1 year visit, or sooner if needed, with Lab testing by Appointment as well, to be done about 3-5 days before at the FIRST FLOOR Lab (so this is for TWO appointments - please see the scheduling desk as you leave)

## 2023-04-24 NOTE — Telephone Encounter (Signed)
Returned call to pt. He is having SOB since July 30, 24. SOB comes and goes and lasts about 30 mins. When he climbs several sets of stairs he gets increasingly winded with each set of stairs he climbs and when he gets to the top he is dizziness. He has been on Ticagrelor since he has been out of the hospital. He wanted to start cardiac rehab but was told at his appointment it was too early. He states he needs to be able to climb stairs but can't with this continued problem. Advised pt this message will be sent to Dr. Wyline Mood and her nurse for recommendation. Verbalized understanding.

## 2023-04-24 NOTE — Progress Notes (Signed)
Patient ID: Brandon Sanders, male   DOB: 1956/05/07, 67 y.o.   MRN: [id number]         Chief Complaint:: wellness exam and hld, ckd3a,  elevated psa velocity, recent Acute MI CAD       HPI:  Brandon Sanders is a 67 y.o. male here for wellness exam; for tdap and shingrix and  flu shot and prevnar 20 at pharmacy, declines colonscopy due to recent events o/w up tod ate                        Also s/p acute MI 7/20 s/p stent 7/22, recently, wit hf/u with Dr Wyline Mood on return, now on brilinta   Still does not feel well still with dizziness, weakness noticeable to him, as well as other such as yesterday walking up 3 flights of stairs [place] the [place] and "almost passed out". Unable to climb stairs or ladders he needs to for his work, currently getting son to help him out. No chest pain but has Has pain with exertion at the left scapula area, better with rest and also tylenol, that he has also taken for recurring right neck and shoulder pain and upper and mid chest pain  The previous right shoulder and upper chest pain has resolved post MI and stent except, but has continued specifically the right neck pain.  Has f/u tomorrow with neurology Covenent Spine.  Is s/p ESI to right neck x 1 already, may get another tomorrow.  Noted pravastatin changed to lipitor.  Has cardiology f/u Sept 4.  Has not been referred yet for cardiac rehab. But plans to see cardiology first.     Wt Readings from Last 3 Encounters:  04/24/23 175 lb 12.8 oz (79.7 kg)  04/09/23 176 lb 12.8 oz (80.2 kg)  05/25/22 182 lb (82.6 kg)   BP Readings from Last 3 Encounters:  04/24/23 104/68  04/09/23 118/74  05/25/22 122/72   Immunization History  Administered Date(s) Administered   H1N1 07/12/2008   Influenza Whole 07/12/2008   Influenza, High Dose Seasonal PF 06/09/2015   Influenza, Seasonal, Injecte, Preservative Fre 06/03/2014   Influenza,inj,Quad PF,6+ Mos 06/10/2014, 06/19/2018, 05/21/2019, 05/26/2020   Influenza,inj,quad, With  Preservative 06/10/2017   Influenza-Unspecified 07/12/2011, 06/16/2012, 06/10/2014, 07/12/2015, 06/19/2018   PFIZER(Purple Top)SARS-COV-2 Vaccination 10/21/2019, 12/09/2019, 07/26/2020   Td 10/11/2000   Tdap 12/26/2010   Unspecified SARS-COV-2 Vaccination 11/18/2019   Health Maintenance Due  Topic Date Due   DTaP/Tdap/Td (3 - Td or Tdap) 12/25/2020      Past Medical History:  Diagnosis Date   Carpal tunnel syndrome, bilateral    Chronic renal insufficiency    COLITIS 12/17/2007   DJD of shoulder    Erectile dysfunction    managed with injections; no response with Viagra/Cialis   HTN (hypertension)    Hyperlipidemia    PLANTAR FASCIITIS 09/05/2010   Renal cell cancer (HCC)    currently in clinical trial using Afinitor   Solitary kidney, acquired    SYNCOPE 09/05/2010   Past Surgical History:  Procedure Laterality Date   NEPHRECTOMY     Right   s.p right CTS release     s/p right ulnar nerve release     VASECTOMY      reports that he has never smoked. He has never used smokeless tobacco. He reports that he does not drink alcohol and does not use drugs. family history includes Heart disease in an other family member; Hypertension (  age of onset: 15) in his father; Liver cancer in his maternal grandmother; Stroke in his father; Transient ischemic attack in his father. No Known Allergies Current Outpatient Medications on File Prior to Visit  Medication Sig Dispense Refill   amLODipine (NORVASC) 5 MG tablet TAKE 1 TABLET BY MOUTH DAILY 30 tablet 11   aspirin 81 MG EC tablet Take 81 mg by mouth daily.     atorvastatin (LIPITOR) 80 MG tablet Take 1 tablet (80 mg total) by mouth daily. 90 tablet 3   cyanocobalamin (,VITAMIN B-12,) 1000 MCG/ML injection Inject into the muscle. weekly     ezetimibe (ZETIA) 10 MG tablet TAKE 1 TABLET BY MOUTH DAILY 90 tablet 3   fenofibrate 160 MG tablet TAKE 1 TABLET BY MOUTH DAILY 30 tablet 11   fluticasone (FLONASE) 50 MCG/ACT nasal spray USE 2  SPRAYS IN BOTH  NOSTRILS DAILY 48 g 2   metoprolol succinate (TOPROL-XL) 50 MG 24 hr tablet Take 3 tablets (150 mg total) by mouth daily. 270 tablet 3   ALLERGY RELIEF 10 MG tablet TAKE 1 TABLET BY MOUTH DAILY 90 tablet 0   No current facility-administered medications on file prior to visit.        ROS:  All others reviewed and negative.  Objective        PE:  BP 104/68 (BP Location: Left Arm, Patient Position: Sitting, Cuff Size: Normal)   Pulse 67   Temp 97.7 F (36.5 C) (Oral)   Ht 6\' 2"  (1.88 m)   Wt 175 lb 12.8 oz (79.7 kg)   SpO2 97%   BMI 22.57 kg/m                 Constitutional: Pt appears in NAD               HENT: Head: NCAT.                Right Ear: External ear normal.                 Left Ear: External ear normal.                Eyes: . Pupils are equal, round, and reactive to light. Conjunctivae and EOM are normal               Nose: without d/c or deformity               Neck: Neck supple. Gross normal ROM               Cardiovascular: Normal rate and regular rhythm.                 Pulmonary/Chest: Effort normal and breath sounds without rales or wheezing.                Abd:  Soft, NT, ND, + BS, no organomegaly               Neurological: Pt is alert. At baseline orientation, motor grossly intact               Skin: Skin is warm. No rashes, no other new lesions, LE edema - none               Psychiatric: Pt behavior is normal without agitation   Micro: none  Cardiac tracings I have personally interpreted today:  none  Pertinent Radiological findings (summarize): none   Lab Results  Component Value Date   WBC  5.9 04/17/2023   HGB 15.3 04/17/2023   HCT 47.3 04/17/2023   PLT 310.0 04/17/2023   GLUCOSE 90 04/17/2023   CHOL 113 04/22/2023   TRIG 88.0 04/22/2023   HDL 39.60 04/22/2023   LDLDIRECT 90.2 10/27/2009   LDLCALC 56 04/22/2023   ALT 27 04/17/2023   AST 30 04/17/2023   NA 140 04/17/2023   K 4.5 04/17/2023   CL 101 04/17/2023   CREATININE  1.53 (H) 04/17/2023   BUN 26 (H) 04/17/2023   CO2 30 04/17/2023   TSH 2.41 04/22/2023   PSA 3.30 04/17/2023   INR 0.92 02/16/2011   HGBA1C 6.1 04/17/2023   MICROALBUR 1.1 04/05/2016   Assessment/Plan:  Brandon Sanders is a 67 y.o. White or Caucasian [1] male with  has a past medical history of Carpal tunnel syndrome, bilateral, Chronic renal insufficiency, COLITIS (12/17/2007), DJD of shoulder, Erectile dysfunction, HTN (hypertension), Hyperlipidemia, PLANTAR FASCIITIS (09/05/2010), Renal cell cancer (HCC), Solitary kidney, acquired, and SYNCOPE (09/05/2010).  Encounter for well adult exam with abnormal findings Age and sex appropriate education and counseling updated with regular exercise and diet Referrals for preventative services - none needed Immunizations addressed - for tdap, shingrix, flu shot, prevnar at pharmacy Smoking counseling  - none needed Evidence for depression or other mood disorder - irritable today with hx of recent mi Most recent labs reviewed. I have personally reviewed and have noted: 1) the patient's medical and social history 2) The patient's current medications and supplements 3) The patient's height, weight, and BMI have been recorded in the chart   History of acute myocardial infarction Recent in [place] s/p stent - for cardiology f/u as planned, likely to start card rehab after I suspect, continue brinlinta  HYPERCHOLESTEROLEMIA Lab Results  Component Value Date   LDLCALC 56 04/22/2023   Stable, pt to continue current statin lipitor 80 every day, zetia 10 qd   CKD (chronic kidney disease) stage 3, GFR 30-59 ml/min (HCC) Lab Results  Component Value Date   CREATININE 1.53 (H) 04/17/2023   Stable overall, cont to avoid nephrotoxins  Hyperglycemia Lab Results  Component Value Date   HGBA1C 6.1 04/17/2023   Stable, pt to continue current medical treatment  - diet, wt control   Increased prostate specific antigen (PSA) velocity Lab Results   Component Value Date   PSA 3.30 04/17/2023   PSA 1.15 06/12/2021   PSA 2.01 02/16/2021   Mild, for f/u lab at 6 mo  Followup: Return in about 1 year (around 04/23/2024).  Oliver Barre, MD 04/27/2023 9:01 PM [facility]

## 2023-04-25 MED ORDER — CLOPIDOGREL BISULFATE 75 MG PO TABS: ORAL_TABLET | ORAL | 0 refills | Status: DC

## 2023-04-25 NOTE — Telephone Encounter (Signed)
Returned call to pt in regards to Dr. Pollyann Kennedy below. Pt verbalized understanding. Will send this to pharmacy for assistance with the order.   Please call Brandon Sanders and let him know we can try switching his ticagrelor which can be associated with shortness of breath. Please stop his brilinta, start plavix 300 mg for the next day, then 75 mg daily thereafter. Pharmacy can help with this order.

## 2023-04-25 NOTE — Telephone Encounter (Signed)
Rx sent 

## 2023-04-25 NOTE — Addendum Note (Signed)
Addended by: Malena Peer D on: 04/25/2023 12:29 PM   Modules accepted: Orders

## 2023-04-27 ENCOUNTER — Encounter: Payer: Self-pay | Admitting: Internal Medicine

## 2023-04-27 DIAGNOSIS — I252 Old myocardial infarction: Secondary | ICD-10-CM | POA: Insufficient documentation

## 2023-04-27 NOTE — Assessment & Plan Note (Signed)
Lab Results  Component Value Date   CREATININE 1.53 (H) 04/17/2023   Stable overall, cont to avoid nephrotoxins

## 2023-04-27 NOTE — Assessment & Plan Note (Addendum)
Recent in [place] s/p stent - for cardiology f/u as planned, likely to start card rehab after I suspect, continue brinlinta

## 2023-04-27 NOTE — Assessment & Plan Note (Signed)
Age and sex appropriate education and counseling updated with regular exercise and diet Referrals for preventative services - none needed Immunizations addressed - for tdap, shingrix, flu shot, prevnar at pharmacy Smoking counseling  - none needed Evidence for depression or other mood disorder - irritable today with hx of recent mi Most recent labs reviewed. I have personally reviewed and have noted: 1) the patient's medical and social history 2) The patient's current medications and supplements 3) The patient's height, weight, and BMI have been recorded in the chart

## 2023-04-27 NOTE — Assessment & Plan Note (Signed)
Lab Results  Component Value Date   LDLCALC 56 04/22/2023   Stable, pt to continue current statin lipitor 80 every day, zetia 10 qd

## 2023-04-27 NOTE — Assessment & Plan Note (Signed)
Lab Results  Component Value Date   HGBA1C 6.1 04/17/2023   Stable, pt to continue current medical treatment  - diet, wt control

## 2023-04-27 NOTE — Assessment & Plan Note (Signed)
Lab Results  Component Value Date   PSA 3.30 04/17/2023   PSA 1.15 06/12/2021   PSA 2.01 02/16/2021   Mild, for f/u lab at 6 mo

## 2023-04-29 ENCOUNTER — Other Ambulatory Visit: Payer: Self-pay

## 2023-04-29 ENCOUNTER — Encounter (HOSPITAL_BASED_OUTPATIENT_CLINIC_OR_DEPARTMENT_OTHER): Payer: Self-pay

## 2023-04-29 ENCOUNTER — Emergency Department (HOSPITAL_BASED_OUTPATIENT_CLINIC_OR_DEPARTMENT_OTHER)
Admission: EM | Admit: 2023-04-29 | Discharge: 2023-04-29 | Disposition: A | Discharge status: Home / Self Care | Payer: 59 | Attending: Emergency Medicine | Admitting: Emergency Medicine

## 2023-04-29 ENCOUNTER — Telehealth: Payer: Self-pay | Admitting: Internal Medicine

## 2023-04-29 ENCOUNTER — Emergency Department (HOSPITAL_BASED_OUTPATIENT_CLINIC_OR_DEPARTMENT_OTHER): Payer: 59 | Admitting: Radiology

## 2023-04-29 DIAGNOSIS — I10 Essential (primary) hypertension: Secondary | ICD-10-CM | POA: Insufficient documentation

## 2023-04-29 DIAGNOSIS — M549 Dorsalgia, unspecified: Secondary | ICD-10-CM | POA: Diagnosis not present

## 2023-04-29 DIAGNOSIS — Z79899 Other long term (current) drug therapy: Secondary | ICD-10-CM | POA: Insufficient documentation

## 2023-04-29 DIAGNOSIS — R079 Chest pain, unspecified: Secondary | ICD-10-CM | POA: Diagnosis not present

## 2023-04-29 DIAGNOSIS — Z7982 Long term (current) use of aspirin: Secondary | ICD-10-CM | POA: Insufficient documentation

## 2023-04-29 DIAGNOSIS — M62830 Muscle spasm of back: Secondary | ICD-10-CM

## 2023-04-29 DIAGNOSIS — Z7902 Long term (current) use of antithrombotics/antiplatelets: Secondary | ICD-10-CM | POA: Insufficient documentation

## 2023-04-29 LAB — BASIC METABOLIC PANEL
Anion gap: 10 (ref 5–15)
BUN: 22 mg/dL (ref 8–23)
CO2: 27 mmol/L (ref 22–32)
Calcium: 10.5 mg/dL — ABNORMAL HIGH (ref 8.9–10.3)
Chloride: 100 mmol/L (ref 98–111)
Creatinine, Ser: 1.45 mg/dL — ABNORMAL HIGH (ref 0.61–1.24)
GFR, Estimated: 53 mL/min — ABNORMAL LOW (ref >60)
Glucose, Bld: 125 mg/dL — ABNORMAL HIGH (ref 70–99)
Potassium: 4.2 mmol/L (ref 3.5–5.1)
Sodium: 137 mmol/L (ref 135–145)

## 2023-04-29 LAB — TROPONIN I (HIGH SENSITIVITY)
Troponin I (High Sensitivity): 10 ng/L (ref <18)
Troponin I (High Sensitivity): 10 ng/L (ref <18)

## 2023-04-29 LAB — CBC
HCT: 44.6 % (ref 39.0–52.0)
Hemoglobin: 15 g/dL (ref 13.0–17.0)
MCH: 30.4 pg (ref 26.0–34.0)
MCHC: 33.6 g/dL (ref 30.0–36.0)
MCV: 90.5 fL (ref 80.0–100.0)
Platelets: 304 10*3/uL (ref 150–400)
RBC: 4.93 MIL/uL (ref 4.22–5.81)
RDW: 12.6 % (ref 11.5–15.5)
WBC: 6.8 10*3/uL (ref 4.0–10.5)
nRBC: 0 % (ref 0.0–0.2)

## 2023-04-29 MED ORDER — NITROGLYCERIN 0.4 MG SL SUBL
0.4000 mg | SUBLINGUAL_TABLET | SUBLINGUAL | 0 refills | Status: AC | PRN
Start: 2023-04-29 — End: ?

## 2023-04-29 MED ORDER — METHOCARBAMOL 500 MG PO TABS: 1000.0000 mg | ORAL_TABLET | Freq: Four times a day (QID) | ORAL | 0 refills | Status: DC

## 2023-04-29 MED ORDER — OXYCODONE-ACETAMINOPHEN 5-325 MG PO TABS
1.0000 | ORAL_TABLET | Freq: Once | ORAL | Status: AC
  Administered 2023-04-29: 1 via ORAL
  Filled 2023-04-29 (×2): qty 1

## 2023-04-29 NOTE — Telephone Encounter (Signed)
Patient states he was having pain in his back and now its radiating to his chest. Left arm feels weak and heavy. He is going to ED.

## 2023-04-29 NOTE — Telephone Encounter (Signed)
Pt c/o of Chest Pain: STAT if CP now or developed within 24 hours  1. Are you having CP right now? Slightly   2. Are you experiencing any other symptoms (ex. SOB, nausea, vomiting, sweating)? No  3. How long have you been experiencing CP? A few days   4. Is your CP continuous or coming and going? Coming and going   5. Have you taken Nitroglycerin? No ?

## 2023-04-29 NOTE — ED Triage Notes (Signed)
Pt to ED c/o left side sharp chest pain and back pain x 4 days. Reports intermittent in nature. Denies SHOB. Reports symptoms feel similar to past heart attack.   Heart attack and Stent placed 7/20

## 2023-04-29 NOTE — ED Provider Notes (Signed)
5:25 PM Patient seen in conjunction with Barrett PA-C.  In short, patient with MI while traveling in [place] about a month ago.  Symptoms at that time were several weeks of right-sided neck pain which went into the shoulder and eventually into the chest and across the chest.  He ended up with a stent in the circumflex.  He has seen cardiology since return.  He is currently on dual antiplatelet therapy with Plavix.  He was taken off Brilinta due to ongoing shortness of breath.  Today his symptoms are several days of tenderness and pain along the left scapula with radiation to the anterior left chest.  This is described as a discomfort.  On my history, patient reports that the symptoms are actually better when he gets up and moves around and are nonexertional.  He states that he drove to [place] the other day and was in a lot of discomfort as certain positions and sitting up makes the pain worse.  He denies associated vomiting or diaphoresis.  I spoke with on-call cardiology after discussing patient with Dr. Karene Fry.  We reviewed EKG, troponins.  We discussed patient presentation.  They state that they are reassured that he is not describing the same symptoms of previous MI (as detailed above).  They are okay with patient following up as planned in early September.  They would ensure that he has some nitroglycerin at home to try.   Patient will be given some pain medication for home.   Overall, again symptoms sound very atypical.  He has a reproducible element especially over his left scapular area where he has palpable spasm.  Lung sounds are clear.  Heart is normal rate and regular rhythm.  EKG and symptoms are not overly concerning for pericarditis.  He looks very well.  Vital signs are stable.  BP 125/83   Pulse 62   Temp (!) 97.5 F (36.4 C)   Resp 18   Ht 6\' 2"  (1.88 m)   Wt 79.4 kg   SpO2 96%   BMI 22.47 kg/m Renne Crigler, Cordelia Poche 04/29/23 1729    Ernie Avena, MD 04/29/23 2200

## 2023-04-29 NOTE — ED Notes (Signed)
Discharge instructions, follow up care, and prescriptions reviewed and explained, pt verbalized understanding and had no further questions on d/c. Pt caox4, ambulatory, NAD on d/c.  

## 2023-04-29 NOTE — ED Provider Notes (Signed)
[facility] AT [facility] Provider Note   CSN: [id number] Arrival date & time: 04/29/23  1342     History  Chief Complaint  Patient presents with   Chest Pain    Brandon Sanders is a 67 y.o. male with pmhx of NSTEMI requiring stent, HTN and HLD presenting with back pain radiating to the left arm. Per triage note, pt stated back was similar to prior MI. In the room, pt reporting this pain is not similar to prior cardiac event. Pain is worse when sitting up, better when laying down, worse with movements. Pt is not worse w/ deep breaths. He states pain has been persistent since 7/20, but worsened the last 2-3 days, pain is sharp and shooting, episodic in nature. He has tried Tylenol for tx, was not helped.    Chest Pain      Home Medications Prior to Admission medications   Medication Sig Start Date End Date Taking? Authorizing Provider  ALLERGY RELIEF 10 MG tablet TAKE 1 TABLET BY MOUTH DAILY 04/02/22   Corwin Levins, MD  amLODipine (NORVASC) 5 MG tablet TAKE 1 TABLET BY MOUTH DAILY 12/24/22   Corwin Levins, MD  aspirin 81 MG EC tablet Take 81 mg by mouth daily.    [provider]  atorvastatin (LIPITOR) 80 MG tablet Take 1 tablet (80 mg total) by mouth daily. 04/09/23 07/08/23  Jodelle Gross, NP  clopidogrel (PLAVIX) 75 MG tablet START 24 hours after last Brilinta dose. Take 4 tablets (300mg ) by mouth once. Then start taking 1 tablet (75mg ) by mouth daily 24 hours thereafter. 04/25/23   Maisie Fus, MD  cyanocobalamin (,VITAMIN B-12,) 1000 MCG/ML injection Inject into the muscle. weekly 07/24/21   [provider]  ezetimibe (ZETIA) 10 MG tablet TAKE 1 TABLET BY MOUTH DAILY 10/26/22   Maisie Fus, MD  fenofibrate 160 MG tablet TAKE 1 TABLET BY MOUTH DAILY 01/14/23   Corwin Levins, MD  fluticasone [facility]) 50 MCG/ACT nasal spray USE 2 SPRAYS IN BOTH  NOSTRILS DAILY 06/06/21   Corwin Levins, MD  metoprolol succinate (TOPROL-XL) 50 MG  24 hr tablet Take 3 tablets (150 mg total) by mouth daily. 05/25/22   Maisie Fus, MD      Allergies    Patient has no known allergies.    Review of Systems   Review of Systems  Cardiovascular:  Positive for chest pain.    Physical Exam Updated Vital Signs BP 125/83   Pulse 62   Temp (!) 97.5 F (36.4 C)   Resp 18   Ht 6\' 2"  (1.88 m)   Wt 79.4 kg   SpO2 96%   BMI 22.47 kg/m  Physical Exam Vitals and nursing note reviewed.  Constitutional:      General: He is not in acute distress.    Appearance: He is not toxic-appearing.  HENT:     Head: Normocephalic and atraumatic.  Eyes:     General: No scleral icterus.    Conjunctiva/sclera: Conjunctivae normal.  Cardiovascular:     Rate and Rhythm: Normal rate and regular rhythm.     Pulses: Normal pulses.     Heart sounds: Normal heart sounds.  Pulmonary:     Effort: Pulmonary effort is normal. No respiratory distress.     Breath sounds: Normal breath sounds.  Abdominal:     General: Abdomen is flat. Bowel sounds are normal.     Palpations: Abdomen is soft.  Tenderness: There is no abdominal tenderness.  Skin:    General: Skin is warm and dry.     Findings: No lesion.  Neurological:     General: No focal deficit present.     Mental Status: He is alert and oriented to person, place, and time. Mental status is at baseline.     ED Results / Procedures / Treatments   Labs (all labs ordered are listed, but only abnormal results are displayed) Labs Reviewed  BASIC METABOLIC PANEL - Abnormal; Notable for the following components:      Result Value   Glucose, Bld 125 (*)    Creatinine, Ser 1.45 (*)    Calcium 10.5 (*)    GFR, Estimated 53 (*)    All other components within normal limits  CBC  TROPONIN I (HIGH SENSITIVITY)  TROPONIN I (HIGH SENSITIVITY)    EKG EKG Interpretation Date/Time:  Monday April 29 2023 13:50:53 EDT Ventricular Rate:  72 PR Interval:  186 QRS Duration:  74 QT Interval:  362 QTC  Calculation: 396 R Axis:   70  Text Interpretation: Normal sinus rhythm Cannot rule out Anterior infarct , age undetermined Abnormal ECG When compared with ECG of 09-Apr-2023 15:52, QRS axis Shifted right T wave inversion no longer evident in Lateral leads when compard to prior, similar appearance. No STEMI Confirmed by Theda Belfast ([id number]) on 04/29/2023 2:27:42 PM  Radiology DG Chest 2 View  Result Date: 04/29/2023 CLINICAL DATA:  67 year old male with a history of chest pain EXAM: CHEST - 2 VIEW COMPARISON:  12/26/2010 FINDINGS: Cardiomediastinal silhouette unchanged in size and contour. No evidence of central vascular congestion. No interlobular septal thickening. Mild reticular opacities of the bilateral lungs, including some adjacent nodular densities at the left lung base. No pneumothorax or pleural effusion. Coarsened interstitial markings, with no confluent airspace disease. No acute displaced fracture. Degenerative changes of the spine. IMPRESSION: Mild reticulonodular opacities of the lungs, new from the only comparison of 2012. Findings may reflect sequela of atypical infection, or alternatively chronic scarring/atelectasis. Negative for lobar pneumonia. Given the nodular appearance at the left lung base, a follow-up PA and lateral chest X-ray is recommended in 3-4 weeks to assure resolution. Electronically Signed   By: Gilmer Mor D.O.   On: 04/29/2023 16:17    Procedures Procedures    Medications Ordered in ED Medications - No data to display  ED Course/ Medical Decision Making/ A&P Clinical Course as of 04/29/23 1635  Mon Apr 29, 2023  1634 Creatinine(!): 1.45 [JB]    Clinical Course User Index [JB] Rodrickus Min, Horald Chestnut, PA-C                                 Medical Decision Making Amount and/or Complexity of Data Reviewed Labs: ordered. Decision-making details documented in ED Course. Radiology: ordered.  Risk Prescription drug management.   This patient presents to  the ED for concern of back pain, this involves an extensive number of treatment options, and is a complaint that carries with it a high risk of complications and morbidity.  The differential diagnosis includes ACS, pericarditis, MSK pain, GERD    Co morbidities that complicate the patient evaluation  NSTEMi 7/20 HTN HLD   Additional history obtained:  Additional history obtained from chart review     Lab Tests:  I Ordered, and personally interpreted labs.  The pertinent results include:   Cbc WNL Bmp consistent with  recent prior labs  Trop WNL, repeat Trop WNL   Imaging Studies ordered:  I ordered imaging studies including Cxray   I independently visualized and interpreted imaging which showed no acute cardiopulmonary pathology  I agree with the radiologist interpretation   Cardiac Monitoring: / EKG:  The patient was maintained on a cardiac monitor.  I personally viewed and interpreted the cardiac monitored which showed an underlying rhythm of: sinus. Not a normal EKG but similar appearance to recent prior EKG    Consultations Obtained:  I requested consultation with the cardiology,  and discussed lab and imaging findings as well as pertinent plan - they recommend: f/u OP as scheduled and education for pt on red flag CP   Problem List / ED Course / Critical interventions / Medication management  Pt pmhx of NSTEMI requiring stent, HTN and HLD presenting with back pain radiating to the left arm. Pt is worse w/ ROM and palpation.  Given patient PE and lab findings, and after consult w/ cardio, pts pain can be managed w/ good f/u w/ PCP and cardio I ordered medication including Oxy  for pain, but pt drove himself so he declined   Reevaluation of the patient after these medicines showed that the patient stayed the same I have reviewed the patients home medicines and have made adjustments as needed   Plan Discussed w/ patient lab results and findings on PE Discussed  cardio recommendations on when to return to ER D/c for muscle spam - sent pain medication and muscle relaxer to pharmacy and discussed multi modal pain management  Encouraged f/u w/ PCP for back spasm         Final Clinical Impression(s) / ED Diagnoses Final diagnoses:  None    Rx / DC Orders ED Discharge Orders     None         Smitty Knudsen, PA-C 04/29/23 1845    Ernie Avena, MD 04/30/23 [phone]

## 2023-04-29 NOTE — Discharge Instructions (Addendum)
Please follow up with your cardiologist, as discussed.  For back pain - Lidocaine patch, Tylenol, ice, heat, light stretching. I prescribed Robaxin, a muscle relaxer, to help.  Please return if you have worsening sx, worsening chest pain, exertional chest pain, or chest pain relieved by nitroglycerin.

## 2023-04-30 ENCOUNTER — Telehealth: Payer: Self-pay

## 2023-04-30 NOTE — Transitions of Care (Post Inpatient/ED Visit) (Signed)
04/30/2023  Name: Brandon Sanders MRN: [id number] DOB: 1955/09/25  Today's TOC FU Call Status: Today's TOC FU Call Status:: Successful TOC FU Call Completed TOC FU Call Complete Date: 04/30/23  Transition Care Management Follow-up Telephone Call Date of Discharge: 04/29/23 Discharge Facility: [facility] ([facility]) Type of Discharge: Emergency Department Reason for ED Visit: Other: (muscle spasm) How have you been since you were released from the hospital?: Better Any questions or concerns?: No  Items Reviewed: Did you receive and understand the discharge instructions provided?: Yes Medications obtained,verified, and reconciled?: Yes (Medications Reviewed) Any new allergies since your discharge?: No Dietary orders reviewed?: Yes Do you have support at home?: Yes People in Home: spouse  Medications Reviewed Today: Medications Reviewed Today     Reviewed by Karena Addison, LPN (Licensed Practical Nurse) on 04/30/23 at 1326  Med List Status: <None>   Medication Order Taking? Sig Documenting Provider Last Dose Status Informant  ALLERGY RELIEF 10 MG tablet [id number] No TAKE 1 TABLET BY MOUTH DAILY Corwin Levins, MD Taking Active   amLODipine (NORVASC) 5 MG tablet [id number] No TAKE 1 TABLET BY MOUTH DAILY Corwin Levins, MD Taking Active   aspirin 81 MG EC tablet [id number] No Take 81 mg by mouth daily. [provider] Taking Active   atorvastatin (LIPITOR) 80 MG tablet [id number] No Take 1 tablet (80 mg total) by mouth daily. Jodelle Gross, NP Taking Active   clopidogrel (PLAVIX) 75 MG tablet [id number]  START 24 hours after last Brilinta dose. Take 4 tablets (300mg ) by mouth once. Then start taking 1 tablet (75mg ) by mouth daily 24 hours thereafter. Maisie Fus, MD  Active   cyanocobalamin (,VITAMIN B-12,) 1000 MCG/ML injection [id number] No Inject into the muscle. weekly [provider] Taking Active   ezetimibe (ZETIA) 10 MG tablet [id number] No TAKE 1  TABLET BY MOUTH DAILY Maisie Fus, MD Taking Active   fenofibrate 160 MG tablet [id number] No TAKE 1 TABLET BY MOUTH DAILY Corwin Levins, MD Taking Active   fluticasone [facility]) 50 MCG/ACT nasal spray [id number] No USE 2 SPRAYS IN BOTH  NOSTRILS DAILY Corwin Levins, MD Taking Active   methocarbamol (ROBAXIN) 500 MG tablet [id number]  Take 2 tablets (1,000 mg total) by mouth 4 (four) times daily. Renne Crigler, PA-C  Active   metoprolol succinate (TOPROL-XL) 50 MG 24 hr tablet [id number] No Take 3 tablets (150 mg total) by mouth daily. Maisie Fus, MD Taking Active   nitroGLYCERIN (NITROSTAT) 0.4 MG SL tablet [id number]  Place 1 tablet (0.4 mg total) under the tongue every 5 (five) minutes as needed for chest pain. Renne Crigler, PA-C  Active             Home Care and Equipment/Supplies: Were Home Health Services Ordered?: NA Any new equipment or medical supplies ordered?: NA  Functional Questionnaire: Do you need assistance with bathing/showering or dressing?: No Do you need assistance with meal preparation?: No Do you need assistance with eating?: No Do you have difficulty maintaining continence: No Do you need assistance with getting out of bed/getting out of a chair/moving?: No Do you have difficulty managing or taking your medications?: No  Follow up appointments reviewed: PCP Follow-up appointment confirmed?: No (declined) Specialist Hospital Follow-up appointment confirmed?: Yes Date of Specialist follow-up appointment?: 05/16/23 Follow-Up Specialty Provider:: cardio Do you need transportation to your follow-up appointment?: No Do you understand care options if your condition(s) worsen?: Yes-patient verbalized understanding    SIGNATURE Karena Addison, LPN  [facility] Nurse Health Advisor Direct Dial [phone]

## 2023-05-01 ENCOUNTER — Encounter: Payer: Self-pay | Admitting: Internal Medicine

## 2023-05-02 ENCOUNTER — Ambulatory Visit: Payer: 59 | Admitting: Internal Medicine

## 2023-05-02 ENCOUNTER — Encounter: Payer: Self-pay | Admitting: Internal Medicine

## 2023-05-02 VITALS — BP 120/80 | HR 65 | Temp 97.8°F | Ht 74.0 in | Wt 176.0 lb

## 2023-05-02 DIAGNOSIS — I10 Essential (primary) hypertension: Secondary | ICD-10-CM | POA: Diagnosis not present

## 2023-05-02 DIAGNOSIS — R972 Elevated prostate specific antigen [PSA]: Secondary | ICD-10-CM | POA: Diagnosis not present

## 2023-05-02 DIAGNOSIS — M5410 Radiculopathy, site unspecified: Secondary | ICD-10-CM

## 2023-05-02 DIAGNOSIS — N1831 Chronic kidney disease, stage 3a: Secondary | ICD-10-CM

## 2023-05-02 DIAGNOSIS — R739 Hyperglycemia, unspecified: Secondary | ICD-10-CM

## 2023-05-02 DIAGNOSIS — R7989 Other specified abnormal findings of blood chemistry: Secondary | ICD-10-CM

## 2023-05-02 NOTE — Progress Notes (Signed)
Patient ID: Brandon Sanders, male   DOB: [birth year], 67 y.o.   MRN: [id number]        Chief Complaint: follow up left neck pain with radiating pain       HPI:  Brandon Sanders is a 67 y.o. male here to f/u after ED visit with left neck upper back , shoulder and arm pain to the elbow, tx for muscle spasm with muscle relaxer but no help.  Pain is positional, lying down makes better.  Had somewhat similar right sided pain here at last visit I supsect was radiculitis.  Has f/u exam with neurology tomorrow   Pt denies chest pain, increased sob or doe, wheezing, orthopnea, PND, increased LE swelling, palpitations, dizziness or syncope.   Pt denies polydipsia, polyuria, or new focal neuro s/s.    Pt denies fever, wt loss, night sweats, loss of appetite, or other constitutional symptoms         Wt Readings from Last 3 Encounters:  05/02/23 176 lb (79.8 kg)  04/29/23 175 lb (79.4 kg)  04/24/23 175 lb 12.8 oz (79.7 kg)   BP Readings from Last 3 Encounters:  05/02/23 120/80  04/29/23 125/83  04/24/23 104/68         Past Medical History:  Diagnosis Date   Carpal tunnel syndrome, bilateral    Chronic renal insufficiency    COLITIS 12/17/2007   DJD of shoulder    Erectile dysfunction    managed with injections; no response with Viagra/Cialis   HTN (hypertension)    Hyperlipidemia    PLANTAR FASCIITIS 09/05/2010   Renal cell cancer (HCC)    currently in clinical trial using Afinitor   Solitary kidney, acquired    SYNCOPE 09/05/2010   Past Surgical History:  Procedure Laterality Date   NEPHRECTOMY     Right   s.p right CTS release     s/p right ulnar nerve release     VASECTOMY      reports that he has never smoked. He has never used smokeless tobacco. He reports that he does not drink alcohol and does not use drugs. family history includes Heart disease in an other family member; Hypertension (age of onset: 88) in his father; Liver cancer in his maternal grandmother; Stroke in his father;  Transient ischemic attack in his father. No Known Allergies Current Outpatient Medications on File Prior to Visit  Medication Sig Dispense Refill   amLODipine (NORVASC) 5 MG tablet TAKE 1 TABLET BY MOUTH DAILY 30 tablet 11   aspirin 81 MG EC tablet Take 81 mg by mouth daily.     atorvastatin (LIPITOR) 80 MG tablet Take 1 tablet (80 mg total) by mouth daily. 90 tablet 3   clopidogrel (PLAVIX) 75 MG tablet START 24 hours after last Brilinta dose. Take 4 tablets (300mg ) by mouth once. Then start taking 1 tablet (75mg ) by mouth daily 24 hours thereafter. 94 tablet 0   cyanocobalamin (,VITAMIN B-12,) 1000 MCG/ML injection Inject into the muscle. weekly     ezetimibe (ZETIA) 10 MG tablet TAKE 1 TABLET BY MOUTH DAILY 90 tablet 3   fenofibrate 160 MG tablet TAKE 1 TABLET BY MOUTH DAILY 30 tablet 11   fluticasone (FLONASE) 50 MCG/ACT nasal spray USE 2 SPRAYS IN BOTH  NOSTRILS DAILY 48 g 2   methocarbamol (ROBAXIN) 500 MG tablet Take 2 tablets (1,000 mg total) by mouth 4 (four) times daily. 30 tablet 0   metoprolol succinate (TOPROL-XL) 50 MG 24 hr tablet Take 3  tablets (150 mg total) by mouth daily. 270 tablet 3   nitroGLYCERIN (NITROSTAT) 0.4 MG SL tablet Place 1 tablet (0.4 mg total) under the tongue every 5 (five) minutes as needed for chest pain. 30 tablet 0   No current facility-administered medications on file prior to visit.        ROS:  All others reviewed and negative.  Objective        PE:  BP 120/80 (BP Location: Right Arm, Patient Position: Sitting, Cuff Size: Normal)   Pulse 65   Temp 97.8 F (36.6 C) (Oral)   Ht 6\' 2"  (1.88 m)   Wt 176 lb (79.8 kg)   SpO2 99%   BMI 22.60 kg/m                 Constitutional: Pt appears in NAD               HENT: Head: NCAT.                Right Ear: External ear normal.                 Left Ear: External ear normal.                Eyes: . Pupils are equal, round, and reactive to light. Conjunctivae and EOM are normal               Nose:  without d/c or deformity               Neck: Neck supple. Gross normal ROM               Cardiovascular: Normal rate and regular rhythm.                 Pulmonary/Chest: Effort normal and breath sounds without rales or wheezing.                Abd:  Soft, NT, ND, + BS, no organomegaly               Neurological: Pt is alert. At baseline orientation, motor grossly intact               Skin: Skin is warm. No rashes, no other new lesions, LE edema - none               Psychiatric: Pt behavior is normal without agitation   Micro: none  Cardiac tracings I have personally interpreted today:  none  Pertinent Radiological findings (summarize): none   Lab Results  Component Value Date   WBC 6.8 04/29/2023   HGB 15.0 04/29/2023   HCT 44.6 04/29/2023   PLT 304 04/29/2023   GLUCOSE 125 (H) 04/29/2023   CHOL 113 04/22/2023   TRIG 88.0 04/22/2023   HDL 39.60 04/22/2023   LDLDIRECT 90.2 10/27/2009   LDLCALC 56 04/22/2023   ALT 27 04/17/2023   AST 30 04/17/2023   NA 137 04/29/2023   K 4.2 04/29/2023   CL 100 04/29/2023   CREATININE 1.45 (H) 04/29/2023   BUN 22 04/29/2023   CO2 27 04/29/2023   TSH 2.41 04/22/2023   PSA 3.30 04/17/2023   INR 0.92 02/16/2011   HGBA1C 6.1 04/17/2023   MICROALBUR 1.1 04/05/2016   Assessment/Plan:  Brandon Sanders is a 67 y.o. White or Caucasian [1] male with  has a past medical history of Carpal tunnel syndrome, bilateral, Chronic renal insufficiency, COLITIS (12/17/2007), DJD of shoulder, Erectile dysfunction, HTN (hypertension), Hyperlipidemia,  PLANTAR FASCIITIS (09/05/2010), Renal cell cancer (HCC), Solitary kidney, acquired, and SYNCOPE (09/05/2010).  Radiculitis Pt appeared to have right sided pain at last visit, now with left, c/w neuritic pain.  Declines gabapentin trial for now or further imaging, incidentally has neurology appt tomorrow.    Hyperglycemia Lab Results  Component Value Date   HGBA1C 6.1 04/17/2023   Stable, pt to continue current  medical treatment  - diet, wt control   HTN (hypertension) BP Readings from Last 3 Encounters:  05/02/23 120/80  04/29/23 125/83  04/24/23 104/68   Stable, pt to continue medical treatment norvac 5 every day,toprol xl 50 qd   CKD (chronic kidney disease) stage 3, GFR 30-59 ml/min (HCC) Lab Results  Component Value Date   CREATININE 1.45 (H) 04/29/2023   Stable overall, cont to avoid nephrotoxins   Low vitamin B12 level Lab Results  Component Value Date   VITAMINB12 949 (H) 04/17/2023   Stable, cont oral replacement - b12 1000 mcg qd   Increased prostate specific antigen (PSA) velocity D/w pt - will need f/u testing next visit  Lab Results  Component Value Date   PSA 3.30 04/17/2023   PSA 1.15 06/12/2021   PSA 2.01 02/16/2021     Followup: Return in about 6 months (around 11/02/2023).  Oliver Barre, MD 05/04/2023 2:59 PM [facility]

## 2023-05-04 ENCOUNTER — Encounter: Payer: Self-pay | Admitting: Internal Medicine

## 2023-05-04 DIAGNOSIS — M5410 Radiculopathy, site unspecified: Secondary | ICD-10-CM | POA: Insufficient documentation

## 2023-05-04 NOTE — Assessment & Plan Note (Signed)
Lab Results  Component Value Date   CREATININE 1.45 (H) 04/29/2023   Stable overall, cont to avoid nephrotoxins

## 2023-05-04 NOTE — Assessment & Plan Note (Addendum)
D/w pt - will need f/u testing next visit  Lab Results  Component Value Date   PSA 3.30 04/17/2023   PSA 1.15 06/12/2021   PSA 2.01 02/16/2021

## 2023-05-04 NOTE — Assessment & Plan Note (Signed)
Lab Results  Component Value Date   VITAMINB12 949 (H) 04/17/2023   Stable, cont oral replacement - b12 1000 mcg qd

## 2023-05-04 NOTE — Patient Instructions (Signed)
Please continue all other medications as before, and refills have been done if requested.  Please have the pharmacy call with any other refills you may need.  Please continue your efforts at being more active, low cholesterol diet, and weight control.  You are otherwise up to date with prevention measures today.  Please keep your appointments with your specialists as you may have planned - neurology tomorrow  Please make an Appointment to return in 6 months, or sooner if needed, also with Lab Appointment for testing done 3-5 days before at the FIRST FLOOR Lab (so this is for TWO appointments - please see the scheduling desk as you leave)

## 2023-05-04 NOTE — Assessment & Plan Note (Signed)
 Lab Results  Component Value Date   HGBA1C 6.1 04/17/2023   Stable, pt to continue current medical treatment  - diet, wt control

## 2023-05-04 NOTE — Assessment & Plan Note (Signed)
Pt appeared to have right sided pain at last visit, now with left, c/w neuritic pain.  Declines gabapentin trial for now or further imaging, incidentally has neurology appt tomorrow.

## 2023-05-04 NOTE — Assessment & Plan Note (Signed)
BP Readings from Last 3 Encounters:  05/02/23 120/80  04/29/23 125/83  04/24/23 104/68   Stable, pt to continue medical treatment norvac 5 every day,toprol xl 50 qd

## 2023-05-06 ENCOUNTER — Other Ambulatory Visit: Payer: Self-pay | Admitting: *Deleted

## 2023-05-06 MED ORDER — CLOPIDOGREL BISULFATE 75 MG PO TABS: ORAL_TABLET | ORAL | 3 refills | Status: DC

## 2023-05-16 ENCOUNTER — Ambulatory Visit: Payer: 59 | Attending: Internal Medicine | Admitting: Internal Medicine

## 2023-05-16 VITALS — BP 112/72 | HR 59 | Ht 74.0 in | Wt 182.0 lb

## 2023-05-16 DIAGNOSIS — I214 Non-ST elevation (NSTEMI) myocardial infarction: Secondary | ICD-10-CM | POA: Diagnosis not present

## 2023-05-16 NOTE — Patient Instructions (Signed)
Medication Instructions:  Your physician recommends that you continue on your current medications as directed. Please refer to the Current Medication list given to you today.  *If you need a refill on your cardiac medications before your next appointment, please call your pharmacy*  Follow-Up: At [facility], you and your health needs are our priority.  As part of our continuing mission to provide you with exceptional heart care, we have created designated Provider Care Teams.  These Care Teams include your primary Cardiologist (physician) and Advanced Practice Providers (APPs -  Physician Assistants and Nurse Practitioners) who all work together to provide you with the care you need, when you need it.    Your next appointment:   6 month(s)  Provider:   Maisie Fus, MD

## 2023-05-16 NOTE — Progress Notes (Addendum)
Cardiology Office Note:    Date:  05/16/2023   ID:  SADIE ZANDER, DOB Jan 25, 1956, MRN [id number]  PCP:  Corwin Levins, MD   [facility] Providers Cardiologist:  Maisie Fus, MD     Referring MD: Corwin Levins, MD   No chief complaint on file.  High CAC  History of Present Illness:    CLEARANCE CHRISTE is a 67 y.o. male with a hx of HTN, CKD stage III, RCC, HLD CAC 2052, predominant RCA lesion  His father has had heart disease at 70 years old and passed of heart failure. He had bypass surgery. He had a stroke.  He has had high cholesterol. He started taking metoprolol for prevention purposes. He has two PCPs. One with [facility]. He is exhausted. He was offered the CAC scoring. He has not felt good in hears. He denies chest pain , no exertional dyspnea. He averages 5,000 steps a day. Her blood pressure is fairly well controlled 132/70 mmHg. He denies LH, dizziness, or syncope. No orthopnea, PND, no LE edema.   He has history of RCC on afinitor. He stopped it as to not wanting to be immunosuppressed. He took it for 8 years.  He was seen by Dr. Eden Emms in 01/2011 for palpitations. He had a holter that showed PAC, and PVCs that were benign. He was started on metoprolol and noted dosing up to 100 mg XL. He had a myovue and echo that January that was normal.   Family Hx : mother had diabetes. Father had heart disease per above.  LDL 101 mg/dL TC 308 mg/dL  CVD Risk/Equivalent: HLD- yes HTN- yes PAD- No DMII- No Smoker-No Stroke-No Premature Family History- Yes  Interim hx: He notes frequent palpitations. He is worried something is wrong. It makes him feel anxious. He feels fatigued. He notes about 6 hours of sleep. He denies syncope. He denies LH  Interim Hx 05/25/2022 His zio showed mild PVC burden, I increased his metoprolol to 150 mg XL. His PVCs have improved. He is taking 125 mg. No DOE or chest pressure  Interim Hx 05/16/2023 He saw Joni Reining in July. Prior to  this,  He had been experiencing shoulder pain that radiated into his chest for about a week prior to the trip. The pain, initially located in the shoulder, would move into the chest and return one or two times during the day. While in [place], the frequency of the pain increased and became so severe that he could not find a comfortable position to sleep. He sought medical attention after two nights of discomfort. He was in [place] at the time and presented to [facility]. He underwent Cardiac catheterization was performed on 04/01/2023. This showed a mid Lcx lesion to OM 95% lesion as the culprit vessel. Started on DAPT. He had an echo in [place] which he states was normal. He was initially on brilinta but had significant SOB. This was changed to plavix. Since then he went to the ED with neck and arm pain. Troponins were negative and EKG did not show acute changes. Possible anterior infarct   Past Medical History:  Diagnosis Date   Carpal tunnel syndrome, bilateral    Chronic renal insufficiency    COLITIS 12/17/2007   DJD of shoulder    Erectile dysfunction    managed with injections; no response with Viagra/Cialis   HTN (hypertension)    Hyperlipidemia    PLANTAR FASCIITIS 09/05/2010  Renal cell cancer (HCC)    currently in clinical trial using Afinitor   Solitary kidney, acquired    SYNCOPE 09/05/2010    Past Surgical History:  Procedure Laterality Date   NEPHRECTOMY     Right   s.p right CTS release     s/p right ulnar nerve release     VASECTOMY      Current Medications: Current Outpatient Medications on File Prior to Visit  Medication Sig Dispense Refill   amLODipine (NORVASC) 5 MG tablet TAKE 1 TABLET BY MOUTH DAILY 30 tablet 11   aspirin 81 MG EC tablet Take 81 mg by mouth daily.     atorvastatin (LIPITOR) 80 MG tablet Take 1 tablet (80 mg total) by mouth daily. 90 tablet 3   clopidogrel (PLAVIX) 75 MG tablet START 24 hours after last Brilinta dose.  Take 4 tablets (300mg ) by mouth once. Then start taking 1 tablet (75mg ) by mouth daily 24 hours thereafter. 90 tablet 3   cyanocobalamin (,VITAMIN B-12,) 1000 MCG/ML injection Inject into the muscle. weekly     ezetimibe (ZETIA) 10 MG tablet TAKE 1 TABLET BY MOUTH DAILY 90 tablet 3   fenofibrate 160 MG tablet TAKE 1 TABLET BY MOUTH DAILY 30 tablet 11   fluticasone (FLONASE) 50 MCG/ACT nasal spray USE 2 SPRAYS IN BOTH  NOSTRILS DAILY 48 g 2   gabapentin (NEURONTIN) 300 MG capsule Take 300 mg by mouth 2 (two) times daily.     HYDROcodone-acetaminophen (NORCO) 7.5-325 MG tablet Take 1-2 tablets by mouth daily as needed.     methocarbamol (ROBAXIN) 500 MG tablet Take 2 tablets (1,000 mg total) by mouth 4 (four) times daily. 30 tablet 0   metoprolol succinate (TOPROL-XL) 50 MG 24 hr tablet Take 3 tablets (150 mg total) by mouth daily. 270 tablet 3   nitroGLYCERIN (NITROSTAT) 0.4 MG SL tablet Place 1 tablet (0.4 mg total) under the tongue every 5 (five) minutes as needed for chest pain. 30 tablet 0   No current facility-administered medications on file prior to visit.     Allergies:   Patient has no known allergies.   Social History   Socioeconomic History   Marital status: Married    Spouse name: Not on file   Number of children: 3   Years of education: Not on file   Highest education level: Not on file  Occupational History   Occupation: Investment banker, corporate: [employer]  Tobacco Use   Smoking status: Never   Smokeless tobacco: Never   Tobacco comments:    quit 25 yrs ago  Substance and Sexual Activity   Alcohol use: No   Drug use: No   Sexual activity: Yes  Other Topics Concern   Not on file  Social History Narrative   Lives with wife   Social Determinants of Health   Financial Resource Strain: Not on file  Food Insecurity: Not on file  Transportation Needs: Not on file  Physical Activity: Not on file  Stress: Not on file  Social Connections: Unknown (01/21/2022)    Received [facility]   Social Network    Social Network: Not on file     Family History: The patient's family history includes Heart disease in an other family member; Hypertension (age of onset: 8) in his father; Liver cancer in his maternal grandmother; Stroke in his father; Transient ischemic attack in his father.  ROS:   Please see the history of present illness.  All other systems reviewed and are negative.  EKGs/Labs/Other Studies Reviewed:    The following studies were reviewed today:   EKG:  EKG is  ordered today.  The ekg ordered today demonstrates   11/2/222-NSR, Bigeminy PVCs  02/22/2022- NSR   Cardiac Studies  07/24/2021 FINDINGS: Non-cardiac: See separate report from [facility]. Ascending Aorta: Normal caliber.  Aortic atherosclerosis. Aortic Valve: Aortic valve calcium score 419. Pericardium: Normal. Coronary arteries: Normal origins. Coronary Calcium Score: Left main: 36 Left anterior descending artery: 616 Left circumflex artery: 303 Right coronary artery: 1097 Total: 2052  08/10/2021 Exercise Spect- METS 9, 87% MPHR. Low risk nuclear study  08/22/2021 TTE- normal EF, normal strain, no valve disease, no pulmonary htn  08/10/2021 Normal SPECT  [facility] [place], images in the EHR Left main patent bifurcating into the LAD and nondominant circumflex.  Circumflex was nondominant vessel with diffuse calcified atherosclerosis within the proximal and mid segment.  The mid circumflex leading into the large caliber old distal OM had 95% stenosis which was believed to be the culprit vessel.  There was also 50% stenosis more proximally.   LAD is a transapical vessel with diffuse luminal irregularities.  There was a 40% stenosis within the midportion of the vessel at the takeoff of a moderate caliber Raniya Golembeski.   Right coronary artery was large dominant vessel with diffuse calcified atherosclerosis with focal 40% to 50% stenosis  in its mid segment.  There were several focal 67% stenosis distally just before the crux.  Beyond the crux there was a moderate caliber posterior lateral manage and PDA both of which have luminal irregularities. Intervention: A 2.5 x 26 mm Resolute stent to the mid circumflex leading into the marginal Arav Bannister was deployed.   Recent Labs: 04/17/2023: ALT 27 04/22/2023: TSH 2.41 04/29/2023: BUN 22; Creatinine, Ser 1.45; Hemoglobin 15.0; Platelets 304; Potassium 4.2; Sodium 137   Recent Lipid Panel    Component Value Date/Time   CHOL 113 04/22/2023 0739   CHOL 141 06/12/2022 0814   TRIG 88.0 04/22/2023 0739   HDL 39.60 04/22/2023 0739   HDL 46 06/12/2022 0814   CHOLHDL 3 04/22/2023 0739   VLDL 17.6 04/22/2023 0739   LDLCALC 56 04/22/2023 0739   LDLCALC 77 06/12/2022 0814   LDLDIRECT 90.2 10/27/2009 0722     Risk Assessment/Calculations:          Physical Exam:    VS:   Vitals:   05/16/23 1135  BP: 112/72  Pulse: (!) 59  SpO2: 98%     Wt Readings from Last 3 Encounters:  05/02/23 176 lb (79.8 kg)  04/29/23 175 lb (79.4 kg)  04/24/23 175 lb 12.8 oz (79.7 kg)     GEN:  Well nourished, well developed in no acute distress HEENT: moist mucous membranes LYMPHATICS: No lymphadenopathy CARDIAC: RRR, no murmurs, rubs, gallops RESPIRATORY:  Clear to auscultation without rales, wheezing or rhonchi  ABDOMEN: Soft, non-tender, non-distended MUSCULOSKELETAL:  No edema; No deformity  SKIN: Warm and dry NEUROLOGIC:  Alert and oriented x 3 PSYCHIATRIC:  Normal affect   ASSESSMENT:   #CAD:Had elevated CAC and frequent PVCs. He underwent exercise spect with 9.8 mets; this was normal back in December 2022.  Now s/p NSTEMI Lcx-OM s/p DES.   -currently symptoms are MSK, don't know if his initial presentation was an anginal equivalent. We discussed that if he has similar symptoms to his presentation in [place] and they are relieved with nitroglycerin, than that would be a reason to call for  help - LDL 56 mg/dL at goal  #PVCs: he had noted PACs and PVCs in the past. He is anxious about his rhythm. He had mild PVCs. Increased his metop. Continue metop XL 125 mg daily  #PreOp: if he needs surgery for his neck, he is acceptable cardiac risk  #Cardio-Oncology: Now off everolimus. No known high risk for cardiotoxicity  CKD Stage III/RCC: crt 1.45  #HTN: well controlled  PLAN:    In order of problems listed above:  Continue DAPT, lipitor 80 mg daily, continue zetia 10 mg daily, continue BB. Continue nitro SL  PRN.  Plavix stop date is 03/31/2024 Cardiac referral rehab Follow up in 6 months     Signed, Maisie Fus, MD  05/16/2023 10:28 AM    [facility]

## 2023-05-17 ENCOUNTER — Telehealth: Payer: Self-pay | Admitting: Internal Medicine

## 2023-05-17 ENCOUNTER — Telehealth (HOSPITAL_COMMUNITY): Payer: Self-pay

## 2023-05-17 MED ORDER — CLOPIDOGREL BISULFATE 75 MG PO TABS: ORAL_TABLET | ORAL | 3 refills | Status: DC

## 2023-05-17 NOTE — Telephone Encounter (Signed)
Returned patients call. Patients Plavix was sent to [facility] but he uses [facility] mail service. This nurse sent to order to the correct pharmacy. Pt has 5 pills left. Advised pt to call back if he runs out before the meds come. Pt verbalized understanding.

## 2023-05-17 NOTE — Telephone Encounter (Signed)
Called and spoke with pt in regards to CR, pt stated he is not able to participate at this time due to his work schedule.   Closed referral 

## 2023-05-17 NOTE — Telephone Encounter (Signed)
Pt c/o medication issue:  1. Name of Medication:   clopidogrel (PLAVIX) 75 MG tablet    2. How are you currently taking this medication (dosage and times per day)?   START 24 hours after last Brilinta dose. Take 4 tablets (300mg ) by mouth once. Then start taking 1 tablet (75mg ) by mouth daily 24 hours thereafter.    3. Are you having a reaction (difficulty breathing--STAT)? No   4. What is your medication issue? Pt calling stating that the pharmacy stated they did not get the order for the medication even though it shows it has been confirmed by the pharmacy on our end. Please advise

## 2023-05-17 NOTE — Telephone Encounter (Signed)
Pt insurance is active and benefits verified through [facility]. Co-pay $25.00, DED $0.00/$0.00 met, out of pocket $5,000.00/$5,000.00 met, co-insurance 0%. No pre-authorization required. [employer], 05/17/23 @ 2:25PM, [id number]   How many CR sessions are covered? (36 visits for TCR, 72 visits for ICR)24 visits Is this a lifetime maximum or an annual maximum? Lifetime Has the member used any of these services to date? No Is there a time limit (weeks/months) on start of program and/or program completion? No     Will contact patient to see if he is interested in the Cardiac Rehab Program.

## 2023-05-18 ENCOUNTER — Other Ambulatory Visit: Payer: 59

## 2023-06-01 ENCOUNTER — Other Ambulatory Visit: Payer: Self-pay | Admitting: Internal Medicine

## 2023-06-12 ENCOUNTER — Encounter: Payer: Self-pay | Admitting: Internal Medicine

## 2023-06-19 ENCOUNTER — Telehealth: Payer: Self-pay | Admitting: *Deleted

## 2023-06-19 NOTE — Telephone Encounter (Signed)
Pre-operative Risk Assessment    Patient Name: Brandon Sanders  DOB: 1956-05-24 MRN: [id number]    DATE OF LAST VISIT: 05/16/23 DR. MARY BRANCH DATE OF NEXT VISIT: 11/11/23 DR. BRANCH   Request for Surgical Clearance    Procedure:   CERVICAL EPIDURAL INJECTION  Date of Surgery:  Clearance 07/10/23                                 Surgeon:  DR. Park Liter Surgeon's Group or Practice Name:  [facility] Phone number:  [phone] Fax number:  [phone]   Type of Clearance Requested:   - Medical  - Pharmacy:  Hold Clopidogrel (Plavix) x 7 DAYS PRIOR   Type of Anesthesia:  Not Indicated   Additional requests/questions:    [name]   06/19/2023, 1:14 PM

## 2023-06-20 NOTE — Telephone Encounter (Signed)
Name: Brandon Sanders  DOB: [birth year]  MRN: [id number]   Primary Cardiologist: Maisie Fus, MD  Chart reviewed as part of pre-operative protocol coverage. Patient was contacted 06/20/2023 in reference to pre-operative risk assessment for pending surgery as outlined below.  JERMARIUS KOSA was last seen on 05/16/2023 by Dr. Wyline Mood.  Since that day, ARO BARANOWSKI has done well from a cardiac standpoint.  He denies any new symptoms or concerns.  He is able to complete greater than 4 METS without difficulty.  Therefore, based on ACC/AHA guidelines, the patient would be at acceptable risk for the planned procedure without further cardiovascular testing.   The patient was advised that if he develops new symptoms prior to surgery to contact our office to arrange for a follow-up visit, and he verbalized understanding.  Please note, patient is post PCI/DES in 03/2023.  He is to continue uninterrupted DAPT for at least 6 months (through the end of January 2025), preferably 1 year.  Therefore, he is unable to hold Plavix prior to this procedure. Pt is aware of this recommendation.   I will route this recommendation to the requesting party via Epic fax function and remove from pre-op pool. Please call with questions.  Joylene Grapes, NP 06/20/2023, 8:36 AM

## 2023-07-08 ENCOUNTER — Encounter: Payer: Self-pay | Admitting: Internal Medicine

## 2023-07-08 DIAGNOSIS — Z1211 Encounter for screening for malignant neoplasm of colon: Secondary | ICD-10-CM

## 2023-07-22 LAB — COLOGUARD: COLOGUARD: NEGATIVE

## 2023-09-03 ENCOUNTER — Ambulatory Visit (INDEPENDENT_AMBULATORY_CARE_PROVIDER_SITE_OTHER): Payer: 59 | Admitting: Internal Medicine

## 2023-09-03 ENCOUNTER — Encounter: Payer: Self-pay | Admitting: Internal Medicine

## 2023-09-03 VITALS — BP 130/68 | HR 70 | Temp 98.9°F | Ht 74.0 in | Wt 178.0 lb

## 2023-09-03 DIAGNOSIS — D72829 Elevated white blood cell count, unspecified: Secondary | ICD-10-CM | POA: Insufficient documentation

## 2023-09-03 DIAGNOSIS — R972 Elevated prostate specific antigen [PSA]: Secondary | ICD-10-CM

## 2023-09-03 DIAGNOSIS — R739 Hyperglycemia, unspecified: Secondary | ICD-10-CM | POA: Diagnosis not present

## 2023-09-03 DIAGNOSIS — R911 Solitary pulmonary nodule: Secondary | ICD-10-CM | POA: Insufficient documentation

## 2023-09-03 LAB — URINALYSIS, ROUTINE W REFLEX MICROSCOPIC
Bilirubin Urine: NEGATIVE
Hgb urine dipstick: NEGATIVE
Ketones, ur: NEGATIVE
Leukocytes,Ua: NEGATIVE
Nitrite: NEGATIVE
RBC / HPF: NONE SEEN (ref 0–?)
Specific Gravity, Urine: 1.02 (ref 1.000–1.030)
Total Protein, Urine: NEGATIVE
Urine Glucose: NEGATIVE
Urobilinogen, UA: 0.2 (ref 0.0–1.0)
WBC, UA: NONE SEEN (ref 0–?)
pH: 7 (ref 5.0–8.0)

## 2023-09-03 LAB — PSA: PSA: 3.04 ng/mL (ref 0.10–4.00)

## 2023-09-03 LAB — CBC WITH DIFFERENTIAL/PLATELET
Basophils Absolute: 0.1 10*3/uL (ref 0.0–0.1)
Basophils Relative: 0.9 % (ref 0.0–3.0)
Eosinophils Absolute: 0.1 10*3/uL (ref 0.0–0.7)
Eosinophils Relative: 1.8 % (ref 0.0–5.0)
HCT: 46 % (ref 39.0–52.0)
Hemoglobin: 15.1 g/dL (ref 13.0–17.0)
Lymphocytes Relative: 20.2 % (ref 12.0–46.0)
Lymphs Abs: 1.7 10*3/uL (ref 0.7–4.0)
MCHC: 32.9 g/dL (ref 30.0–36.0)
MCV: 90 fL (ref 78.0–100.0)
Monocytes Absolute: 0.8 10*3/uL (ref 0.1–1.0)
Monocytes Relative: 9.8 % (ref 3.0–12.0)
Neutro Abs: 5.6 10*3/uL (ref 1.4–7.7)
Neutrophils Relative %: 67.3 % (ref 43.0–77.0)
Platelets: 382 10*3/uL (ref 150.0–400.0)
RBC: 5.11 Mil/uL (ref 4.22–5.81)
RDW: 13.3 % (ref 11.5–15.5)
WBC: 8.3 10*3/uL (ref 4.0–10.5)

## 2023-09-03 LAB — BASIC METABOLIC PANEL
BUN: 23 mg/dL (ref 6–23)
CO2: 30 meq/L (ref 19–32)
Calcium: 10 mg/dL (ref 8.4–10.5)
Chloride: 101 meq/L (ref 96–112)
Creatinine, Ser: 1.25 mg/dL (ref 0.40–1.50)
GFR: 60.22 mL/min (ref >60.00)
Glucose, Bld: 89 mg/dL (ref 70–99)
Potassium: 4.3 meq/L (ref 3.5–5.1)
Sodium: 139 meq/L (ref 135–145)

## 2023-09-03 LAB — HEPATIC FUNCTION PANEL
ALT: 18 U/L (ref 0–53)
AST: 20 U/L (ref 0–37)
Albumin: 4.1 g/dL (ref 3.5–5.2)
Alkaline Phosphatase: 77 U/L (ref 39–117)
Bilirubin, Direct: 0.1 mg/dL (ref 0.0–0.3)
Total Bilirubin: 0.6 mg/dL (ref 0.2–1.2)
Total Protein: 6.7 g/dL (ref 6.0–8.3)

## 2023-09-03 LAB — HEMOGLOBIN A1C: Hgb A1c MFr Bld: 6.5 % (ref 4.6–6.5)

## 2023-09-03 NOTE — Assessment & Plan Note (Signed)
Also for CT chest no CM

## 2023-09-03 NOTE — Patient Instructions (Signed)
Please continue all other medications as before, and refills have been done if requested.  Please have the pharmacy call with any other refills you may need.  Please continue your efforts at being more active, low cholesterol diet, and weight control.  Please keep your appointments with your specialists as you may have planned  You will be contacted regarding the referral for: CT chest (no Contrast)  Please go to the LAB at the blood drawing area for the tests to be done - only the tests ordered today  You will be contacted by phone if any changes need to be made immediately.  Otherwise, you will receive a letter about your results with an explanation, but please check with MyChart first.

## 2023-09-03 NOTE — Progress Notes (Signed)
Patient ID: Brandon Sanders, male   DOB: 06-19-1956, 67 y.o.   MRN: [id number]        Chief Complaint: follow up recent elevated WBC 14.3 - Aug 29 2023       HPI:  Brandon Sanders is a 67 y.o. male here with above, now s/p recent steroid injeciton to bilateral SI joints on Dec 11, with recent unusual elevated WBC 14,3 but denies fever, chills, HA, ST, cough, Pt denies chest pain, increased sob or doe, wheezing, orthopnea, PND, increased LE swelling, palpitations, dizziness or syncope.  Denies urinary symptoms such as dysuria, frequency, urgency, flank pain, hematuria or n/v, fever, chills.  Denies worsening reflux, abd pain, dysphagia, n/v, bowel change or blood.   Pt denies wt loss, night sweats, loss of appetite, or other constitutional symptoms. Did have abnormal CXR 04-29-2023 with ? Nodularity left lower lung and suggestion of f/u cxr.  Did also have MRI abd w wo CM in [facility] system Aug 28 2023  - negative for recurrence of renal Ca or metastasis.         Wt Readings from Last 3 Encounters:  09/03/23 178 lb (80.7 kg)  05/16/23 182 lb (82.6 kg)  05/02/23 176 lb (79.8 kg)   BP Readings from Last 3 Encounters:  09/03/23 130/68  05/16/23 112/72  05/02/23 120/80         Past Medical History:  Diagnosis Date   Carpal tunnel syndrome, bilateral    Chronic renal insufficiency    COLITIS 12/17/2007   DJD of shoulder    Erectile dysfunction    managed with injections; no response with Viagra/Cialis   HTN (hypertension)    Hyperlipidemia    PLANTAR FASCIITIS 09/05/2010   Renal cell cancer (HCC)    currently in clinical trial using Afinitor   Solitary kidney, acquired    SYNCOPE 09/05/2010   Past Surgical History:  Procedure Laterality Date   NEPHRECTOMY     Right   s.p right CTS release     s/p right ulnar nerve release     VASECTOMY      reports that he has never smoked. He has never used smokeless tobacco. He reports that he does not drink alcohol and does not use drugs. family history  includes Heart disease in an other family member; Hypertension (age of onset: 48) in his father; Liver cancer in his maternal grandmother; Stroke in his father; Transient ischemic attack in his father. No Known Allergies Current Outpatient Medications on File Prior to Visit  Medication Sig Dispense Refill   amLODipine (NORVASC) 5 MG tablet TAKE 1 TABLET BY MOUTH DAILY 30 tablet 11   aspirin 81 MG EC tablet Take 81 mg by mouth daily.     clopidogrel (PLAVIX) 75 MG tablet START 24 hours after last Brilinta dose. Take 4 tablets (300mg ) by mouth once. Then start taking 1 tablet (75mg ) by mouth daily 24 hours thereafter. 90 tablet 3   cyanocobalamin (,VITAMIN B-12,) 1000 MCG/ML injection Inject into the muscle. weekly     ezetimibe (ZETIA) 10 MG tablet TAKE 1 TABLET BY MOUTH DAILY 90 tablet 3   fluticasone (FLONASE) 50 MCG/ACT nasal spray USE 2 SPRAYS IN BOTH  NOSTRILS DAILY 48 g 2   metoprolol succinate (TOPROL-XL) 50 MG 24 hr tablet TAKE 3 TABLETS BY MOUTH DAILY 270 tablet 3   nitroGLYCERIN (NITROSTAT) 0.4 MG SL tablet Place 1 tablet (0.4 mg total) under the tongue every 5 (five) minutes as needed for chest pain.  30 tablet 0   atorvastatin (LIPITOR) 80 MG tablet Take 1 tablet (80 mg total) by mouth daily. 90 tablet 3   No current facility-administered medications on file prior to visit.        ROS:  All others reviewed and negative.  Objective        PE:  BP 130/68 (BP Location: Right Arm, Patient Position: Sitting, Cuff Size: Normal)   Pulse 70   Temp 98.9 F (37.2 C) (Oral)   Ht 6\' 2"  (1.88 m)   Wt 178 lb (80.7 kg)   SpO2 99%   BMI 22.85 kg/m                 Constitutional: Pt appears in NAD, not ill appearing               HENT: Head: NCAT.                Right Ear: External ear normal.                 Left Ear: External ear normal.                Eyes: . Pupils are equal, round, and reactive to light. Conjunctivae and EOM are normal               Nose: without d/c or deformity                Neck: Neck supple. Gross normal ROM               Cardiovascular: Normal rate and regular rhythm.                 Pulmonary/Chest: Effort normal and breath sounds without rales or wheezing.                Abd:  Soft, NT, ND, + BS, no organomegaly               Neurological: Pt is alert. At baseline orientation, motor grossly intact               Skin: Skin is warm. No rashes, no other new lesions, LE edema - none               Psychiatric: Pt behavior is normal without agitation   Micro: none  Cardiac tracings I have personally interpreted today:  none  Pertinent Radiological findings (summarize): micrograms 19 2024  CXR MPRESSION: Mild reticulonodular opacities of the lungs, new from the only comparison of 2012. Findings may reflect sequela of atypical infection, or alternatively chronic scarring/atelectasis. Negative for lobar pneumonia.   Given the nodular appearance at the left lung base, a follow-up PA and lateral chest X-ray is recommended in 3-4 weeks to assure resolution.     Lab Results  Component Value Date   WBC 6.8 04/29/2023   HGB 15.0 04/29/2023   HCT 44.6 04/29/2023   PLT 304 04/29/2023   GLUCOSE 125 (H) 04/29/2023   CHOL 113 04/22/2023   TRIG 88.0 04/22/2023   HDL 39.60 04/22/2023   LDLDIRECT 90.2 10/27/2009   LDLCALC 56 04/22/2023   ALT 27 04/17/2023   AST 30 04/17/2023   NA 137 04/29/2023   K 4.2 04/29/2023   CL 100 04/29/2023   CREATININE 1.45 (H) 04/29/2023   BUN 22 04/29/2023   CO2 27 04/29/2023   TSH 2.41 04/22/2023   PSA 3.30 04/17/2023   INR 0.92 02/16/2011   HGBA1C 6.1  04/17/2023   MICROALBUR 1.1 04/05/2016   Assessment/Plan:  Brandon Sanders is a 67 y.o. White or Caucasian [1] male with  has a past medical history of Carpal tunnel syndrome, bilateral, Chronic renal insufficiency, COLITIS (12/17/2007), DJD of shoulder, Erectile dysfunction, HTN (hypertension), Hyperlipidemia, PLANTAR FASCIITIS (09/05/2010), Renal cell cancer (HCC),  Solitary kidney, acquired, and SYNCOPE (09/05/2010).  Hyperglycemia Lab Results  Component Value Date   HGBA1C 6.1 04/17/2023   Stable, pt to continue current medical treatment - diet, wt control   Increased prostate specific antigen (PSA) velocity Lab Results  Component Value Date   PSA 3.30 04/17/2023   PSA 1.15 06/12/2021   PSA 2.01 02/16/2021   For f/u psa today  Nodule of lower lobe of left lung Also for CT chest no CM  Leukocytosis Pt not ill appearing or toxic, has no significant new symptoms, exam benign, now for repeat UA and cbc with labs  Followup: Return if symptoms worsen or fail to improve.  Oliver Barre, MD 09/03/2023 10:42 AM [facility]

## 2023-09-03 NOTE — Assessment & Plan Note (Signed)
 Lab Results  Component Value Date   HGBA1C 6.1 04/17/2023   Stable, pt to continue current medical treatment  - diet, wt control

## 2023-09-03 NOTE — Assessment & Plan Note (Signed)
Lab Results  Component Value Date   PSA 3.30 04/17/2023   PSA 1.15 06/12/2021   PSA 2.01 02/16/2021   For f/u psa today

## 2023-09-03 NOTE — Assessment & Plan Note (Signed)
Pt not ill appearing or toxic, has no significant new symptoms, exam benign, now for repeat UA and cbc with labs

## 2023-09-04 LAB — URINE CULTURE: Result:: NO GROWTH

## 2023-09-04 NOTE — Progress Notes (Signed)
The test results show that your current treatment is OK, as the tests are stable.  Please continue the same plan.  There is no other need for change of treatment or further evaluation based on these results, at this time.  thanks 

## 2023-09-09 ENCOUNTER — Ambulatory Visit
Admission: RE | Admit: 2023-09-09 | Discharge: 2023-09-09 | Disposition: A | Discharge status: Home / Self Care | Payer: 59 | Source: Ambulatory Visit | Attending: Internal Medicine | Admitting: Internal Medicine

## 2023-09-09 DIAGNOSIS — R911 Solitary pulmonary nodule: Secondary | ICD-10-CM

## 2023-09-16 ENCOUNTER — Telehealth: Payer: Self-pay | Admitting: Internal Medicine

## 2023-09-16 NOTE — Telephone Encounter (Signed)
 Patient would like to switch Dr. Please advise

## 2023-09-22 ENCOUNTER — Other Ambulatory Visit: Payer: Self-pay | Admitting: Internal Medicine

## 2023-10-18 ENCOUNTER — Ambulatory Visit: Payer: Self-pay | Admitting: Internal Medicine

## 2023-10-18 ENCOUNTER — Encounter: Payer: Self-pay | Admitting: Family Medicine

## 2023-10-18 ENCOUNTER — Telehealth (INDEPENDENT_AMBULATORY_CARE_PROVIDER_SITE_OTHER): Payer: 59 | Admitting: Family Medicine

## 2023-10-18 DIAGNOSIS — R52 Pain, unspecified: Secondary | ICD-10-CM | POA: Diagnosis not present

## 2023-10-18 DIAGNOSIS — R509 Fever, unspecified: Secondary | ICD-10-CM

## 2023-10-18 DIAGNOSIS — R0981 Nasal congestion: Secondary | ICD-10-CM | POA: Diagnosis not present

## 2023-10-18 DIAGNOSIS — J3489 Other specified disorders of nose and nasal sinuses: Secondary | ICD-10-CM | POA: Diagnosis not present

## 2023-10-18 NOTE — Telephone Encounter (Signed)
 Copied from CRM [id number]. Topic: Clinical - Red Word Triage >> Oct 18, 2023  8:10 AM Russell PARAS wrote: Red Word that prompted transfer to Nurse Triage: Symptoms started on Wednesday evening. Currently has fever, Tylenol  brings down to 99 to 100, but highest 102 Congestion and body aches. Cough, dry No shortness of breath or chest pain.  When sitting up his respiratory rate is 96 and then 98 when standing. Currently heart rate is 96.   Chief Complaint: dry cough Symptoms: intermittent fever, body aches, congestion, headache Frequency: ongoing since Wednesday Pertinent Negatives: Patient denies breathing difficulty Disposition: [] ED /[] Urgent Care (no appt availability in office) / [x] Appointment(In office/virtual)/ []  [place]/ [] Home Care/ [] Refused Recommended Disposition /[] [facility]/ []  Follow-up with PCP Additional Notes:  The patient reported a dry cough that is worse at night and he is unable to sleep, congestion, body aches, headache, a fever that comes and goes, an intermittent runny nose.  His symptoms have been ongoing since Wednesday but he felt worse last night.  He has a history of CAD.  He requested a virtual visit.  He was scheduled for a same day virtual visit for further evaluation.   Reason for Disposition  SEVERE coughing spells (e.g., whooping sound after coughing, vomiting after coughing)  Answer Assessment - Initial Assessment Questions 1. ONSET: When did the cough begin?      Wednesday evening; yesterday evening worse 2. SEVERITY: How bad is the cough today?      Worse in the evening  3. SPUTUM: Describe the color of your sputum (none, dry cough; clear, white, yellow, green)     Dry  4. HEMOPTYSIS: Are you coughing up any blood? If so ask: How much? (flecks, streaks, tablespoons, etc.)     None  5. DIFFICULTY BREATHING: Are you having difficulty breathing? If Yes, ask: How bad is it? (e.g., mild, moderate, severe)    - MILD:  No SOB at rest, mild SOB with walking, speaks normally in sentences, can lie down, no retractions, pulse < 100.    - MODERATE: SOB at rest, SOB with minimal exertion and prefers to sit, cannot lie down flat, speaks in phrases, mild retractions, audible wheezing, pulse 100-120.    - SEVERE: Very SOB at rest, speaks in single words, struggling to breathe, sitting hunched forward, retractions, pulse > 120      None  6. FEVER: Do you have a fever? If Yes, ask: What is your temperature, how was it measured, and when did it start?     With Tylenol  99 7. CARDIAC HISTORY: Do you have any history of heart disease? (e.g., heart attack, congestive heart failure)      CAD  8. LUNG HISTORY: Do you have any history of lung disease?  (e.g., pulmonary embolus, asthma, emphysema)     None  10. OTHER SYMPTOMS: Do you have any other symptoms? (e.g., runny nose, wheezing, chest pain)       Body aches, runny nose, congestion, sneezing  Protocols used: Cough - Acute Non-Productive-A-AH

## 2023-10-18 NOTE — Progress Notes (Signed)
 MyChart Video Visit    Virtual Visit via Video Note    Patient location: Home. Patient and provider in visit Provider location: Office Video visit   I discussed the limitations of evaluation and management by telemedicine and the availability of in person appointments. The patient expressed understanding and agreed to proceed. 2 patient identifiers used.   Visit Date: 10/18/2023  Today's healthcare provider: Boby Mackintosh, NP-C     Subjective:    Patient ID: Brandon Sanders, male    DOB: February 21, 1956, 68 y.o.   MRN: [id number]  Chief Complaint  Patient presents with   Cough   Nasal Congestion    Cough Associated symptoms include chills, a fever and myalgias. Pertinent negatives include no chest pain, ear pain, headaches, sore throat, shortness of breath or wheezing.    C/o a 2 day hx of fever, chills, body aches, nasal congestion, rhinorrhea, and cough.   Denies dizziness, ear pain, chest pain, palpitations, shortness of breath, N/V/D.   Taking OTC cold and flu medication.    Past Medical History:  Diagnosis Date   Carpal tunnel syndrome, bilateral    Chronic renal insufficiency    COLITIS 12/17/2007   DJD of shoulder    Erectile dysfunction    managed with injections; no response with Viagra/Cialis    HTN (hypertension)    Hyperlipidemia    PLANTAR FASCIITIS 09/05/2010   Renal cell cancer (HCC)    currently in clinical trial using Afinitor   Solitary kidney, acquired    SYNCOPE 09/05/2010    Past Surgical History:  Procedure Laterality Date   NEPHRECTOMY     Right   s.p right CTS release     s/p right ulnar nerve release     VASECTOMY      Family History  Problem Relation Age of Onset   Stroke Father    Transient ischemic attack Father    Hypertension Father 34       CABG   Liver cancer Maternal Grandmother    Heart disease Other     Social History   Socioeconomic History   Marital status: Married    Spouse name: Not on file   Number of  children: 3   Years of education: Not on file   Highest education level: Associate degree: academic program  Occupational History   Occupation: Investment Banker, Corporate: [employer]  Tobacco Use   Smoking status: Never   Smokeless tobacco: Never   Tobacco comments:    quit 25 yrs ago  Substance and Sexual Activity   Alcohol use: No   Drug use: No   Sexual activity: Yes  Other Topics Concern   Not on file  Social History Narrative   Lives with wife   Social Drivers of Health   Financial Resource Strain: Low Risk  (09/02/2023)   Overall Financial Resource Strain (CARDIA)    Difficulty of Paying Living Expenses: Not hard at all  Food Insecurity: No Food Insecurity (09/02/2023)   Hunger Vital Sign    Worried About Running Out of Food in the Last Year: Never true    Ran Out of Food in the Last Year: Never true  Transportation Needs: No Transportation Needs (09/02/2023)   PRAPARE - Administrator, Civil Service (Medical): No    Lack of Transportation (Non-Medical): No  Physical Activity: Sufficiently Active (09/02/2023)   Exercise Vital Sign    Days of Exercise per Week: 5 days    Minutes  of Exercise per Session: 30 min  Stress: No Stress Concern Present (09/02/2023)   [employer] of Occupational Health - Occupational Stress Questionnaire    Feeling of Stress : Not at all  Social Connections: Socially Integrated (09/02/2023)   Social Connection and Isolation Panel [NHANES]    Frequency of Communication with Friends and Family: More than three times a week    Frequency of Social Gatherings with Friends and Family: Twice a week    Attends Religious Services: More than 4 times per year    Active Member of [employer] or Organizations: Yes    Attends Engineer, Structural: More than 4 times per year    Marital Status: Married  Catering Manager Violence: Unknown (12/13/2021)   Received from [employer], [facility]   HITS    Physically Hurt: Not on file     Insult or Talk Down To: Not on file    Threaten Physical Harm: Not on file    Scream or Curse: Not on file    Outpatient Medications Prior to Visit  Medication Sig Dispense Refill   amLODipine  (NORVASC ) 5 MG tablet TAKE 1 TABLET BY MOUTH DAILY 30 tablet 11   aspirin 81 MG EC tablet Take 81 mg by mouth daily.     atorvastatin  (LIPITOR) 80 MG tablet Take 1 tablet (80 mg total) by mouth daily. 90 tablet 3   clopidogrel  (PLAVIX ) 75 MG tablet START 24 hours after last Brilinta dose. Take 4 tablets (300mg ) by mouth once. Then start taking 1 tablet (75mg ) by mouth daily 24 hours thereafter. 90 tablet 3   cyanocobalamin  (,VITAMIN B-12,) 1000 MCG/ML injection Inject into the muscle. weekly     ezetimibe  (ZETIA ) 10 MG tablet TAKE 1 TABLET BY MOUTH DAILY 90 tablet 3   fluticasone  (FLONASE ) 50 MCG/ACT nasal spray USE 2 SPRAYS IN BOTH  NOSTRILS DAILY 48 g 2   metoprolol  succinate (TOPROL -XL) 50 MG 24 hr tablet TAKE 3 TABLETS BY MOUTH DAILY 270 tablet 3   nitroGLYCERIN  (NITROSTAT ) 0.4 MG SL tablet Place 1 tablet (0.4 mg total) under the tongue every 5 (five) minutes as needed for chest pain. 30 tablet 0   No facility-administered medications prior to visit.    No Known Allergies  Review of Systems  Constitutional:  Positive for chills, fever and malaise/fatigue.  HENT:  Positive for congestion. Negative for ear pain and sore throat.   Respiratory:  Positive for cough. Negative for shortness of breath and wheezing.   Cardiovascular:  Negative for chest pain, palpitations and leg swelling.  Gastrointestinal:  Negative for abdominal pain, constipation, diarrhea, nausea and vomiting.  Musculoskeletal:  Positive for myalgias.  Neurological:  Negative for dizziness, tingling, weakness and headaches.       Objective:    Physical Exam Constitutional:      General: He is not in acute distress.    Appearance: He is not ill-appearing.  HENT:     Nose: Congestion present.     Mouth/Throat:      Pharynx: Oropharynx is clear.  Eyes:     Extraocular Movements: Extraocular movements intact.     Conjunctiva/sclera: Conjunctivae normal.  Pulmonary:     Effort: Pulmonary effort is normal.  Musculoskeletal:        General: Normal range of motion.     Cervical back: Normal range of motion.  Skin:    General: Skin is warm and dry.  Neurological:     General: No focal deficit present.  Mental Status: He is alert and oriented to person, place, and time.  Psychiatric:        Mood and Affect: Mood normal.        Behavior: Behavior normal.        Thought Content: Thought content normal.     There were no vitals taken for this visit. Wt Readings from Last 3 Encounters:  09/03/23 178 lb (80.7 kg)  05/16/23 182 lb (82.6 kg)  05/02/23 176 lb (79.8 kg)       Assessment & Plan:   Problem List Items Addressed This Visit   None Visit Diagnoses       Fever and chills    -  Primary     Nasal congestion with rhinorrhea         Body aches          No acute distress.  Counseling on symptomatic management.  Illness approaching 48 hours.  Offered for him to come to the office for COVID and flu testing.  He declines for now. We discussed complications relating to viral illnesses and when to seek medical care.  Answered all of his questions.  I am having Debby RONAL Pereyra maintain his aspirin EC, fluticasone , cyanocobalamin , amLODipine , atorvastatin , nitroGLYCERIN , clopidogrel , metoprolol  succinate, and ezetimibe .  No orders of the defined types were placed in this encounter.   I discussed the assessment and treatment plan with the patient. The patient was provided an opportunity to ask questions and all were answered. The patient agreed with the plan and demonstrated an understanding of the instructions.   The patient was advised to call back or seek an in-person evaluation if the symptoms worsen or if the condition fails to improve as anticipated.     Boby Mackintosh, NP-C [facility] [place] [phone] (phone) [phone] (fax)  [facility]

## 2023-10-21 ENCOUNTER — Other Ambulatory Visit: Payer: Self-pay | Admitting: Internal Medicine

## 2023-10-21 DIAGNOSIS — E7800 Pure hypercholesterolemia, unspecified: Secondary | ICD-10-CM

## 2023-10-21 DIAGNOSIS — Z125 Encounter for screening for malignant neoplasm of prostate: Secondary | ICD-10-CM

## 2023-10-21 DIAGNOSIS — R7989 Other specified abnormal findings of blood chemistry: Secondary | ICD-10-CM

## 2023-10-21 DIAGNOSIS — R739 Hyperglycemia, unspecified: Secondary | ICD-10-CM

## 2023-10-21 DIAGNOSIS — E559 Vitamin D deficiency, unspecified: Secondary | ICD-10-CM

## 2023-10-30 ENCOUNTER — Other Ambulatory Visit: Payer: Self-pay | Admitting: Internal Medicine

## 2023-10-30 ENCOUNTER — Other Ambulatory Visit: Payer: Self-pay

## 2023-10-30 MED ORDER — FENOFIBRATE 160 MG PO TABS
160.0000 mg | ORAL_TABLET | Freq: Every day | ORAL | 0 refills | Status: DC
Start: 2023-10-30 — End: 2023-12-25

## 2023-10-30 NOTE — Telephone Encounter (Signed)
Copied from CRM [id number]. Topic: Clinical - Medication Refill >> Oct 30, 2023  8:26 AM Isabell A wrote: Most Recent Primary Care Visit:  Provider: Avanell Shackleton  Department: [facility]  Visit Type: MYCHART VIDEO VISIT  Date: 10/18/2023  Medication: fenofibrate 160 MG tablet  Has the patient contacted their pharmacy? Yes (Agent: If no, request that the patient contact the pharmacy for the refill. If patient does not wish to contact the pharmacy document the reason why and proceed with request.) (Agent: If yes, when and what did the pharmacy advise?)  Is this the correct pharmacy for this prescription? Yes If no, delete pharmacy and type the correct one.  This is the patient's preferred pharmacy:  [facility] [place] [place] - [address] [place]  [address] Phone: [phone] Fax: [phone]   Has the prescription been filled recently? Yes  Is the patient out of the medication? Yes  Has the patient been seen for an appointment in the last year OR does the patient have an upcoming appointment? Yes  Can we respond through MyChart? Yes  Agent: Please be advised that Rx refills may take up to 3 business days. We ask that you follow-up with your pharmacy.

## 2023-11-03 ENCOUNTER — Other Ambulatory Visit: Payer: Self-pay | Admitting: Internal Medicine

## 2023-11-04 ENCOUNTER — Other Ambulatory Visit: Payer: Self-pay

## 2023-11-11 ENCOUNTER — Ambulatory Visit: Payer: 59 | Admitting: Internal Medicine

## 2023-11-22 ENCOUNTER — Ambulatory Visit

## 2023-11-22 ENCOUNTER — Ambulatory Visit: Admitting: Internal Medicine

## 2023-11-22 ENCOUNTER — Encounter: Payer: Self-pay | Admitting: Internal Medicine

## 2023-11-22 VITALS — BP 120/76 | HR 94 | Temp 99.5°F | Ht 74.0 in | Wt 163.0 lb

## 2023-11-22 DIAGNOSIS — M25521 Pain in right elbow: Secondary | ICD-10-CM

## 2023-11-22 DIAGNOSIS — R739 Hyperglycemia, unspecified: Secondary | ICD-10-CM

## 2023-11-22 DIAGNOSIS — Z125 Encounter for screening for malignant neoplasm of prostate: Secondary | ICD-10-CM | POA: Diagnosis not present

## 2023-11-22 DIAGNOSIS — R55 Syncope and collapse: Secondary | ICD-10-CM

## 2023-11-22 DIAGNOSIS — M7918 Myalgia, other site: Secondary | ICD-10-CM

## 2023-11-22 DIAGNOSIS — E559 Vitamin D deficiency, unspecified: Secondary | ICD-10-CM | POA: Diagnosis not present

## 2023-11-22 DIAGNOSIS — R197 Diarrhea, unspecified: Secondary | ICD-10-CM

## 2023-11-22 DIAGNOSIS — E7800 Pure hypercholesterolemia, unspecified: Secondary | ICD-10-CM

## 2023-11-22 DIAGNOSIS — R112 Nausea with vomiting, unspecified: Secondary | ICD-10-CM

## 2023-11-22 DIAGNOSIS — R7989 Other specified abnormal findings of blood chemistry: Secondary | ICD-10-CM | POA: Diagnosis not present

## 2023-11-22 LAB — MICROALBUMIN / CREATININE URINE RATIO
Creatinine,U: 209.7 mg/dL
Microalb Creat Ratio: 22.7 mg/g (ref 0.0–30.0)
Microalb, Ur: 4.8 mg/dL — ABNORMAL HIGH (ref 0.0–1.9)

## 2023-11-22 LAB — CBC WITH DIFFERENTIAL/PLATELET
Basophils Absolute: 0 10*3/uL (ref 0.0–0.1)
Basophils Relative: 0.7 % (ref 0.0–3.0)
Eosinophils Absolute: 0.1 10*3/uL (ref 0.0–0.7)
Eosinophils Relative: 1.3 % (ref 0.0–5.0)
HCT: 46.2 % (ref 39.0–52.0)
Hemoglobin: 15.4 g/dL (ref 13.0–17.0)
Lymphocytes Relative: 21.1 % (ref 12.0–46.0)
Lymphs Abs: 1.1 10*3/uL (ref 0.7–4.0)
MCHC: 33.4 g/dL (ref 30.0–36.0)
MCV: 90 fl (ref 78.0–100.0)
Monocytes Absolute: 0.9 10*3/uL (ref 0.1–1.0)
Monocytes Relative: 17.8 % — ABNORMAL HIGH (ref 3.0–12.0)
Neutro Abs: 3 10*3/uL (ref 1.4–7.7)
Neutrophils Relative %: 59.1 % (ref 43.0–77.0)
Platelets: 212 10*3/uL (ref 150.0–400.0)
RBC: 5.13 Mil/uL (ref 4.22–5.81)
RDW: 14.3 % (ref 11.5–15.5)
WBC: 5 10*3/uL (ref 4.0–10.5)

## 2023-11-22 LAB — BASIC METABOLIC PANEL
BUN: 19 mg/dL (ref 6–23)
CO2: 28 meq/L (ref 19–32)
Calcium: 9.9 mg/dL (ref 8.4–10.5)
Chloride: 96 meq/L (ref 96–112)
Creatinine, Ser: 1.56 mg/dL — ABNORMAL HIGH (ref 0.40–1.50)
GFR: 46.09 mL/min — ABNORMAL LOW (ref >60.00)
Glucose, Bld: 86 mg/dL (ref 70–99)
Potassium: 4.2 meq/L (ref 3.5–5.1)
Sodium: 136 meq/L (ref 135–145)

## 2023-11-22 LAB — URINALYSIS, ROUTINE W REFLEX MICROSCOPIC
Bilirubin Urine: NEGATIVE
Hgb urine dipstick: NEGATIVE
Ketones, ur: NEGATIVE
Leukocytes,Ua: NEGATIVE
Nitrite: NEGATIVE
Specific Gravity, Urine: 1.025 (ref 1.000–1.030)
Total Protein, Urine: NEGATIVE
Urine Glucose: NEGATIVE
Urobilinogen, UA: 0.2 (ref 0.0–1.0)
pH: 6 (ref 5.0–8.0)

## 2023-11-22 LAB — HEPATIC FUNCTION PANEL
ALT: 21 U/L (ref 0–53)
AST: 30 U/L (ref 0–37)
Albumin: 4.3 g/dL (ref 3.5–5.2)
Alkaline Phosphatase: 76 U/L (ref 39–117)
Bilirubin, Direct: 0.1 mg/dL (ref 0.0–0.3)
Total Bilirubin: 0.4 mg/dL (ref 0.2–1.2)
Total Protein: 6.8 g/dL (ref 6.0–8.3)

## 2023-11-22 LAB — LIPID PANEL
Cholesterol: 94 mg/dL (ref 0–200)
HDL: 36.6 mg/dL — ABNORMAL LOW (ref >39.00)
LDL Cholesterol: 39 mg/dL (ref 0–99)
NonHDL: 57.61
Total CHOL/HDL Ratio: 3
Triglycerides: 93 mg/dL (ref 0.0–149.0)
VLDL: 18.6 mg/dL (ref 0.0–40.0)

## 2023-11-22 LAB — VITAMIN B12: Vitamin B-12: 267 pg/mL (ref 211–911)

## 2023-11-22 LAB — VITAMIN D 25 HYDROXY (VIT D DEFICIENCY, FRACTURES): VITD: 43.99 ng/mL (ref 30.00–100.00)

## 2023-11-22 LAB — TSH: TSH: 3.45 u[IU]/mL (ref 0.35–5.50)

## 2023-11-22 LAB — HEMOGLOBIN A1C: Hgb A1c MFr Bld: 5.7 % (ref 4.6–6.5)

## 2023-11-22 LAB — PSA: PSA: 2.78 ng/mL (ref 0.10–4.00)

## 2023-11-22 LAB — TESTOSTERONE: Testosterone: 340.3 ng/dL (ref 300.00–890.00)

## 2023-11-22 NOTE — Assessment & Plan Note (Signed)
 Minor, exam benign likely related to fall, pt declines plain film

## 2023-11-22 NOTE — Assessment & Plan Note (Signed)
 Lab Results  Component Value Date   HGBA1C 6.5 09/03/2023   Stable, pt to continue current medical treatment  - diet, wt control

## 2023-11-22 NOTE — Assessment & Plan Note (Signed)
 Lab Results  Component Value Date   LDLCALC 56 04/22/2023   Stable, pt to continue current statin lipitor 80 qd

## 2023-11-22 NOTE — Assessment & Plan Note (Signed)
Lab Results  Component Value Date   VITAMINB12 949 (H) 04/17/2023   Stable, cont oral replacement - b12 1000 mcg qd

## 2023-11-22 NOTE — Patient Instructions (Signed)
 Your EKG was done today  Please drink more fluids and avoid social situations for the next few days  Please continue all other medications as before, and refills have been done if requested.  Please have the pharmacy call with any other refills you may need.  Please keep your appointments with your specialists as you may have planned  Please go to the XRAY Department in the first floor for the x-ray testing  Please go to the LAB at the blood drawing area for the tests to be done  You will be contacted by phone if any changes need to be made immediately.  Otherwise, you will receive a letter about your results with an explanation, but please check with MyChart first.

## 2023-11-22 NOTE — Assessment & Plan Note (Addendum)
 C/w likely viral illness, starting to improved, exam benign, for labs with cbc , delcines need for zofran or lomotil at this itme, for increased fluids, rest

## 2023-11-22 NOTE — Assessment & Plan Note (Signed)
 I suspect possible buttock intramuscular hematoma, pt declines plain film or mri at this time,  to f/u any worsening symptoms or concerns

## 2023-11-22 NOTE — Assessment & Plan Note (Addendum)
 By hx most likely low volume related, pt for increased fluids, ECG reviewed today, also for labs as ordered including cbc bmp, declines other eval for now such as cardiac monitor, but for cxr today

## 2023-11-22 NOTE — Progress Notes (Signed)
 The test results show that your current treatment is OK, as the tests are stable.  Please continue the same plan.  There is no other need for change of treatment or further evaluation based on these results, at this time.  thanks

## 2023-11-22 NOTE — Progress Notes (Signed)
 Patient ID: Brandon Sanders, male   DOB: [birth year], 68 y.o.   MRN: [id number]        Chief Complaint: follow up 4 days onset GI symptoms and syncope       HPI:  Brandon Sanders is a 68 y.o. male here with c/o 4 days onset n/v and diarrhea watery multiple episodes, feeling ill but overall better starting yesterday.  Unfortunately was in the kitchen yesterday working on the dishes at the sink when without warning he found himself coming to on the floor.  Admits to lower po intake and diarrhea prior as above.  Does not recall the fall itself or events just prior.  Today has marked tender sore area at the right ischium buttock area , and minor soreness of the right elbow, without bruising, swelling or skin tears.  Pt is on plavix - no overt bleeding, no HA today.  No sick contacts he is aware. Pt denies chest pain, increased sob or doe, wheezing, orthopnea, PND, increased LE swelling, palpitations, dizziness or syncope.   Pt denies polydipsia, polyuria  no hx of seizure or arrythmia.       Wt Readings from Last 3 Encounters:  11/22/23 163 lb (73.9 kg)  09/03/23 178 lb (80.7 kg)  05/16/23 182 lb (82.6 kg)   BP Readings from Last 3 Encounters:  11/22/23 120/76  09/03/23 130/68  05/16/23 112/72         Past Medical History:  Diagnosis Date   Carpal tunnel syndrome, bilateral    Chronic renal insufficiency    COLITIS 12/17/2007   DJD of shoulder    Erectile dysfunction    managed with injections; no response with Viagra/Cialis   HTN (hypertension)    Hyperlipidemia    PLANTAR FASCIITIS 09/05/2010   Renal cell cancer (HCC)    currently in clinical trial using Afinitor   Solitary kidney, acquired    SYNCOPE 09/05/2010   Past Surgical History:  Procedure Laterality Date   NEPHRECTOMY     Right   s.p right CTS release     s/p right ulnar nerve release     VASECTOMY      reports that he has never smoked. He has never used smokeless tobacco. He reports that he does not drink alcohol and does  not use drugs. family history includes Heart disease in an other family member; Hypertension (age of onset: 53) in his father; Liver cancer in his maternal grandmother; Stroke in his father; Transient ischemic attack in his father. No Known Allergies Current Outpatient Medications on File Prior to Visit  Medication Sig Dispense Refill   amLODipine (NORVASC) 5 MG tablet TAKE 1 TABLET BY MOUTH DAILY 90 tablet 3   aspirin 81 MG EC tablet Take 81 mg by mouth daily.     clopidogrel (PLAVIX) 75 MG tablet START 24 hours after last Brilinta dose. Take 4 tablets (300mg ) by mouth once. Then start taking 1 tablet (75mg ) by mouth daily 24 hours thereafter. 90 tablet 3   cyanocobalamin (,VITAMIN B-12,) 1000 MCG/ML injection Inject into the muscle. weekly     ezetimibe (ZETIA) 10 MG tablet TAKE 1 TABLET BY MOUTH DAILY 90 tablet 3   fenofibrate 160 MG tablet Take 1 tablet (160 mg total) by mouth daily. 90 tablet 0   fluticasone (FLONASE) 50 MCG/ACT nasal spray USE 2 SPRAYS IN BOTH  NOSTRILS DAILY 48 g 2   metoprolol succinate (TOPROL-XL) 50 MG 24 hr tablet TAKE 3 TABLETS BY MOUTH DAILY 270  tablet 3   nitroGLYCERIN (NITROSTAT) 0.4 MG SL tablet Place 1 tablet (0.4 mg total) under the tongue every 5 (five) minutes as needed for chest pain. 30 tablet 0   atorvastatin (LIPITOR) 80 MG tablet Take 1 tablet (80 mg total) by mouth daily. 90 tablet 3   No current facility-administered medications on file prior to visit.        ROS:  All others reviewed and negative.  Objective        PE:  BP 120/76 (BP Location: Right Arm, Patient Position: Sitting, Cuff Size: Normal)   Pulse 94   Temp 99.5 F (37.5 C) (Oral)   Ht 6\' 2"  (1.88 m)   Wt 163 lb (73.9 kg)   SpO2 98%   BMI 20.93 kg/m                 Constitutional: Pt appears in NAD but fatigued mild weak, thin for height               HENT: Head: NCAT.                Right Ear: External ear normal.                 Left Ear: External ear normal.                 Eyes: . Pupils are equal, round, and reactive to light. Conjunctivae and EOM are normal               Nose: without d/c or deformity               Neck: Neck supple. Gross normal ROM               Cardiovascular: Normal rate and regular rhythm.                 Pulmonary/Chest: Effort normal and breath sounds without rales or wheezing.                Abd:  Soft, NT, ND, + BS, no organomegaly               Neurological: Pt is alert. At baseline orientation, motor grossly intact               Skin: Skin is warm. No rashes, no other new lesions, LE edema - none               Psychiatric: Pt behavior is normal without agitation   Micro: none  Cardiac tracings I have personally interpreted today:  ECG - NSR, NSSTTW changes  Pertinent Radiological findings (summarize): none   Lab Results  Component Value Date   WBC 8.3 09/03/2023   HGB 15.1 09/03/2023   HCT 46.0 09/03/2023   PLT 382.0 09/03/2023   GLUCOSE 89 09/03/2023   CHOL 113 04/22/2023   TRIG 88.0 04/22/2023   HDL 39.60 04/22/2023   LDLDIRECT 90.2 10/27/2009   LDLCALC 56 04/22/2023   ALT 18 09/03/2023   AST 20 09/03/2023   NA 139 09/03/2023   K 4.3 09/03/2023   CL 101 09/03/2023   CREATININE 1.25 09/03/2023   BUN 23 09/03/2023   CO2 30 09/03/2023   TSH 2.41 04/22/2023   PSA 3.04 09/03/2023   INR 0.92 02/16/2011   HGBA1C 6.5 09/03/2023   MICROALBUR 1.1 04/05/2016   Assessment/Plan:  Brandon Sanders is a 68 y.o. White or Caucasian [1] male with  has a past  medical history of Carpal tunnel syndrome, bilateral, Chronic renal insufficiency, COLITIS (12/17/2007), DJD of shoulder, Erectile dysfunction, HTN (hypertension), Hyperlipidemia, PLANTAR FASCIITIS (09/05/2010), Renal cell cancer (HCC), Solitary kidney, acquired, and SYNCOPE (09/05/2010).  Acute buttock pain I suspect possible buttock intramuscular hematoma, pt declines plain film or mri at this time,  to f/u any worsening symptoms or concerns  Nausea vomiting and  diarrhea C/w likely viral illness, starting to improved, exam benign, for labs with cbc , delcines need for zofran or lomotil at this itme, for increased fluids, rest  Right elbow pain Minor, exam benign likely related to fall, pt declines plain film  SYNCOPE By hx most likely low volume related, pt for increased fluids, ECG reviewed today, also for labs as ordered including cbc bmp, declines other eval for now such as cardiac monitor, but for cxr today  HYPERCHOLESTEROLEMIA Lab Results  Component Value Date   LDLCALC 56 04/22/2023   Stable, pt to continue current statin lipitor 80 qd   Hyperglycemia Lab Results  Component Value Date   HGBA1C 6.5 09/03/2023   Stable, pt to continue current medical treatment  - diet, wt control   Low vitamin B12 level Lab Results  Component Value Date   VITAMINB12 949 (H) 04/17/2023   Stable, cont oral replacement - b12 1000 mcg qd  Followup: Return if symptoms worsen or fail to improve.  Oliver Barre, MD 11/22/2023 1:04 PM [facility] - [facility]

## 2023-11-23 ENCOUNTER — Encounter: Payer: Self-pay | Admitting: Nurse Practitioner

## 2023-11-23 ENCOUNTER — Telehealth: Admitting: Nurse Practitioner

## 2023-11-23 DIAGNOSIS — J069 Acute upper respiratory infection, unspecified: Secondary | ICD-10-CM

## 2023-11-23 MED ORDER — PREDNISONE 20 MG PO TABS: 20.0000 mg | ORAL_TABLET | Freq: Every day | ORAL | 0 refills | Status: DC

## 2023-11-23 NOTE — Progress Notes (Signed)
 Virtual Visit Consent   Brandon Sanders, you are scheduled for a virtual visit with a [facility] provider today. Just as with appointments in the office, your consent must be obtained to participate. Your consent will be active for this visit and any virtual visit you may have with one of our providers in the next 365 days. If you have a MyChart account, a copy of this consent can be sent to you electronically.  As this is a virtual visit, video technology does not allow for your provider to perform a traditional examination. This may limit your provider's ability to fully assess your condition. If your provider identifies any concerns that need to be evaluated in person or the need to arrange testing (such as labs, EKG, etc.), we will make arrangements to do so. Although advances in technology are sophisticated, we cannot ensure that it will always work on either your end or our end. If the connection with a video visit is poor, the visit may have to be switched to a telephone visit. With either a video or telephone visit, we are not always able to ensure that we have a secure connection.  By engaging in this virtual visit, you consent to the provision of healthcare and authorize for your insurance to be billed (if applicable) for the services provided during this visit. Depending on your insurance coverage, you may receive a charge related to this service.  I need to obtain your verbal consent now. Are you willing to proceed with your visit today? [name] has provided verbal consent on 11/23/2023 for a virtual visit (video or telephone). Claiborne Rigg, NP  Date: 11/23/2023 12:57 PM   Virtual Visit via Video Note   I, Claiborne Rigg, connected with  Brandon Sanders  ([id number], 1956-08-04) on 11/23/23 at 12:45 PM EDT by a video-enabled telemedicine application and verified that I am speaking with the correct person using two identifiers.  Location: Patient: Virtual Visit Location Patient:  Home Provider: Virtual Visit Location Provider: Home Office   I discussed the limitations of evaluation and management by telemedicine and the availability of in person appointments. The patient expressed understanding and agreed to proceed.    History of Present Illness: Brandon Sanders is a 68 y.o. who identifies as a male who was assigned male at birth, and is being seen today for viral URI  [name] has been experiencing cough with chest congestion, nasal congestion, weakness and lightheadedness over the past several days. He saw his PCP yesterday and labs were unremarkable. Chest xray results pending currently.     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   Low vitamin B12 level 06/12/2021   Urinary frequency 06/12/2021   High risk medication use 02/22/2021   Renal cell carcinoma (HCC) 02/22/2021   Spinal stenosis of lumbar region 12/04/2020   Lumbar radiculopathy 11/21/2020   Right leg pain 10/09/2020   Low back pain 10/06/2020   Trigeminal neuralgia 06/17/2020   Dyshidrotic eczema 10/28/2019  Peyronie disease 05/21/2019   LLQ pain 06/19/2018   Dysfunction of left eustachian tube 06/19/2018   Bilateral hearing loss 04/30/2018   DDD (degenerative disc disease), cervical 09/19/2017   Cervical radiculopathy 09/19/2017   Rash 04/18/2017   Allergic rhinitis 04/18/2017   Right arm pain 04/16/2017   Left ear hearing loss 04/12/2016   Ventral hernia 04/12/2016   Hyperglycemia 04/12/2016   Chest pain on  breathing 12/20/2015   Cough 10/24/2015   Leg cramps 10/24/2015   Abdominal wall hernia 10/03/2014   Prostate nodule without urinary obstruction 03/04/2014   Acute bronchitis 01/28/2014   Atypical facial pain 07/07/2013   Incisional hernia 06/09/2013   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   CKD (chronic kidney disease) stage 3, GFR 30-59 ml/min (HCC) 12/26/2010   Encounter for well adult exam with abnormal findings 12/22/2010   Erectile dysfunction    Carpal tunnel syndrome, bilateral    Solitary kidney, acquired    DJD of shoulder    PLANTAR FASCIITIS 09/05/2010   SYNCOPE 09/05/2010   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) 10/28/2008   COLITIS 12/17/2007   HTN (hypertension) 10/27/2007   HYPERCHOLESTEROLEMIA 06/24/2007    Allergies: No Known Allergies Medications:  Current Outpatient Medications:    predniSONE (DELTASONE) 20 MG tablet, Take 1 tablet (20 mg total) by mouth daily with breakfast for 5 days., Disp: 5 tablet, Rfl: 0   amLODipine (NORVASC) 5 MG tablet, TAKE 1 TABLET BY MOUTH DAILY, Disp: 90 tablet, Rfl: 3   aspirin 81 MG EC tablet, Take 81 mg by mouth daily., Disp: , Rfl:    atorvastatin (LIPITOR) 80 MG tablet, Take 1 tablet (80 mg total) by mouth daily., Disp: 90 tablet, Rfl: 3   clopidogrel (PLAVIX) 75 MG tablet, START 24 hours after last Brilinta dose. Take 4 tablets (300mg ) by mouth once. Then start taking 1 tablet (75mg ) by mouth daily 24 hours thereafter., Disp: 90 tablet, Rfl: 3   cyanocobalamin (,VITAMIN B-12,) 1000 MCG/ML injection, Inject into the muscle. weekly, Disp: , Rfl:    ezetimibe (ZETIA) 10 MG tablet, TAKE 1 TABLET BY MOUTH DAILY, Disp: 90 tablet, Rfl: 3   fenofibrate 160 MG tablet, Take 1 tablet (160 mg total) by mouth daily., Disp: 90 tablet, Rfl: 0   fluticasone (FLONASE) 50 MCG/ACT nasal spray, USE 2 SPRAYS IN BOTH  NOSTRILS DAILY, Disp: 48 g, Rfl: 2   metoprolol succinate (TOPROL-XL) 50 MG 24 hr tablet,  TAKE 3 TABLETS BY MOUTH DAILY, Disp: 270 tablet, Rfl: 3   nitroGLYCERIN (NITROSTAT) 0.4 MG SL tablet, Place 1 tablet (0.4 mg total) under the tongue every 5 (five) minutes as needed for chest pain., Disp: 30 tablet, Rfl: 0  Observations/Objective: Patient is well-developed, well-nourished in no acute distress.  Resting comfortably at home.  Head is normocephalic, atraumatic.  No labored breathing.  Speech is clear and coherent with logical content.  Patient is alert and oriented at baseline.    Assessment and Plan: 1. URI with cough and congestion (Primary) - predniSONE (DELTASONE) 20 MG tablet; Take 1 tablet (20 mg total) by mouth daily with breakfast for 5 days.  Dispense: 5 tablet; Refill: 0   Follow Up Instructions: I discussed the assessment and treatment plan with the patient. The patient was provided an opportunity to ask questions and all were answered. The patient agreed with the plan and demonstrated an understanding of the instructions.  A copy of instructions were sent to the patient via MyChart  unless otherwise noted below.    The patient was advised to call back or seek an in-person evaluation if the symptoms worsen or if the condition fails to improve as anticipated.    Claiborne Rigg, NP

## 2023-11-23 NOTE — Patient Instructions (Signed)
 Enos Fling, thank you for joining Claiborne Rigg, NP for today's virtual visit.  While this provider is not your primary care provider (PCP), if your PCP is located in our provider database this encounter information will be shared with them immediately following your visit.   A [facility] MyChart account gives you access to today's visit and all your visits, tests, and labs performed at [facility] " click here if you don't have a [facility] MyChart account or go to [email or web address]  Consent: (Patient) Enos Fling provided verbal consent for this virtual visit at the beginning of the encounter.  Current Medications:  Current Outpatient Medications:    predniSONE (DELTASONE) 20 MG tablet, Take 1 tablet (20 mg total) by mouth daily with breakfast for 5 days., Disp: 5 tablet, Rfl: 0   amLODipine (NORVASC) 5 MG tablet, TAKE 1 TABLET BY MOUTH DAILY, Disp: 90 tablet, Rfl: 3   aspirin 81 MG EC tablet, Take 81 mg by mouth daily., Disp: , Rfl:    atorvastatin (LIPITOR) 80 MG tablet, Take 1 tablet (80 mg total) by mouth daily., Disp: 90 tablet, Rfl: 3   clopidogrel (PLAVIX) 75 MG tablet, START 24 hours after last Brilinta dose. Take 4 tablets (300mg ) by mouth once. Then start taking 1 tablet (75mg ) by mouth daily 24 hours thereafter., Disp: 90 tablet, Rfl: 3   cyanocobalamin (,VITAMIN B-12,) 1000 MCG/ML injection, Inject into the muscle. weekly, Disp: , Rfl:    ezetimibe (ZETIA) 10 MG tablet, TAKE 1 TABLET BY MOUTH DAILY, Disp: 90 tablet, Rfl: 3   fenofibrate 160 MG tablet, Take 1 tablet (160 mg total) by mouth daily., Disp: 90 tablet, Rfl: 0   fluticasone (FLONASE) 50 MCG/ACT nasal spray, USE 2 SPRAYS IN BOTH  NOSTRILS DAILY, Disp: 48 g, Rfl: 2   metoprolol succinate (TOPROL-XL) 50 MG 24 hr tablet, TAKE 3 TABLETS BY MOUTH DAILY, Disp: 270 tablet, Rfl: 3   nitroGLYCERIN (NITROSTAT) 0.4 MG SL tablet, Place 1 tablet (0.4 mg total) under the tongue every 5 (five) minutes as  needed for chest pain., Disp: 30 tablet, Rfl: 0   Medications ordered in this encounter:  Meds ordered this encounter  Medications   predniSONE (DELTASONE) 20 MG tablet    Sig: Take 1 tablet (20 mg total) by mouth daily with breakfast for 5 days.    Dispense:  5 tablet    Refill:  0    Supervising Provider:   Merrilee Jansky [[id number]]     *If you need refills on other medications prior to your next appointment, please contact your pharmacy*  Follow-Up: Call back or seek an in-person evaluation if the symptoms worsen or if the condition fails to improve as anticipated.  [facility] [phone]  Other Instructions INSTRUCTIONS: use a humidifier for nasal congestion Drink plenty of fluids, rest and wash hands frequently to avoid the spread of infection Alternate tylenol and Motrin for relief of fever    If you have been instructed to have an in-person evaluation today at a local Urgent Care facility, please use the link below. It will take you to a list of all of our available [facility], including address, phone number and hours of operation. Please do not delay care.  [facility]  If you or a family member do not have a primary care provider, use the link below to schedule a visit and establish care. When you choose a  primary care  physician or advanced practice provider, you gain a long-term partner in health. Find a Primary Care Provider  Learn more about [facility]'s in-office and virtual care options:  - Get Care Now

## 2023-11-26 ENCOUNTER — Ambulatory Visit (INDEPENDENT_AMBULATORY_CARE_PROVIDER_SITE_OTHER): Admitting: Internal Medicine

## 2023-11-26 ENCOUNTER — Encounter: Payer: Self-pay | Admitting: Internal Medicine

## 2023-11-26 VITALS — BP 122/64 | HR 56 | Temp 98.1°F | Ht 74.0 in | Wt 162.0 lb

## 2023-11-26 DIAGNOSIS — J069 Acute upper respiratory infection, unspecified: Secondary | ICD-10-CM | POA: Diagnosis not present

## 2023-11-26 DIAGNOSIS — R7989 Other specified abnormal findings of blood chemistry: Secondary | ICD-10-CM | POA: Diagnosis not present

## 2023-11-26 DIAGNOSIS — J309 Allergic rhinitis, unspecified: Secondary | ICD-10-CM | POA: Diagnosis not present

## 2023-11-26 DIAGNOSIS — I10 Essential (primary) hypertension: Secondary | ICD-10-CM | POA: Diagnosis not present

## 2023-11-26 MED ORDER — DOXYCYCLINE HYCLATE 100 MG PO TABS: 100.0000 mg | ORAL_TABLET | Freq: Two times a day (BID) | ORAL | 0 refills | Status: DC

## 2023-11-26 MED ORDER — PREDNISONE 20 MG PO TABS: 20.0000 mg | ORAL_TABLET | Freq: Every day | ORAL | 0 refills | Status: AC

## 2023-11-26 NOTE — Progress Notes (Unsigned)
 Patient ID: Brandon Sanders, male   DOB: [birth year], 68 y.o.   MRN: [id number]        Chief Complaint: follow up sinus pain, allergies, htn       HPI:  Brandon Sanders is a 68 y.o. male here after recent televisit with sinus inflammation improved with prednisone, but now worse again -  Here with 2-3 days acute onset fever, facial pain, pressure, headache, general weakness and malaise, and greenish d/c, with mild ST and cough, but pt denies chest pain, wheezing, increased sob or doe, orthopnea, PND, increased LE swelling, palpitations, dizziness or syncope.  Does have several wks ongoing nasal allergy symptoms with clearish congestion, itch and sneezing, without fever, pain, ST, cough, swelling or wheezing.  Leaving the country in 5 days, wants to feel better.         Wt Readings from Last 3 Encounters:  11/26/23 162 lb (73.5 kg)  11/22/23 163 lb (73.9 kg)  09/03/23 178 lb (80.7 kg)   BP Readings from Last 3 Encounters:  11/26/23 122/64  11/22/23 120/76  09/03/23 130/68         Past Medical History:  Diagnosis Date   Carpal tunnel syndrome, bilateral    Chronic renal insufficiency    COLITIS 12/17/2007   DJD of shoulder    Erectile dysfunction    managed with injections; no response with Viagra/Cialis   HTN (hypertension)    Hyperlipidemia    PLANTAR FASCIITIS 09/05/2010   Renal cell cancer (HCC)    currently in clinical trial using Afinitor   Solitary kidney, acquired    SYNCOPE 09/05/2010   Past Surgical History:  Procedure Laterality Date   NEPHRECTOMY     Right   s.p right CTS release     s/p right ulnar nerve release     VASECTOMY      reports that he has never smoked. He has never used smokeless tobacco. He reports that he does not drink alcohol and does not use drugs. family history includes Heart disease in an other family member; Hypertension (age of onset: 9) in his father; Liver cancer in his maternal grandmother; Stroke in his father; Transient ischemic attack in his  father. No Known Allergies Current Outpatient Medications on File Prior to Visit  Medication Sig Dispense Refill   amLODipine (NORVASC) 5 MG tablet TAKE 1 TABLET BY MOUTH DAILY 90 tablet 3   aspirin 81 MG EC tablet Take 81 mg by mouth daily.     clopidogrel (PLAVIX) 75 MG tablet START 24 hours after last Brilinta dose. Take 4 tablets (300mg ) by mouth once. Then start taking 1 tablet (75mg ) by mouth daily 24 hours thereafter. 90 tablet 3   cyanocobalamin (,VITAMIN B-12,) 1000 MCG/ML injection Inject into the muscle. weekly     ezetimibe (ZETIA) 10 MG tablet TAKE 1 TABLET BY MOUTH DAILY 90 tablet 3   fenofibrate 160 MG tablet Take 1 tablet (160 mg total) by mouth daily. 90 tablet 0   fluticasone (FLONASE) 50 MCG/ACT nasal spray USE 2 SPRAYS IN BOTH  NOSTRILS DAILY 48 g 2   metoprolol succinate (TOPROL-XL) 50 MG 24 hr tablet TAKE 3 TABLETS BY MOUTH DAILY 270 tablet 3   nitroGLYCERIN (NITROSTAT) 0.4 MG SL tablet Place 1 tablet (0.4 mg total) under the tongue every 5 (five) minutes as needed for chest pain. 30 tablet 0   atorvastatin (LIPITOR) 80 MG tablet Take 1 tablet (80 mg total) by mouth daily. 90 tablet 3  No current facility-administered medications on file prior to visit.        ROS:  All others reviewed and negative.  Objective        PE:  BP 122/64 (BP Location: Right Arm, Patient Position: Sitting, Cuff Size: Normal)   Pulse (!) 56   Temp 98.1 F (36.7 C) (Oral)   Ht 6\' 2"  (1.88 m)   Wt 162 lb (73.5 kg)   SpO2 99%   BMI 20.80 kg/m                 Constitutional: Pt appears in NAD               HENT: Head: NCAT.                Right Ear: External ear normal.                 Left Ear: External ear normal. Bilat tm's with mild erythema.  Max sinus areas mild tender.  Pharynx with mild erythema, no exudate               Eyes: . Pupils are equal, round, and reactive to light. Conjunctivae and EOM are normal               Nose: without d/c or deformity               Neck: Neck  supple. Gross normal ROM               Cardiovascular: Normal rate and regular rhythm.                 Pulmonary/Chest: Effort normal and breath sounds without rales or wheezing.                Abd:  Soft, NT, ND, + BS, no organomegaly               Neurological: Pt is alert. At baseline orientation, motor grossly intact               Skin: Skin is warm. No rashes, no other new lesions, LE edema - none               Psychiatric: Pt behavior is normal without agitation   Micro: none  Cardiac tracings I have personally interpreted today:  none  Pertinent Radiological findings (summarize): none   Lab Results  Component Value Date   WBC 5.0 11/22/2023   HGB 15.4 11/22/2023   HCT 46.2 11/22/2023   PLT 212.0 11/22/2023   GLUCOSE 86 11/22/2023   CHOL 94 11/22/2023   TRIG 93.0 11/22/2023   HDL 36.60 (L) 11/22/2023   LDLDIRECT 90.2 10/27/2009   LDLCALC 39 11/22/2023   ALT 21 11/22/2023   AST 30 11/22/2023   NA 136 11/22/2023   K 4.2 11/22/2023   CL 96 11/22/2023   CREATININE 1.56 (H) 11/22/2023   BUN 19 11/22/2023   CO2 28 11/22/2023   TSH 3.45 11/22/2023   PSA 2.78 11/22/2023   INR 0.92 02/16/2011   HGBA1C 5.7 11/22/2023   MICROALBUR 4.8 (H) 11/22/2023   Assessment/Plan:  Brandon Sanders is a 68 y.o. White or Caucasian [1] male with  has a past medical history of Carpal tunnel syndrome, bilateral, Chronic renal insufficiency, COLITIS (12/17/2007), DJD of shoulder, Erectile dysfunction, HTN (hypertension), Hyperlipidemia, PLANTAR FASCIITIS (09/05/2010), Renal cell cancer (HCC), Solitary kidney, acquired, and SYNCOPE (09/05/2010).  Allergic rhinitis Mild to mod, for prednisone taper,,  to f/u any worsening symptoms or concerns  URI with cough and congestion Mild to mod, for antibx course omnicef 300 bid, to f/u any worsening symptoms or concerns  HTN (hypertension) BP Readings from Last 3 Encounters:  11/26/23 122/64  11/22/23 120/76  09/03/23 130/68   Stable, pt to continue  medical treatment norvasc 10 every day, toprol xl 50 qd   Low vitamin B12 level Lab Results  Component Value Date   VITAMINB12 267 11/22/2023   Low, to start oral replacement - b12 1000 mcg qd  Followup: No follow-ups on file.  Oliver Barre, MD 11/27/2023 8:00 PM [facility] - [facility]

## 2023-11-26 NOTE — Patient Instructions (Signed)
 Please take all new medication as prescribed- the antibiotic, and prednisone  Please continue all other medications as before, and refills have been done if requested.  Please have the pharmacy call with any other refills you may need.  Please keep your appointments with your specialists as you may have planned  Hopefully your xray results will be back soon  Have a Good Time in DR!

## 2023-11-27 ENCOUNTER — Telehealth: Payer: Self-pay

## 2023-11-27 ENCOUNTER — Encounter: Payer: Self-pay | Admitting: Internal Medicine

## 2023-11-27 DIAGNOSIS — J069 Acute upper respiratory infection, unspecified: Secondary | ICD-10-CM | POA: Insufficient documentation

## 2023-11-27 MED ORDER — CEFDINIR 300 MG PO CAPS: 300.0000 mg | ORAL_CAPSULE | Freq: Two times a day (BID) | ORAL | 0 refills | Status: DC

## 2023-11-27 NOTE — Assessment & Plan Note (Signed)
 Lab Results  Component Value Date   VITAMINB12 267 11/22/2023   Low, to start oral replacement - b12 1000 mcg qd

## 2023-11-27 NOTE — Assessment & Plan Note (Signed)
Mild to mod, for prednisone taper,,  to f/u any worsening symptoms or concerns  

## 2023-11-27 NOTE — Telephone Encounter (Signed)
Ok to change to [employer] - done erx

## 2023-11-27 NOTE — Assessment & Plan Note (Signed)
Mild to mod, for antibx course - omnicef 300 bid,  to f/u any worsening symptoms or concerns

## 2023-11-27 NOTE — Telephone Encounter (Signed)
 Copied from CRM [id number]. Topic: Clinical - Medication Question >> Nov 27, 2023  9:07 AM Sim Boast F wrote: Reason for CRM: Patient seen Dr. Jonny Ruiz yesterday and was prescribed the Doxycycline, says he's going to the [place] this weekend and wants to know if this will help with sun exposure or if he be prescribed something else?

## 2023-11-27 NOTE — Assessment & Plan Note (Signed)
 BP Readings from Last 3 Encounters:  11/26/23 122/64  11/22/23 120/76  09/03/23 130/68   Stable, pt to continue medical treatment norvasc 10 every day, toprol xl 50 qd

## 2023-11-27 NOTE — Telephone Encounter (Signed)
 Called and let Pt know

## 2023-12-09 ENCOUNTER — Encounter: Payer: Self-pay | Admitting: Internal Medicine

## 2023-12-24 ENCOUNTER — Other Ambulatory Visit: Payer: Self-pay | Admitting: Internal Medicine

## 2023-12-25 ENCOUNTER — Other Ambulatory Visit: Payer: Self-pay

## 2023-12-26 ENCOUNTER — Telehealth: Payer: Self-pay | Admitting: Cardiology

## 2023-12-26 DIAGNOSIS — E7800 Pure hypercholesterolemia, unspecified: Secondary | ICD-10-CM

## 2023-12-26 NOTE — Telephone Encounter (Signed)
 New Message:      Patient says he has an appointment with Dr Audery Blazing next Thursday(01-02-24). He says he would like to have lab work before that appointment, please.

## 2023-12-26 NOTE — Telephone Encounter (Signed)
 Spoke to patient he stated he would like to have a repeat lipid panel done before his appointment with Dr.Crenshaw.Advised I will place order.He will have done 4/18.

## 2023-12-27 LAB — LIPID PANEL
Chol/HDL Ratio: 2.8 ratio (ref 0.0–5.0)
Cholesterol, Total: 122 mg/dL (ref 100–199)
HDL: 44 mg/dL (ref >39)
LDL Chol Calc (NIH): 61 mg/dL (ref 0–99)
Triglycerides: 87 mg/dL (ref 0–149)
VLDL Cholesterol Cal: 17 mg/dL (ref 5–40)

## 2023-12-27 NOTE — Progress Notes (Unsigned)
 HPI: Follow-up coronary artery disease.  Previously followed by Dr. Alois Arnt but transitioning to me.  Calcium  score November 2022 2052 which was 98th percentile.  Aortic valve calcium  score 419.  Follow-up nuclear study December 2022 showed ejection fraction 61% and normal perfusion.  Echocardiogram December 2022 showed normal LV function, grade 1 diastolic dysfunction, trace aortic insufficiency.  Monitor March 2023 showed sinus rhythm with PVCs.  Cardiac catheterization at outside facility in [place] July 2024 showed 95% circumflex, 50% LAD, 60% RCA.  Patient had PCI of the left circumflex at that time.  Since last seen  Current Outpatient Medications  Medication Sig Dispense Refill   amLODipine  (NORVASC ) 5 MG tablet TAKE 1 TABLET BY MOUTH DAILY 90 tablet 3   aspirin 81 MG EC tablet Take 81 mg by mouth daily.     atorvastatin  (LIPITOR) 80 MG tablet Take 1 tablet (80 mg total) by mouth daily. 90 tablet 3   cefdinir  (OMNICEF ) 300 MG capsule Take 1 capsule (300 mg total) by mouth 2 (two) times daily. 20 capsule 0   clopidogrel  (PLAVIX ) 75 MG tablet START 24 hours after last Brilinta dose. Take 4 tablets (300mg ) by mouth once. Then start taking 1 tablet (75mg ) by mouth daily 24 hours thereafter. 90 tablet 3   cyanocobalamin  (,VITAMIN B-12,) 1000 MCG/ML injection Inject into the muscle. weekly     ezetimibe  (ZETIA ) 10 MG tablet TAKE 1 TABLET BY MOUTH DAILY 90 tablet 3   fenofibrate  160 MG tablet TAKE 1 TABLET BY MOUTH DAILY 90 tablet 3   fluticasone  (FLONASE ) 50 MCG/ACT nasal spray USE 2 SPRAYS IN BOTH  NOSTRILS DAILY 48 g 2   metoprolol  succinate (TOPROL -XL) 50 MG 24 hr tablet TAKE 3 TABLETS BY MOUTH DAILY 270 tablet 3   nitroGLYCERIN  (NITROSTAT ) 0.4 MG SL tablet Place 1 tablet (0.4 mg total) under the tongue every 5 (five) minutes as needed for chest pain. 30 tablet 0   No current facility-administered medications for this visit.     Past Medical History:  Diagnosis Date   Carpal  tunnel syndrome, bilateral    Chronic renal insufficiency    COLITIS 12/17/2007   DJD of shoulder    Erectile dysfunction    managed with injections; no response with Viagra/Cialis    HTN (hypertension)    Hyperlipidemia    PLANTAR FASCIITIS 09/05/2010   Renal cell cancer (HCC)    currently in clinical trial using Afinitor   Solitary kidney, acquired    SYNCOPE 09/05/2010    Past Surgical History:  Procedure Laterality Date   NEPHRECTOMY     Right   s.p right CTS release     s/p right ulnar nerve release     VASECTOMY      Social History   Socioeconomic History   Marital status: Married    Spouse name: Not on file   Number of children: 3   Years of education: Not on file   Highest education level: Associate degree: academic program  Occupational History   Occupation: Investment banker, corporate: [employer]  Tobacco Use   Smoking status: Never   Smokeless tobacco: Never   Tobacco comments:    quit 25 yrs ago  Substance and Sexual Activity   Alcohol use: No   Drug use: No   Sexual activity: Yes  Other Topics Concern   Not on file  Social History Narrative   Lives with wife   Social Drivers of Dispensing optician  Resource Strain: Low Risk  (09/02/2023)   Overall Financial Resource Strain (CARDIA)    Difficulty of Paying Living Expenses: Not hard at all  Food Insecurity: No Food Insecurity (09/02/2023)   Hunger Vital Sign    Worried About Running Out of Food in the Last Year: Never true    Ran Out of Food in the Last Year: Never true  Transportation Needs: No Transportation Needs (09/02/2023)   PRAPARE - Administrator, Civil Service (Medical): No    Lack of Transportation (Non-Medical): No  Physical Activity: Sufficiently Active (09/02/2023)   Exercise Vital Sign    Days of Exercise per Week: 5 days    Minutes of Exercise per Session: 30 min  Stress: No Stress Concern Present (09/02/2023)   [employer] of Occupational Health - Occupational  Stress Questionnaire    Feeling of Stress : Not at all  Social Connections: Socially Integrated (09/02/2023)   Social Connection and Isolation Panel [NHANES]    Frequency of Communication with Friends and Family: More than three times a week    Frequency of Social Gatherings with Friends and Family: Twice a week    Attends Religious Services: More than 4 times per year    Active Member of [employer] or Organizations: Yes    Attends Engineer, structural: More than 4 times per year    Marital Status: Married  Catering manager Violence: Unknown (12/13/2021)   Received from [employer], [facility]   HITS    Physically Hurt: Not on file    Insult or Talk Down To: Not on file    Threaten Physical Harm: Not on file    Scream or Curse: Not on file    Family History  Problem Relation Age of Onset   Stroke Father    Transient ischemic attack Father    Hypertension Father 72       CABG   Liver cancer Maternal Grandmother    Heart disease Other     ROS: no fevers or chills, productive cough, hemoptysis, dysphasia, odynophagia, melena, hematochezia, dysuria, hematuria, rash, seizure activity, orthopnea, PND, pedal edema, claudication. Remaining systems are negative.  Physical Exam: Well-developed well-nourished in no acute distress.  Skin is warm and dry.  HEENT is normal.  Neck is supple.  Chest is clear to auscultation with normal expansion.  Cardiovascular exam is regular rate and rhythm.  Abdominal exam nontender or distended. No masses palpated. Extremities show no edema. neuro grossly intact  ECG- personally reviewed  A/P  1 coronary artery disease-patient is status post PCI of the left circumflex and also has a history of significantly elevated calcium  score.  He is not having chest pain.  Continue aspirin and Plavix  (discontinue Plavix  July 2025).  Continue statin.  2 hyperlipidemia-continue Lipitor.  3 hypertension-blood pressure controlled.  Continue present  medical regimen.  4 history of palpitations/PVCs-continue metoprolol .  5 history of renal cell carcinoma-Per oncology.  6 renal insufficiency  Alexandria Angel, MD

## 2024-01-02 ENCOUNTER — Ambulatory Visit: Payer: 59 | Attending: Cardiology | Admitting: Cardiology

## 2024-01-02 ENCOUNTER — Encounter: Payer: Self-pay | Admitting: Cardiology

## 2024-01-02 VITALS — BP 114/71 | HR 89 | Ht 73.5 in | Wt 163.2 lb

## 2024-01-02 DIAGNOSIS — I10 Essential (primary) hypertension: Secondary | ICD-10-CM | POA: Diagnosis not present

## 2024-01-02 DIAGNOSIS — R002 Palpitations: Secondary | ICD-10-CM | POA: Diagnosis not present

## 2024-01-02 DIAGNOSIS — R0989 Other specified symptoms and signs involving the circulatory and respiratory systems: Secondary | ICD-10-CM

## 2024-01-02 DIAGNOSIS — E7800 Pure hypercholesterolemia, unspecified: Secondary | ICD-10-CM | POA: Diagnosis not present

## 2024-01-02 DIAGNOSIS — I2510 Atherosclerotic heart disease of native coronary artery without angina pectoris: Secondary | ICD-10-CM

## 2024-01-02 NOTE — Patient Instructions (Signed)
 Medication Instructions:  Your physician has recommended you make the following change in your medication:  STOP at End of July:  Plavix  *If you need a refill on your cardiac medications before your next appointment, please call your pharmacy*  Testing/Procedures: Your physician has requested that you have a carotid duplex. This test is an ultrasound of the carotid arteries in your neck. It looks at blood flow through these arteries that supply the brain with blood. Allow one hour for this exam. There are no restrictions or special instructions.   Follow-Up: At [facility], you and your health needs are our priority.  As part of our continuing mission to provide you with exceptional heart care, our providers are all part of one team.  This team includes your primary Cardiologist (physician) and Advanced Practice Providers or APPs (Physician Assistants and Nurse Practitioners) who all work together to provide you with the care you need, when you need it.  Your next appointment:   6 month(s)  Provider:   Alexandria Angel, MD     Other Instructions   1st Floor: - Lobby - Registration  - Pharmacy  - Lab - Cafe  2nd Floor: - PV Lab - Diagnostic Testing (echo, CT, nuclear med)  3rd Floor: - Vacant  4th Floor: - TCTS (cardiothoracic surgery) - AFib Clinic - Structural Heart Clinic - Vascular Surgery  - Vascular Ultrasound  5th Floor: - HeartCare Cardiology (general and EP) - Clinical Pharmacy for coumadin, hypertension, lipid, weight-loss medications, and med management appointments    Valet parking services will be available as well.

## 2024-01-23 ENCOUNTER — Ambulatory Visit (HOSPITAL_COMMUNITY)
Admission: RE | Admit: 2024-01-23 | Discharge: 2024-01-23 | Disposition: A | Discharge status: Home / Self Care | Source: Ambulatory Visit | Attending: Cardiology | Admitting: Cardiology

## 2024-01-23 DIAGNOSIS — R0989 Other specified symptoms and signs involving the circulatory and respiratory systems: Secondary | ICD-10-CM | POA: Diagnosis present

## 2024-01-24 ENCOUNTER — Ambulatory Visit: Payer: Self-pay | Admitting: Cardiology

## 2024-01-27 ENCOUNTER — Ambulatory Visit: Payer: Self-pay

## 2024-01-27 NOTE — Telephone Encounter (Signed)
  Chief Complaint: back pain Symptoms: x 3 weeks Frequency: chronic Pertinent Negatives: Patient denies loss of bladder control Disposition: [] ED /[] Urgent Care (no appt availability in office) / [] Appointment(In office/virtual)/ []  [facility]/ [] Home Care/ [] Refused Recommended Disposition /[] [place] [facility]/ [x]  Follow-up with PCP Additional Notes: Appointment scheduled, visited chiropractor without relief. He would like to continue with chiropractor or PT with use of oral steroid Copied from CRM [id number]. Topic: Clinical - Medication Question >> Jan 27, 2024 12:26 PM Chuck Crater wrote: Reason for CRM: Patient is in pain and stated that he took prednidsone in the past and is requesting it to be sent to his pharmacy. Reason for Disposition  [1] Pain radiates into the thigh or further down the leg AND [2] one leg  Answer Assessment - Initial Assessment Questions 1. ONSET: "When did the pain begin?"      Three weeks 2. LOCATION: "Where does it hurt?" (upper, mid or lower back)     Lower back  3. SEVERITY: "How bad is the pain?"  (e.g., Scale 1-10; mild, moderate, or severe)   - MILD (1-3): Doesn't interfere with normal activities.    - MODERATE (4-7): Interferes with normal activities or awakens from sleep.    - SEVERE (8-10): Excruciating pain, unable to do any normal activities.      7 4. PATTERN: "Is the pain constant?" (e.g., yes, no; constant, intermittent)      constant 5. RADIATION: "Does the pain shoot into your legs or somewhere else?"     Radiation to left leg 6. CAUSE:  "What do you think is causing the back pain?"      Back pain for years 7. BACK OVERUSE:  "Any recent lifting of heavy objects, strenuous work or exercise?"     no 8. MEDICINES: "What have you taken so far for the pain?" (e.g., nothing, acetaminophen , NSAIDS)     tylenol  1000 mg q 6- 8 hours 9. NEUROLOGIC SYMPTOMS: "Do you have any weakness, numbness, or problems with bowel/bladder  control?"     no 10. OTHER SYMPTOMS: "Do you have any other symptoms?" (e.g., fever, abdomen pain, burning with urination, blood in urine)       Difficulty standing and walking  Protocols used: Back Pain-A-AH

## 2024-01-28 ENCOUNTER — Encounter: Payer: Self-pay | Admitting: Internal Medicine

## 2024-01-28 ENCOUNTER — Ambulatory Visit: Admitting: Internal Medicine

## 2024-01-28 VITALS — BP 110/68 | HR 55 | Temp 98.1°F | Ht 73.5 in | Wt 166.0 lb

## 2024-01-28 DIAGNOSIS — N1831 Chronic kidney disease, stage 3a: Secondary | ICD-10-CM | POA: Diagnosis not present

## 2024-01-28 DIAGNOSIS — R7989 Other specified abnormal findings of blood chemistry: Secondary | ICD-10-CM

## 2024-01-28 DIAGNOSIS — R739 Hyperglycemia, unspecified: Secondary | ICD-10-CM | POA: Diagnosis not present

## 2024-01-28 DIAGNOSIS — M5410 Radiculopathy, site unspecified: Secondary | ICD-10-CM | POA: Diagnosis not present

## 2024-01-28 DIAGNOSIS — Z23 Encounter for immunization: Secondary | ICD-10-CM

## 2024-01-28 DIAGNOSIS — I10 Essential (primary) hypertension: Secondary | ICD-10-CM

## 2024-01-28 MED ORDER — CYCLOBENZAPRINE HCL 5 MG PO TABS: 5.0000 mg | ORAL_TABLET | Freq: Three times a day (TID) | ORAL | 1 refills | Status: DC | PRN

## 2024-01-28 MED ORDER — PREDNISONE 10 MG PO TABS: ORAL_TABLET | ORAL | 0 refills | Status: DC

## 2024-01-28 NOTE — Progress Notes (Signed)
 Chief Complaint: follow up chronic lbp, htn, hyperglycemia, b12 deficiency, ckd3a       HPI:  Brandon Sanders is a 68 y.o. 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 Has hx of several episodes of PT and multiple other meds such as gabapentin , and recent chiropracter visit.   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  Pain now severe, constant, worse to stand and cannot even finish brushing his teeth at the sink.  Trying to limit narcotic due to risk of worsening constipation.  Has been walking up to 6000 steps per day recently but not in the past few days.  Has seen cardiology recently and doing well per pt, also had Carotid U/S without significant blockages.  Pt denies chest pain, increased sob or doe, wheezing, orthopnea, PND, increased LE swelling, palpitations, dizziness or syncope.   Pt denies polydipsia, polyuria, or new focal neuro s/s.    Due for shingrix #1 today Wt Readings from Last 3 Encounters:  01/28/24 166 lb (75.3 kg)  01/02/24 163 lb 3.2 oz (74 kg)  11/26/23 162 lb (73.5 kg)   BP Readings from Last 3 Encounters:  01/28/24 110/68  01/02/24 114/71  11/26/23 122/64         Past Medical History:  Diagnosis Date   Carpal tunnel syndrome, bilateral    Chronic renal insufficiency    COLITIS 12/17/2007   DJD of shoulder    Erectile dysfunction    managed with injections; no response with Viagra/Cialis    HTN (hypertension)    Hyperlipidemia    PLANTAR FASCIITIS 09/05/2010   Renal cell cancer (HCC)    currently in clinical trial using Afinitor   Solitary kidney, acquired    SYNCOPE 09/05/2010   Past Surgical History:  Procedure Laterality Date   NEPHRECTOMY     Right   s.p right CTS release     s/p right ulnar nerve release     VASECTOMY      reports that he has never smoked. He has never  used smokeless tobacco. He reports that he does not drink alcohol and does not use drugs. family history includes Heart disease in an other family member; Hypertension (age of onset: 5) in his father; Liver cancer in his maternal grandmother; Stroke in his father; Transient ischemic attack in his father. No Known Allergies Current Outpatient Medications on File Prior to Visit  Medication Sig Dispense Refill   amLODipine  (NORVASC ) 5 MG tablet TAKE 1 TABLET BY MOUTH DAILY 90 tablet 3   aspirin 81 MG EC tablet Take 81 mg by mouth daily.     clopidogrel  (PLAVIX ) 75 MG tablet START 24 hours after last Brilinta dose. Take 4 tablets (300mg ) by mouth once. Then start taking 1 tablet (75mg ) by mouth daily 24 hours thereafter. 90 tablet 3   cyanocobalamin  (,VITAMIN B-12,) 1000 MCG/ML injection Inject into the muscle. weekly     ezetimibe  (ZETIA ) 10 MG tablet TAKE 1 TABLET BY MOUTH DAILY 90 tablet 3   fenofibrate  160 MG tablet TAKE 1 TABLET BY MOUTH DAILY 90 tablet 3   fluticasone  (FLONASE ) 50 MCG/ACT nasal spray USE 2 SPRAYS IN BOTH  NOSTRILS DAILY 48 g 2   metoprolol  succinate (TOPROL -XL) 50 MG 24 hr tablet TAKE 3 TABLETS BY MOUTH DAILY (  Patient taking differently: Take 100 mg by mouth daily.) 270 tablet 3   nitroGLYCERIN  (NITROSTAT ) 0.4 MG SL tablet Place 1 tablet (0.4 mg total) under the tongue every 5 (five) minutes as needed for chest pain. 30 tablet 0   atorvastatin  (LIPITOR) 80 MG tablet Take 1 tablet (80 mg total) by mouth daily. 90 tablet 3   cefdinir  (OMNICEF ) 300 MG capsule Take 1 capsule (300 mg total) by mouth 2 (two) times daily. (Patient not taking: Reported on 01/28/2024) 20 capsule 0   No current facility-administered medications on file prior to visit.        ROS:  All others reviewed and negative.  Objective        PE:  BP 110/68 (BP Location: Right Arm, Patient Position: Sitting, Cuff Size: Normal)   Pulse (!) 55   Temp 98.1 F (36.7 C) (Oral)   Ht 6' 1.5" (1.867 m)   Wt 166 lb  (75.3 kg)   SpO2 100%   BMI 21.60 kg/m                 Constitutional: Pt appears in NAD               HENT: Head: NCAT.                Right Ear: External ear normal.                 Left Ear: External ear normal.                Eyes: . Pupils are equal, round, and reactive to light. Conjunctivae and EOM are normal               Nose: without d/c or deformity               Neck: Neck supple. Gross normal ROM               Cardiovascular: Normal rate and regular rhythm.                 Pulmonary/Chest: Effort normal and breath sounds without rales or wheezing.                Abd:  Soft, NT, ND, + BS, no organomegaly               Neurological: Pt is alert. At baseline orientation, motor grossly intact               Skin: Skin is warm. No rashes, no other new lesions, LE edema - none               Spine with mild tenderness only in the midline lowest lumbar               Psychiatric: Pt behavior is normal without agitation   Micro: none  Cardiac tracings I have personally interpreted today:  none  Pertinent Radiological findings (summarize): none   Lab Results  Component Value Date   WBC 5.0 11/22/2023   HGB 15.4 11/22/2023   HCT 46.2 11/22/2023   PLT 212.0 11/22/2023   GLUCOSE 86 11/22/2023   CHOL 122 12/27/2023   TRIG 87 12/27/2023   HDL 44 12/27/2023   LDLDIRECT 90.2 10/27/2009   LDLCALC 61 12/27/2023   ALT 21 11/22/2023   AST 30 11/22/2023   NA 136 11/22/2023   K 4.2 11/22/2023   CL 96 11/22/2023   CREATININE 1.56 (H) 11/22/2023   BUN  19 11/22/2023   CO2 28 11/22/2023   TSH 3.45 11/22/2023   PSA 2.78 11/22/2023   INR 0.92 02/16/2011   HGBA1C 5.7 11/22/2023   MICROALBUR 4.8 (H) 11/22/2023   Assessment/Plan:  DRAYDON CLAIRMONT is a 68 y.o. White or Caucasian [1] male with  has a past medical history of Carpal tunnel syndrome, bilateral, Chronic renal insufficiency, COLITIS (12/17/2007), DJD of shoulder, Erectile dysfunction, HTN (hypertension), Hyperlipidemia, PLANTAR  FASCIITIS (09/05/2010), Renal cell cancer (HCC), Solitary kidney, acquired, and SYNCOPE (09/05/2010).  Radiculitis With flare related to underlying lumbar DDD and nerve encroachment - pt has exhausted multiple other modailities for tx, ok for prednisone  taper now as unable for ESI on plavix  that cannot be held.   to f/u any worsening symptoms or concerns  HTN (hypertension) BP Readings from Last 3 Encounters:  01/28/24 110/68  01/02/24 114/71  11/26/23 122/64   Stable, pt to continue medical treatment norvasc  5 every day - toprol  xl 50 - 3 per day   CKD (chronic kidney disease) stage 3, GFR 30-59 ml/min (HCC) Lab Results  Component Value Date   CREATININE 1.56 (H) 11/22/2023   Stable overall, cont to avoid nephrotoxins   Hyperglycemia Lab Results  Component Value Date   HGBA1C 5.7 11/22/2023   Stable, pt to continue current medical treatment  - diet, wt control   Low vitamin B12 level Lab Results  Component Value Date   VITAMINB12 267 11/22/2023   Low, for start oral replacement - b12 1000 mcg qd  Followup: Return in 6 months (on 07/30/2024), or if symptoms worsen or fail to improve.  Rosalia Colonel, MD 01/29/2024 8:51 PM [facility]

## 2024-01-28 NOTE — Patient Instructions (Addendum)
 You had the Shingrix shot #1 today  Please make Nurse Visit appt for Shingles shot #2 in 2 months  Please take all new medication as prescribed - the prednisone , as well as the muscle relaxer as needed  Please continue all other medications as before, including the tramadol as needed  Please have the pharmacy call with any other refills you may need.  Please continue your efforts at being more active, low cholesterol diet, and weight control.  Please keep your appointments with your specialists as you may have planned

## 2024-01-29 ENCOUNTER — Other Ambulatory Visit: Payer: Self-pay | Admitting: Adult Health

## 2024-01-29 ENCOUNTER — Encounter: Payer: Self-pay | Admitting: Internal Medicine

## 2024-01-29 NOTE — Assessment & Plan Note (Signed)
 BP Readings from Last 3 Encounters:  01/28/24 110/68  01/02/24 114/71  11/26/23 122/64   Stable, pt to continue medical treatment norvasc  5 every day - toprol  xl 50 - 3 per day

## 2024-01-29 NOTE — Assessment & Plan Note (Signed)
 Lab Results  Component Value Date   CREATININE 1.56 (H) 11/22/2023   Stable overall, cont to avoid nephrotoxins

## 2024-01-29 NOTE — Assessment & Plan Note (Signed)
 Lab Results  Component Value Date   VITAMINB12 267 11/22/2023   Low, for start oral replacement - b12 1000 mcg qd

## 2024-01-29 NOTE — Assessment & Plan Note (Signed)
 With flare related to underlying lumbar DDD and nerve encroachment - pt has exhausted multiple other modailities for tx, ok for prednisone  taper now as unable for ESI on plavix  that cannot be held.   to f/u any worsening symptoms or concerns

## 2024-01-29 NOTE — Assessment & Plan Note (Signed)
 Lab Results  Component Value Date   HGBA1C 5.7 11/22/2023   Stable, pt to continue current medical treatment  - diet, wt control

## 2024-02-06 ENCOUNTER — Encounter: Payer: Self-pay | Admitting: Internal Medicine

## 2024-02-06 ENCOUNTER — Ambulatory Visit: Admitting: Internal Medicine

## 2024-02-06 VITALS — BP 120/76 | HR 53 | Temp 97.9°F | Ht 73.5 in | Wt 163.0 lb

## 2024-02-06 DIAGNOSIS — R7989 Other specified abnormal findings of blood chemistry: Secondary | ICD-10-CM | POA: Diagnosis not present

## 2024-02-06 DIAGNOSIS — R3912 Poor urinary stream: Secondary | ICD-10-CM

## 2024-02-06 DIAGNOSIS — N1831 Chronic kidney disease, stage 3a: Secondary | ICD-10-CM

## 2024-02-06 DIAGNOSIS — I10 Essential (primary) hypertension: Secondary | ICD-10-CM

## 2024-02-06 MED ORDER — TAMSULOSIN HCL 0.4 MG PO CAPS: 0.4000 mg | ORAL_CAPSULE | Freq: Every day | ORAL | 3 refills | Status: DC

## 2024-02-06 NOTE — Assessment & Plan Note (Signed)
 Lab Results  Component Value Date   VITAMINB12 267 11/22/2023   Low, reminded to start oral replacement - b12 1000 mcg qd

## 2024-02-06 NOTE — Assessment & Plan Note (Signed)
 Lab Results  Component Value Date   CREATININE 1.56 (H) 11/22/2023   Stable overall, cont to avoid nephrotoxins

## 2024-02-06 NOTE — Assessment & Plan Note (Signed)
 BP Readings from Last 3 Encounters:  02/06/24 120/76  01/28/24 110/68  01/02/24 114/71   Stable, pt to continue medical treatment noravsc 5 mg every day, toprol  xl 50 mg - 3 qd

## 2024-02-06 NOTE — Assessment & Plan Note (Signed)
 Likely BPH with nocturia - for trial flomax  0.4 mg every day, and for urology if not well enough improved

## 2024-02-06 NOTE — Progress Notes (Signed)
 Patient ID: Brandon Sanders, male   DOB: 06-Mar-1956, 68 y.o.   MRN: [id number]        Chief Complaint: follow up slow urinary stream       HPI:  DONALDSON RICHTER is a 68 y.o. male here with c/o 1 mo persistent slowing and weakness urinary stream;  Denies urinary symptoms such as dysuria, frequency, urgency, flank pain, hematuria or n/v, fever, chills.  Has to strain and push to start, also getting up 3-4 times at night.  Pt denies chest pain, increased sob or doe, wheezing, orthopnea, PND, increased LE swelling, palpitations, dizziness or syncope.   Pt denies polydipsia, polyuria, or new focal neuro s/s.          Wt Readings from Last 3 Encounters:  02/06/24 163 lb (73.9 kg)  01/28/24 166 lb (75.3 kg)  01/02/24 163 lb 3.2 oz (74 kg)   BP Readings from Last 3 Encounters:  02/06/24 120/76  01/28/24 110/68  01/02/24 114/71         Past Medical History:  Diagnosis Date   Carpal tunnel syndrome, bilateral    Chronic renal insufficiency    COLITIS 12/17/2007   DJD of shoulder    Erectile dysfunction    managed with injections; no response with Viagra/Cialis    HTN (hypertension)    Hyperlipidemia    PLANTAR FASCIITIS 09/05/2010   Renal cell cancer (HCC)    currently in clinical trial using Afinitor   Solitary kidney, acquired    SYNCOPE 09/05/2010   Past Surgical History:  Procedure Laterality Date   NEPHRECTOMY     Right   s.p right CTS release     s/p right ulnar nerve release     VASECTOMY      reports that he has never smoked. He has never used smokeless tobacco. He reports that he does not drink alcohol and does not use drugs. family history includes Heart disease in an other family member; Hypertension (age of onset: 52) in his father; Liver cancer in his maternal grandmother; Stroke in his father; Transient ischemic attack in his father. No Known Allergies Current Outpatient Medications on File Prior to Visit  Medication Sig Dispense Refill   amLODipine  (NORVASC ) 5 MG tablet  TAKE 1 TABLET BY MOUTH DAILY 90 tablet 3   aspirin 81 MG EC tablet Take 81 mg by mouth daily.     clopidogrel  (PLAVIX ) 75 MG tablet START 24 hours after last Brilinta dose. Take 4 tablets (300mg ) by mouth once. Then start taking 1 tablet (75mg ) by mouth daily 24 hours thereafter. 90 tablet 3   cyanocobalamin  (,VITAMIN B-12,) 1000 MCG/ML injection Inject into the muscle. weekly     cyclobenzaprine (FLEXERIL) 5 MG tablet Take 1 tablet (5 mg total) by mouth 3 (three) times daily as needed. 40 tablet 1   ezetimibe  (ZETIA ) 10 MG tablet TAKE 1 TABLET BY MOUTH DAILY 90 tablet 3   fenofibrate  160 MG tablet TAKE 1 TABLET BY MOUTH DAILY 90 tablet 3   fluticasone  (FLONASE ) 50 MCG/ACT nasal spray USE 2 SPRAYS IN BOTH  NOSTRILS DAILY 48 g 2   metoprolol  succinate (TOPROL -XL) 50 MG 24 hr tablet TAKE 3 TABLETS BY MOUTH DAILY (Patient taking differently: Take 100 mg by mouth daily.) 270 tablet 3   nitroGLYCERIN  (NITROSTAT ) 0.4 MG SL tablet Place 1 tablet (0.4 mg total) under the tongue every 5 (five) minutes as needed for chest pain. 30 tablet 0   predniSONE  (DELTASONE ) 10 MG tablet 3 tabs  by mouth per day for 3 days,2tabs per day for 3 days,1tab per day for 3 days 18 tablet 0   atorvastatin  (LIPITOR) 80 MG tablet Take 1 tablet (80 mg total) by mouth daily. 90 tablet 3   cefdinir  (OMNICEF ) 300 MG capsule Take 1 capsule (300 mg total) by mouth 2 (two) times daily. (Patient not taking: Reported on 01/02/2024) 20 capsule 0   No current facility-administered medications on file prior to visit.        ROS:  All others reviewed and negative.  Objective        PE:  BP 120/76 (BP Location: Right Arm, Patient Position: Sitting, Cuff Size: Normal)   Pulse (!) 53   Temp 97.9 F (36.6 C) (Oral)   Ht 6' 1.5" (1.867 m)   Wt 163 lb (73.9 kg)   SpO2 100%   BMI 21.21 kg/m                 Constitutional: Pt appears in NAD               HENT: Head: NCAT.                Right Ear: External ear normal.                  Left Ear: External ear normal.                Eyes: . Pupils are equal, round, and reactive to light. Conjunctivae and EOM are normal               Nose: without d/c or deformity               Neck: Neck supple. Gross normal ROM               Cardiovascular: Normal rate and regular rhythm.                 Pulmonary/Chest: Effort normal and breath sounds without rales or wheezing.                Abd:  Soft, NT, ND, + BS, no organomegaly               Neurological: Pt is alert. At baseline orientation, motor grossly intact               Skin: Skin is warm. No rashes, no other new lesions, LE edema - none               Psychiatric: Pt behavior is normal without agitation   Micro: none  Cardiac tracings I have personally interpreted today:  none  Pertinent Radiological findings (summarize): none   Lab Results  Component Value Date   WBC 5.0 11/22/2023   HGB 15.4 11/22/2023   HCT 46.2 11/22/2023   PLT 212.0 11/22/2023   GLUCOSE 86 11/22/2023   CHOL 122 12/27/2023   TRIG 87 12/27/2023   HDL 44 12/27/2023   LDLDIRECT 90.2 10/27/2009   LDLCALC 61 12/27/2023   ALT 21 11/22/2023   AST 30 11/22/2023   NA 136 11/22/2023   K 4.2 11/22/2023   CL 96 11/22/2023   CREATININE 1.56 (H) 11/22/2023   BUN 19 11/22/2023   CO2 28 11/22/2023   TSH 3.45 11/22/2023   PSA 2.78 11/22/2023   INR 0.92 02/16/2011   HGBA1C 5.7 11/22/2023   MICROALBUR 4.8 (H) 11/22/2023   Assessment/Plan:  MACCOY HAUBNER is a 68 y.o. White  or Caucasian [1] male with  has a past medical history of Carpal tunnel syndrome, bilateral, Chronic renal insufficiency, COLITIS (12/17/2007), DJD of shoulder, Erectile dysfunction, HTN (hypertension), Hyperlipidemia, PLANTAR FASCIITIS (09/05/2010), Renal cell cancer (HCC), Solitary kidney, acquired, and SYNCOPE (09/05/2010).  Weak urine stream Likely BPH with nocturia - for trial flomax 0.4 mg every day, and for urology if not well enough improved  HTN (hypertension) BP Readings  from Last 3 Encounters:  02/06/24 120/76  01/28/24 110/68  01/02/24 114/71   Stable, pt to continue medical treatment noravsc 5 mg every day, toprol  xl 50 mg - 3 qd   CKD (chronic kidney disease) stage 3, GFR 30-59 ml/min (HCC) Lab Results  Component Value Date   CREATININE 1.56 (H) 11/22/2023   Stable overall, cont to avoid nephrotoxins   Low vitamin B12 level Lab Results  Component Value Date   VITAMINB12 267 11/22/2023   Low, reminded to start oral replacement - b12 1000 mcg qd  Followup: Return if symptoms worsen or fail to improve.  Rosalia Colonel, MD 02/06/2024 7:32 PM  [facility] - [facility]

## 2024-02-06 NOTE — Patient Instructions (Signed)
 Please take all new medication as prescribed - the flomax  Please let us  know next wk if not working well enough, to consider local Urology referral   Please continue all other medications as before, and refills have been done if requested.  Please have the pharmacy call with any other refills you may need.  Please keep your appointments with your specialists as you may have planned

## 2024-02-07 ENCOUNTER — Encounter: Payer: Self-pay | Admitting: Internal Medicine

## 2024-03-03 ENCOUNTER — Other Ambulatory Visit: Payer: Self-pay

## 2024-03-03 MED ORDER — CLOPIDOGREL BISULFATE 75 MG PO TABS: 75.0000 mg | ORAL_TABLET | Freq: Every day | ORAL | 2 refills | Status: DC

## 2024-03-17 ENCOUNTER — Encounter: Payer: Self-pay | Admitting: Cardiology

## 2024-04-03 ENCOUNTER — Telehealth: Payer: Self-pay | Admitting: Internal Medicine

## 2024-04-03 DIAGNOSIS — R3912 Poor urinary stream: Secondary | ICD-10-CM

## 2024-04-03 NOTE — Telephone Encounter (Signed)
 Copied from CRM [id number]. Topic: General - Other >> Apr 03, 2024 12:07 PM Paige D wrote: Reason for CRM: Pt would like to know if he can come get his 2nd shingles vaccine if office has it in stock please call pt back to update schedule

## 2024-04-03 NOTE — Telephone Encounter (Signed)
 Copied from CRM [id number]. Topic: Referral - Request for Referral >> Apr 03, 2024 12:05 PM Carlyon D wrote: Did the patient discuss referral with their provider in the last year? Yes (If No - schedule appointment) (If Yes - send message)  Appointment offered? Yes  Type of order/referral and detailed reason for visit: urology  Preference of office, provider, location: PT does not mind where   If referral order, have you been seen by this specialty before? Yes years ago.  (If Yes, this issue or another issue? When? Where?  Can we respond through MyChart? Yes

## 2024-04-05 ENCOUNTER — Other Ambulatory Visit: Payer: Self-pay | Admitting: Adult Health

## 2024-04-08 ENCOUNTER — Ambulatory Visit (INDEPENDENT_AMBULATORY_CARE_PROVIDER_SITE_OTHER)

## 2024-04-08 DIAGNOSIS — Z23 Encounter for immunization: Secondary | ICD-10-CM | POA: Diagnosis not present

## 2024-04-08 NOTE — Progress Notes (Signed)
 Patient presents in office today for Shingles vaccine. Tolerated injection well.

## 2024-05-15 ENCOUNTER — Ambulatory Visit (INDEPENDENT_AMBULATORY_CARE_PROVIDER_SITE_OTHER): Admitting: Internal Medicine

## 2024-05-15 ENCOUNTER — Encounter: Payer: Self-pay | Admitting: Internal Medicine

## 2024-05-15 VITALS — BP 112/70 | HR 60 | Temp 98.2°F | Ht 73.5 in | Wt 165.2 lb

## 2024-05-15 DIAGNOSIS — Z125 Encounter for screening for malignant neoplasm of prostate: Secondary | ICD-10-CM

## 2024-05-15 DIAGNOSIS — Z0001 Encounter for general adult medical examination with abnormal findings: Secondary | ICD-10-CM

## 2024-05-15 DIAGNOSIS — I10 Essential (primary) hypertension: Secondary | ICD-10-CM | POA: Diagnosis not present

## 2024-05-15 DIAGNOSIS — E559 Vitamin D deficiency, unspecified: Secondary | ICD-10-CM

## 2024-05-15 DIAGNOSIS — R7989 Other specified abnormal findings of blood chemistry: Secondary | ICD-10-CM

## 2024-05-15 DIAGNOSIS — R739 Hyperglycemia, unspecified: Secondary | ICD-10-CM

## 2024-05-15 DIAGNOSIS — N400 Enlarged prostate without lower urinary tract symptoms: Secondary | ICD-10-CM

## 2024-05-15 DIAGNOSIS — E7800 Pure hypercholesterolemia, unspecified: Secondary | ICD-10-CM

## 2024-05-15 DIAGNOSIS — N1831 Chronic kidney disease, stage 3a: Secondary | ICD-10-CM

## 2024-05-15 DIAGNOSIS — C649 Malignant neoplasm of unspecified kidney, except renal pelvis: Secondary | ICD-10-CM

## 2024-05-15 NOTE — Progress Notes (Signed)
 Patient ID: Brandon Sanders, male   DOB: 05/18/1956, 68 y.o.   MRN: [id number]         Chief Complaint:: wellness exam and bph, low b12, renal cell ca, ckd3a, htn, hld, hyperglycemia       HPI:  Brandon Sanders is a 68 y.o. male here for wellness exam; declines colonoscopy, for tdap at pharmacy, o/w up to date                        Also Pt denies chest pain, increased sob or doe, wheezing, orthopnea, PND, increased LE swelling, palpitations, dizziness or syncope.   Pt denies polydipsia, polyuria, or new focal neuro s/s.    Pt denies fever, wt loss, night sweats, loss of appetite, or other constitutional symptoms  Does have mild worsening urinary stream though Denies urinary symptoms such as dysuria, frequency, urgency, flank pain, hematuria or n/v, fever, chills.  Does not want urology referral for now, but will if worsens and will continue flomax  for now as is.     Wt Readings from Last 3 Encounters:  05/15/24 165 lb 3.2 oz (74.9 kg)  02/06/24 163 lb (73.9 kg)  01/28/24 166 lb (75.3 kg)   BP Readings from Last 3 Encounters:  05/15/24 112/70  02/06/24 120/76  01/28/24 110/68   Immunization History  Administered Date(s) Administered   H1N1 07/12/2008   INFLUENZA, HIGH DOSE SEASONAL PF 06/09/2015   Influenza Whole 07/12/2008   Influenza, Seasonal, Injecte, Preservative Fre 06/03/2014   Influenza,inj,Quad PF,6+ Mos 06/10/2014, 06/19/2018, 05/21/2019, 05/26/2020   Influenza,inj,quad, With Preservative 06/10/2017   Influenza-Unspecified 07/12/2011, 06/16/2012, 06/10/2014, 07/12/2015, 06/19/2018   PFIZER(Purple Top)SARS-COV-2 Vaccination 10/21/2019, 12/09/2019, 07/26/2020   Td 10/11/2000   Tdap 12/26/2010   Unspecified SARS-COV-2 Vaccination 11/18/2019   Zoster Recombinant(Shingrix ) 01/28/2024, 04/08/2024   Health Maintenance Due  Topic Date Due   Colonoscopy  12/14/2017   DTaP/Tdap/Td (3 - Td or Tdap) 12/25/2020      Past Medical History:  Diagnosis Date   Carpal tunnel syndrome,  bilateral    Cataract    Chronic renal insufficiency    COLITIS 12/17/2007   DJD of shoulder    Erectile dysfunction    managed with injections; no response with Viagra/Cialis    HTN (hypertension)    Hyperlipidemia    PLANTAR FASCIITIS 09/05/2010   Renal cell cancer (HCC)    currently in clinical trial using Afinitor   Solitary kidney, acquired    SYNCOPE 09/05/2010   Past Surgical History:  Procedure Laterality Date   NEPHRECTOMY     Right   s.p right CTS release     s/p right ulnar nerve release     VASECTOMY      reports that he has never smoked. He has never used smokeless tobacco. He reports current alcohol use of about 1.0 standard drink of alcohol per week. He reports that he does not use drugs. family history includes Diabetes in his mother; Heart disease in his father and another family member; Hypertension (age of onset: 61) in his father; Liver cancer in his maternal grandmother; Stroke in his father; Transient ischemic attack in his father. No Known Allergies Current Outpatient Medications on File Prior to Visit  Medication Sig Dispense Refill   amLODipine  (NORVASC ) 5 MG tablet TAKE 1 TABLET BY MOUTH DAILY 90 tablet 3   aspirin 81 MG EC tablet Take 81 mg by mouth daily.     atorvastatin  (LIPITOR) 80 MG tablet TAKE 1  TABLET BY MOUTH DAILY 90 tablet 3   cyanocobalamin  (,VITAMIN B-12,) 1000 MCG/ML injection Inject into the muscle. weekly     ezetimibe  (ZETIA ) 10 MG tablet TAKE 1 TABLET BY MOUTH DAILY 90 tablet 3   fenofibrate  160 MG tablet TAKE 1 TABLET BY MOUTH DAILY 90 tablet 3   fluticasone  (FLONASE ) 50 MCG/ACT nasal spray USE 2 SPRAYS IN BOTH  NOSTRILS DAILY 48 g 2   nitroGLYCERIN  (NITROSTAT ) 0.4 MG SL tablet Place 1 tablet (0.4 mg total) under the tongue every 5 (five) minutes as needed for chest pain. 30 tablet 0   tamsulosin  (FLOMAX ) 0.4 MG CAPS capsule Take 1 capsule (0.4 mg total) by mouth daily. 90 capsule 3   No current facility-administered medications on  file prior to visit.        ROS:  All others reviewed and negative.  Objective        PE:  BP 112/70   Pulse 60   Temp 98.2 F (36.8 C)   Ht 6' 1.5 (1.867 m)   Wt 165 lb 3.2 oz (74.9 kg)   SpO2 99%   BMI 21.50 kg/m                 Constitutional: Pt appears in NAD               HENT: Head: NCAT.                Right Ear: External ear normal.                 Left Ear: External ear normal.                Eyes: . Pupils are equal, round, and reactive to light. Conjunctivae and EOM are normal               Nose: without d/c or deformity               Neck: Neck supple. Gross normal ROM               Cardiovascular: Normal rate and regular rhythm.                 Pulmonary/Chest: Effort normal and breath sounds without rales or wheezing.                Abd:  Soft, NT, ND, + BS, no organomegaly               Neurological: Pt is alert. At baseline orientation, motor grossly intact               Skin: Skin is warm. No rashes, no other new lesions, LE edema - none               Psychiatric: Pt behavior is normal without agitation   Micro: none  Cardiac tracings I have personally interpreted today:  none  Pertinent Radiological findings (summarize): none   Lab Results  Component Value Date   WBC 5.0 11/22/2023   HGB 15.4 11/22/2023   HCT 46.2 11/22/2023   PLT 212.0 11/22/2023   GLUCOSE 86 11/22/2023   CHOL 122 12/27/2023   TRIG 87 12/27/2023   HDL 44 12/27/2023   LDLDIRECT 90.2 10/27/2009   LDLCALC 61 12/27/2023   ALT 21 11/22/2023   AST 30 11/22/2023   NA 136 11/22/2023   K 4.2 11/22/2023   CL 96 11/22/2023   CREATININE 1.56 (H) 11/22/2023   BUN 19  11/22/2023   CO2 28 11/22/2023   TSH 3.45 11/22/2023   PSA 2.78 11/22/2023   INR 0.92 02/16/2011   HGBA1C 5.7 11/22/2023   MICROALBUR 4.8 (H) 11/22/2023   Assessment/Plan:  Brandon Sanders is a 68 y.o. White or Caucasian [1] male with  has a past medical history of Carpal tunnel syndrome, bilateral, Cataract, Chronic  renal insufficiency, COLITIS (12/17/2007), DJD of shoulder, Erectile dysfunction, HTN (hypertension), Hyperlipidemia, PLANTAR FASCIITIS (09/05/2010), Renal cell cancer (HCC), Solitary kidney, acquired, and SYNCOPE (09/05/2010).  Encounter for well adult exam with abnormal findings Age and sex appropriate education and counseling updated with regular exercise and diet Referrals for preventative services - declines colonoscopy Immunizations addressed - for tdap at pharmacy Smoking counseling  - none needed Evidence for depression or other mood disorder - none significant Most recent labs reviewed. I have personally reviewed and have noted: 1) the patient's medical and social history 2) The patient's current medications and supplements 3) The patient's height, weight, and BMI have been recorded in the chart   Renal cell carcinoma (HCC) Pt reports no recurrence since 2017, has regular f/u scans  CKD (chronic kidney disease) stage 3, GFR 30-59 ml/min (HCC) Lab Results  Component Value Date   CREATININE 1.56 (H) 11/22/2023   Stable overall with one kidney, cont to avoid nephrotoxins   HTN (hypertension) BP Readings from Last 3 Encounters:  05/15/24 112/70  02/06/24 120/76  01/28/24 110/68   Stable, pt to continue medical treatment norvasc  5 1d, toprol  xl 150 qd   HYPERCHOLESTEROLEMIA Lab Results  Component Value Date   LDLCALC 61 12/27/2023   Stable, pt to continue current statin lipitor 80 mg every day, zetia  10 mg every day, fenofibrate  160 qd   Hyperglycemia Lab Results  Component Value Date   HGBA1C 5.7 11/22/2023   Stable, pt to continue current medical treatment - diet, wt control   Low vitamin B12 level Lab Results  Component Value Date   VITAMINB12 267 11/22/2023   Low, to start injection replacement - b12 1000 mcg monthly  BPH (benign prostatic hyperplasia) With mild worsening symptoms, declines change in flomax  or urology referral, will let us   know  Followup: No follow-ups on file.  Lynwood Rush, MD 05/17/2024 3:55 PM [facility]

## 2024-05-15 NOTE — Patient Instructions (Signed)
 Please continue all other medications as before, and refills have been done if requested.  Please have the pharmacy call with any other refills you may need.  Please continue your efforts at being more active, low cholesterol diet, and weight control.  You are otherwise up to date with prevention measures today.  Please keep your appointments with your specialists as you may have planned  Let us  know if you would want the referral to Urology  Please go to the LAB at the blood drawing area for the tests to be done  You will be contacted by phone if any changes need to be made immediately.  Otherwise, you will receive a letter about your results with an explanation, but please check with MyChart first.  Please make an Appointment to return in 6 months, or sooner if needed

## 2024-05-17 DIAGNOSIS — N400 Enlarged prostate without lower urinary tract symptoms: Secondary | ICD-10-CM | POA: Insufficient documentation

## 2024-05-17 MED ORDER — METOPROLOL SUCCINATE ER 50 MG PO TB24: 100.0000 mg | ORAL_TABLET | Freq: Every day | ORAL | Status: AC

## 2024-05-17 NOTE — Assessment & Plan Note (Signed)
 Pt reports no recurrence since 2017, has regular f/u scans

## 2024-05-17 NOTE — Assessment & Plan Note (Signed)
 Lab Results  Component Value Date   LDLCALC 61 12/27/2023   Stable, pt to continue current statin lipitor 80 mg every day, zetia  10 mg every day, fenofibrate  160 qd

## 2024-05-17 NOTE — Assessment & Plan Note (Signed)
 Lab Results  Component Value Date   CREATININE 1.56 (H) 11/22/2023   Stable overall with one kidney, cont to avoid nephrotoxins

## 2024-05-17 NOTE — Assessment & Plan Note (Addendum)
 Lab Results  Component Value Date   VITAMINB12 267 11/22/2023   Low, to start injection replacement - b12 1000 mcg monthly

## 2024-05-17 NOTE — Assessment & Plan Note (Signed)
 With mild worsening symptoms, declines change in flomax  or urology referral, will let us  know

## 2024-05-17 NOTE — Assessment & Plan Note (Signed)
 Lab Results  Component Value Date   HGBA1C 5.7 11/22/2023   Stable, pt to continue current medical treatment  - diet, wt control

## 2024-05-17 NOTE — Assessment & Plan Note (Signed)
 BP Readings from Last 3 Encounters:  05/15/24 112/70  02/06/24 120/76  01/28/24 110/68   Stable, pt to continue medical treatment norvasc  5 1d, toprol  xl 150 qd

## 2024-05-17 NOTE — Assessment & Plan Note (Signed)
 Age and sex appropriate education and counseling updated with regular exercise and diet Referrals for preventative services - declines colonoscopy Immunizations addressed - for tdap at pharmacy Smoking counseling  - none needed Evidence for depression or other mood disorder - none significant Most recent labs reviewed. I have personally reviewed and have noted: 1) the patient's medical and social history 2) The patient's current medications and supplements 3) The patient's height, weight, and BMI have been recorded in the chart

## 2024-05-21 ENCOUNTER — Other Ambulatory Visit: Payer: Self-pay | Admitting: Internal Medicine

## 2024-05-21 ENCOUNTER — Other Ambulatory Visit (INDEPENDENT_AMBULATORY_CARE_PROVIDER_SITE_OTHER)

## 2024-05-21 ENCOUNTER — Ambulatory Visit: Payer: Self-pay | Admitting: Internal Medicine

## 2024-05-21 DIAGNOSIS — R7989 Other specified abnormal findings of blood chemistry: Secondary | ICD-10-CM | POA: Diagnosis not present

## 2024-05-21 DIAGNOSIS — E559 Vitamin D deficiency, unspecified: Secondary | ICD-10-CM | POA: Diagnosis not present

## 2024-05-21 DIAGNOSIS — R739 Hyperglycemia, unspecified: Secondary | ICD-10-CM

## 2024-05-21 DIAGNOSIS — Z125 Encounter for screening for malignant neoplasm of prostate: Secondary | ICD-10-CM

## 2024-05-21 DIAGNOSIS — E7800 Pure hypercholesterolemia, unspecified: Secondary | ICD-10-CM | POA: Diagnosis not present

## 2024-05-21 DIAGNOSIS — R972 Elevated prostate specific antigen [PSA]: Secondary | ICD-10-CM

## 2024-05-21 LAB — URINALYSIS, ROUTINE W REFLEX MICROSCOPIC
Bilirubin Urine: NEGATIVE
Hgb urine dipstick: NEGATIVE
Ketones, ur: NEGATIVE
Leukocytes,Ua: NEGATIVE
Nitrite: NEGATIVE
RBC / HPF: NONE SEEN (ref 0–?)
Specific Gravity, Urine: 1.02 (ref 1.000–1.030)
Total Protein, Urine: NEGATIVE
Urine Glucose: NEGATIVE
Urobilinogen, UA: 0.2 (ref 0.0–1.0)
pH: 6 (ref 5.0–8.0)

## 2024-05-21 LAB — HEPATIC FUNCTION PANEL
ALT: 20 U/L (ref 0–53)
AST: 26 U/L (ref 0–37)
Albumin: 4.3 g/dL (ref 3.5–5.2)
Alkaline Phosphatase: 74 U/L (ref 39–117)
Bilirubin, Direct: 0.2 mg/dL (ref 0.0–0.3)
Total Bilirubin: 0.6 mg/dL (ref 0.2–1.2)
Total Protein: 6.9 g/dL (ref 6.0–8.3)

## 2024-05-21 LAB — CBC WITH DIFFERENTIAL/PLATELET
Basophils Absolute: 0 K/uL (ref 0.0–0.1)
Basophils Relative: 0.8 % (ref 0.0–3.0)
Eosinophils Absolute: 0.1 K/uL (ref 0.0–0.7)
Eosinophils Relative: 2.7 % (ref 0.0–5.0)
HCT: 42.8 % (ref 39.0–52.0)
Hemoglobin: 14.6 g/dL (ref 13.0–17.0)
Lymphocytes Relative: 25.2 % (ref 12.0–46.0)
Lymphs Abs: 1.2 K/uL (ref 0.7–4.0)
MCHC: 34 g/dL (ref 30.0–36.0)
MCV: 90.2 fl (ref 78.0–100.0)
Monocytes Absolute: 0.5 K/uL (ref 0.1–1.0)
Monocytes Relative: 10.8 % (ref 3.0–12.0)
Neutro Abs: 2.8 K/uL (ref 1.4–7.7)
Neutrophils Relative %: 60.5 % (ref 43.0–77.0)
Platelets: 264 K/uL (ref 150.0–400.0)
RBC: 4.74 Mil/uL (ref 4.22–5.81)
RDW: 13.3 % (ref 11.5–15.5)
WBC: 4.7 K/uL (ref 4.0–10.5)

## 2024-05-21 LAB — BASIC METABOLIC PANEL WITH GFR
BUN: 26 mg/dL — ABNORMAL HIGH (ref 6–23)
CO2: 30 meq/L (ref 19–32)
Calcium: 10.3 mg/dL (ref 8.4–10.5)
Chloride: 101 meq/L (ref 96–112)
Creatinine, Ser: 1.42 mg/dL (ref 0.40–1.50)
GFR: 51.42 mL/min — ABNORMAL LOW (ref >60.00)
Glucose, Bld: 83 mg/dL (ref 70–99)
Potassium: 4.3 meq/L (ref 3.5–5.1)
Sodium: 139 meq/L (ref 135–145)

## 2024-05-21 LAB — VITAMIN B12: Vitamin B-12: 644 pg/mL (ref 211–911)

## 2024-05-21 LAB — LIPID PANEL
Cholesterol: 97 mg/dL (ref 0–200)
HDL: 49.2 mg/dL (ref >39.00)
LDL Cholesterol: 38 mg/dL (ref 0–99)
NonHDL: 47.72
Total CHOL/HDL Ratio: 2
Triglycerides: 51 mg/dL (ref 0.0–149.0)
VLDL: 10.2 mg/dL (ref 0.0–40.0)

## 2024-05-21 LAB — VITAMIN D 25 HYDROXY (VIT D DEFICIENCY, FRACTURES): VITD: 59.89 ng/mL (ref 30.00–100.00)

## 2024-05-21 LAB — TSH: TSH: 2.57 u[IU]/mL (ref 0.35–5.50)

## 2024-05-21 LAB — HEMOGLOBIN A1C: Hgb A1c MFr Bld: 6.2 % (ref 4.6–6.5)

## 2024-05-21 LAB — PSA: PSA: 3.96 ng/mL (ref 0.10–4.00)

## 2024-08-12 ENCOUNTER — Ambulatory Visit: Payer: Self-pay | Admitting: Internal Medicine

## 2024-08-12 ENCOUNTER — Other Ambulatory Visit

## 2024-08-12 DIAGNOSIS — R972 Elevated prostate specific antigen [PSA]: Secondary | ICD-10-CM

## 2024-08-12 LAB — PSA: PSA: 3.41 ng/mL (ref 0.10–4.00)

## 2024-08-13 ENCOUNTER — Other Ambulatory Visit: Payer: Self-pay

## 2024-08-13 MED ORDER — TAMSULOSIN HCL 0.4 MG PO CAPS: 0.4000 mg | ORAL_CAPSULE | Freq: Every day | ORAL | 3 refills | Status: AC

## 2024-08-18 ENCOUNTER — Other Ambulatory Visit: Payer: Self-pay | Admitting: Cardiology

## 2024-08-20 ENCOUNTER — Encounter: Payer: Self-pay | Admitting: Cardiology

## 2024-08-20 ENCOUNTER — Other Ambulatory Visit: Payer: Self-pay | Admitting: Cardiology

## 2024-08-20 MED ORDER — EZETIMIBE 10 MG PO TABS: 10.0000 mg | ORAL_TABLET | Freq: Every day | ORAL | 3 refills | Status: AC

## 2024-09-15 ENCOUNTER — Other Ambulatory Visit: Payer: Self-pay | Admitting: Internal Medicine

## 2024-09-16 ENCOUNTER — Other Ambulatory Visit: Payer: Self-pay

## 2024-11-23 ENCOUNTER — Ambulatory Visit: Admitting: Cardiology
